# Patient Record
Sex: Male | Born: 1965 | Race: Black or African American | Hispanic: No | Smoking: Light tobacco smoker
Health system: Southern US, Community
[De-identification: ages and names within clinical notes are randomized; demographics above are authoritative.]

## PROBLEM LIST (undated history)

## (undated) DIAGNOSIS — D509 Iron deficiency anemia, unspecified: Secondary | ICD-10-CM

## (undated) DIAGNOSIS — Z87442 Personal history of urinary calculi: Secondary | ICD-10-CM

## (undated) DIAGNOSIS — M1990 Unspecified osteoarthritis, unspecified site: Secondary | ICD-10-CM

## (undated) HISTORY — PX: JOINT REPLACEMENT: SHX530

## (undated) HISTORY — PX: COLONOSCOPY: SHX174

## (undated) HISTORY — PX: COSMETIC SURGERY: SHX468

---

## 2011-05-04 HISTORY — PX: OTHER SURGICAL HISTORY: SHX169

## 2013-05-21 HISTORY — PX: LAPAROSCOPIC GASTRIC SLEEVE RESECTION: SHX5895

## 2014-11-28 ENCOUNTER — Telehealth: Payer: Self-pay

## 2014-11-28 ENCOUNTER — Ambulatory Visit (INDEPENDENT_AMBULATORY_CARE_PROVIDER_SITE_OTHER): Payer: Medicare (Managed Care) | Admitting: Sports Medicine

## 2014-11-28 ENCOUNTER — Encounter: Payer: Self-pay | Admitting: Sports Medicine

## 2014-11-28 ENCOUNTER — Ambulatory Visit (INDEPENDENT_AMBULATORY_CARE_PROVIDER_SITE_OTHER): Payer: Medicare (Managed Care)

## 2014-11-28 VITALS — BP 129/77 | HR 98 | Ht 70.0 in | Wt 241.0 lb

## 2014-11-28 DIAGNOSIS — M1711 Unilateral primary osteoarthritis, right knee: Secondary | ICD-10-CM | POA: Diagnosis not present

## 2014-11-28 DIAGNOSIS — S76112S Strain of left quadriceps muscle, fascia and tendon, sequela: Secondary | ICD-10-CM | POA: Diagnosis not present

## 2014-11-28 DIAGNOSIS — S76112A Strain of left quadriceps muscle, fascia and tendon, initial encounter: Secondary | ICD-10-CM | POA: Insufficient documentation

## 2014-11-28 DIAGNOSIS — M170 Bilateral primary osteoarthritis of knee: Secondary | ICD-10-CM

## 2014-11-28 DIAGNOSIS — S83142A Lateral subluxation of proximal end of tibia, left knee, initial encounter: Secondary | ICD-10-CM | POA: Diagnosis not present

## 2014-11-28 DIAGNOSIS — S83141A Lateral subluxation of proximal end of tibia, right knee, initial encounter: Secondary | ICD-10-CM | POA: Diagnosis not present

## 2014-11-28 DIAGNOSIS — M1712 Unilateral primary osteoarthritis, left knee: Secondary | ICD-10-CM | POA: Diagnosis not present

## 2014-11-28 MED ORDER — NAPROXEN-ESOMEPRAZOLE 500-20 MG PO TBEC: 1.0000 | DELAYED_RELEASE_TABLET | Freq: Two times a day (BID) | ORAL | Status: DC

## 2014-11-28 NOTE — Telephone Encounter (Signed)
We discussed this in the office, he told me that his personal physician had given him an acid blocker, so that he would be able to take pain medication. That exactly what this is, a combination of the 2. Does he still not want to take it?

## 2014-11-28 NOTE — Assessment & Plan Note (Signed)
Poor range of motion with approximately 30 of extension lag. Also with a great deal of hip extension lag. He will need aggressive formal physical therapy.

## 2014-11-28 NOTE — Assessment & Plan Note (Signed)
X-rays, bilateral aspiration and injection. We will try Vimovo. Physical therapy. Return in a month, consider viscous supplementation if no better. 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

## 2014-11-28 NOTE — Progress Notes (Signed)
Subjective:    I'm seeing this patient as a consultation for:  [facility]  CC: "knee pain"  HPI:  Patient presents with c/o bilateral knee pain, inability to straighten his left knee, and worsening mobility since his quadriceps tendon repair in 2012. After the surgery the patient remained wheel chair bound for years. The patient also reports that he had weighed over 500 lbs but traveled to [place] to undergo sleeve gastrectomy. He now goes to they gym and does water aerobics but he has never had formal physical therapy. He is unable to stand upright and uses a cane to walk with much difficulty. About 1 year ago he had cortisone injected into his knees [place], but this did not relieve his pain. He believes the only way to improve his symptoms is to undergo bilateral knee replacement. However he is amenable to attempting non-surgical management first because he is planning to ride his motorcycle across the country this summer.   Past medical history, Surgical history, Family history not pertinant except as noted below, Social history, Allergies, and medications have been entered into the medical record, reviewed, and no changes needed.   Review of Systems: No headache, visual changes, nausea, vomiting, diarrhea, constipation, dizziness, abdominal pain, skin rash, fevers, chills, night sweats, weight loss, swollen lymph nodes, body aches, positive joint swelling, no muscle aches, chest pain, shortness of breath, mood changes, visual or auditory hallucinations.   Objective:   General: Well Developed, well nourished, and in no acute distress.  Neuro/Psych: Alert and oriented x3, extra-ocular muscles intact, able to move all 4 extremities, sensation grossly intact. Skin: Warm and dry, no rashes noted.  Respiratory: Not using accessory muscles, speaking in full sentences, trachea midline.  Cardiovascular: Pulses palpable, no extremity edema. Abdomen: Does not  appear distended. MSK:  Patient unable to stand fully erect due to limited ROM in left hip. Left Knee: No erythema, a small effusion is palpable. No obvious bony abnormalities. Palpation normal with no warmth, joint line tenderness, patellar tenderness, or condyle tenderness. ROM full in flexion but extension limited to 30 degrees. Non painful patellar compression. Patella alta. Patellar glide with audible and palpable crepitus. Patellar tendon unremarkable. Quadriceps tendon with firm mass palpable. Hamstring strength 5+, quadriceps strength 4+ with marked wasting of the quadriceps on ultrasnography.  Right Knee: No erythema, a large effusion is palpable. No obvious bony abnormalities. Palpation normal with no warmth, joint line tenderness, patellar tenderness, or condyle tenderness. ROM full in flexion and extension and lower leg rotation. Non painful patellar compression. Patella alta. Patellar glide with audible and palpable crepitus. Patellar and quadriceps tendons unremarkable. Hamstring and quadriceps strength is normal.   Procedure: Real-time Ultrasound Guided aspiration/Injection of right knee Device: GE Logiq E  Verbal informed consent obtained.  Time-out conducted.  Noted no overlying erythema, induration, or other signs of local infection.  Skin prepped in a sterile fashion.  Local anesthesia: Topical Ethyl chloride.  With sterile technique and under real time ultrasound guidance:  Aspirated 65 mL of straw-colored fluid, syringe switched and 2 mL kenalog 40, 4 mL lidocaine injected easily. Completed without difficulty  Pain immediately resolved suggesting accurate placement of the medication.  Advised to call if fevers/chills, erythema, induration, drainage, or persistent bleeding.  Images permanently stored and available for review in the ultrasound unit.  Impression: Technically successful ultrasound guided injection.  Procedure: Real-time Ultrasound Guided  aspiration/Injection of left knee Device: GE Logiq E  Verbal informed  consent obtained.  Time-out conducted.  Noted no overlying erythema, induration, or other signs of local infection.  Skin prepped in a sterile fashion.  Local anesthesia: Topical Ethyl chloride.  With sterile technique and under real time ultrasound guidance:  Noted in intact patellar tendon with patella also, there is also significant atrophy with fatty replacement of the quadriceps, repair appeared intact. I was able to aspirate 5 mL of straw-colored fluid, syringe switched and 2 mL kenalog 40, 4 mL lidocaine injected easily. Completed without difficulty  Pain immediately resolved suggesting accurate placement of the medication.  Advised to call if fevers/chills, erythema, induration, drainage, or persistent bleeding.  Images permanently stored and available for review in the ultrasound unit.  Impression: Technically successful ultrasound guided injection.  X-rays were reviewed and show severe tricompartmental osteoarthritis with lateral subluxation of the tibia as expected.  Impression and Recommendations:   This case required medical decision making of moderate complexity.  # Osteoarthritis  - Bilateral ultrasound guided fluid aspiration and triamcinolone injection performed today - Begin naproxen/esomeprazole 200/50 mg daily for pain control - Bilateral XR Knees to be obtained today - Consider Orthovisc injection  # Left quadriceps tendon rupture - S/p repair in 2012 and prolonged immobilization in wheelchair resulting in limited ROM - Pt will require formal PT, referral placed  Follow up in 4 weeks or sooner if needed

## 2014-11-28 NOTE — Telephone Encounter (Signed)
Patient called stated that the medication he was prescribed today(Naproxen-Esomeprazole) he cannot take because he has had a gastric sleeve and it messes with his stomach patient would like to know if there is something else that can be sent in to his pharmacy. Rhonda Cunningham,CMA

## 2014-12-01 NOTE — Telephone Encounter (Signed)
Spoke to patient and he stated that he has [employer] insurance and they will not cover the medication this is what [facility] was [facility] from [facility], they advised patient to take OTC aleve and acid blocker. Suhailah Kwan,CMA

## 2014-12-01 NOTE — Telephone Encounter (Signed)
Left message on patient mvm concerning this and advised patient to call me back. Rhonda Cunningham,CMA

## 2014-12-02 ENCOUNTER — Ambulatory Visit: Payer: Medicare Other | Admitting: Sports Medicine

## 2014-12-09 ENCOUNTER — Ambulatory Visit (INDEPENDENT_AMBULATORY_CARE_PROVIDER_SITE_OTHER): Payer: Medicare (Managed Care) | Admitting: Physical Therapy

## 2014-12-09 ENCOUNTER — Encounter: Payer: Self-pay | Admitting: Physical Therapy

## 2014-12-09 ENCOUNTER — Ambulatory Visit: Payer: Medicare Other | Admitting: Sports Medicine

## 2014-12-09 DIAGNOSIS — R293 Abnormal posture: Secondary | ICD-10-CM

## 2014-12-09 DIAGNOSIS — R269 Unspecified abnormalities of gait and mobility: Secondary | ICD-10-CM

## 2014-12-09 DIAGNOSIS — M6281 Muscle weakness (generalized): Secondary | ICD-10-CM

## 2014-12-09 DIAGNOSIS — M549 Dorsalgia, unspecified: Secondary | ICD-10-CM

## 2014-12-09 DIAGNOSIS — M2560 Stiffness of unspecified joint, not elsewhere classified: Secondary | ICD-10-CM

## 2014-12-09 NOTE — Patient Instructions (Signed)
On Elbows (Prone)   Rise up on elbows as high as possible, keeping hips on floor. Hold _3-10___ min. Repeat _1___ times per set. Do _1___ sets per session. Do _2___ sessions per day.  http://orth.exer.us/92   Copyright  VHI. All rights reserved.  Postural re-alignement routine per the [facility] method   Press upper body upward, keeping hips in contact with floor. Keep lower back and buttocks relaxed. Hold 30sec. Repeat _1___ times per set. Do _1___ sets per session. Do __2__ sessions per day  http://orth.exer.us/94   Copyright  VHI. All rights reserved.

## 2014-12-09 NOTE — Patient Instructions (Signed)
On Elbows (Prone)   Rise up on elbows as high as possible, keeping hips on floor. Hold _3-10___ min. Repeat _1___ times per set. Do _1___ sets per session. Do _2___ sessions per day.  http://orth.exer.us/92   Copyright  VHI. All rights reserved.  Postural re-alignement routine per the [facility] method   Press upper body upward, keeping hips in contact with floor. Keep lower back and buttocks relaxed. Hold 30sec. Repeat _1___ times per set. Do _1___ sets per session. Do __2__ sessions per day  http://orth.exer.us/94   Copyright  VHI. All rights reserved.

## 2014-12-09 NOTE — Therapy (Signed)
[facility] [place] [address] [place], [place], [address] Phone: [phone]   Fax:  [phone]  Physical Therapy Evaluation  Patient Details  Name: Jordan Bailey MRN: [id number] Date of Birth: 1965-10-17 Referring Provider:  Monica Becton,*  Encounter Date: 12/09/2014      PT End of Session - 12/09/14 1110    Visit Number 1   Number of Visits 16   Date for PT Re-Evaluation 02/03/15   PT Start Time 1020   PT Stop Time 1124   PT Time Calculation (min) 64 min   Activity Tolerance Patient tolerated treatment well      History reviewed. No pertinent past medical history.  Past Surgical History  Procedure Laterality Date  . 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 Reattached tendon Left 05/2011    LEFT LEG    There were no vitals taken for this visit.  Visit Diagnosis:  Abnormality of gait - Plan: PT plan of care cert/re-cert  Abnormal posture - Plan: PT plan of care cert/re-cert  Muscle weakness-general - Plan: PT plan of care cert/re-cert  Stiffness of multiple joints - Plan: PT plan of care cert/re-cert  Back pain, unspecified location - Plan: PT plan of care cert/re-cert      Subjective Assessment - 12/09/14 1020    Symptoms bilat knees are bone/bone, had a ruptured Lt quad tendon in 2012, had a repair 3 months later after retracted.  Never had PT after this surgery.  rides stationary bike, goes to the gym.    Pertinent History Had a lot of fluid removed from Rt knee.  He has been having issues since 2012 with injury. Was in [facility] for about 1-2 yrs, Weaned himself to using a cane.     How long can you sit comfortably? unlimited   Patient Stated Goals unable to squat, wishes to increase strength in his legs. Wants to target muscles that are atrophied to stand up straight and wishes to get rid of cane.    Currently in Pain? Yes  not real pain in his knees however the pain comes from his back due  to postural limitations.   Pain Score 2    Pain Location Back   Pain Descriptors / Indicators Aching;Dull   Pain Onset More than a month ago   Pain Frequency Intermittent   Aggravating Factors  standing and walkling, works  through the pain          [facility] PT Assessment - 12/09/14 0001    Assessment   Medical Diagnosis [name]   Onset Date 09/22/15   Next MD Visit 12/09/13   Prior Therapy no   Precautions   Precautions None   Balance Screen   Has the patient fallen in the past 6 months No   Has the patient had a decrease in activity level because of a fear of falling?  No   Is the patient reluctant to leave their home because of a fear of falling?  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    Observation/Other Assessments   Focus on Therapeutic Outcomes (FOTO)  51%   Posture/Postural Control   Posture/Postural Control Postural limitations   Postural Limitations Flexed trunk;Weight shift right;Rounded Shoulders;Forward head;Left pelvic obliquity;Increased  lumbar lordosis   ROM / Strength   AROM / PROM / Strength AROM;PROM;Strength   AROM   AROM Assessment Site Ankle;Knee;Hip   Right/Left Hip --  bilat hips tight in the hip flexors & single knee to chest   Right/Left Knee Left   Left Knee Extension -12   Left Knee Flexion 109   Right/Left Ankle --  bilateral ankle inversion to neutral, others WNL   PROM   PROM Assessment Site Hip;Knee   Right/Left Hip Right;Left   Right Hip External Rotation  30   Right Hip Internal Rotation  25   Left Hip External Rotation  35   Left Hip Internal Rotation  5   Right/Left Knee Left   Left Knee Extension -9   Left Knee Flexion 111   Strength   Overall Strength Comments multifidis poor,  transverse abdominis poor   Strength Assessment Site Hip   Right/Left Hip Right;Left   Right Hip Flexion 4+/5   Right Hip Extension 4-/5   Right Hip ABduction 4/5   Left Hip Flexion 4-/5   Left Hip Extension 3+/5   Left Hip ABduction 3+/5   Flexibility   Soft Tissue Assessment /Muscle Length yes   Palpation   Palpation tightness and scar tissue palpable in Lt quad   Ambulation/Gait   Ambulation/Gait Yes   Ambulation/Gait Assistance 6: Modified independent (Device/Increase time)   Assistive device Straight cane   Gait Pattern Decreased step length - left;Decreased stance time - left;Decreased stride length;Left circumduction;Left hip hike;Left flexed knee in stance;Antalgic;Lateral hip instability;Decreased trunk rotation;Trunk flexed                  [facility] Adult PT Treatment/Exercise - 12/09/14 0001    Exercises   Exercises Lumbar   Lumbar Exercises: Supine   Other Supine Lumbar Exercises postural re-alignment routine    Lumbar Exercises: Prone   Other Prone Lumbar Exercises lying prone for hip flex stetch, progress to POE, and press ups                PT Education - 12/09/14 1106    Education provided Yes   Education Details HEP   Person(s) Educated Patient   Methods Explanation;Demonstration;Handout   Comprehension Returned demonstration;Verbal cues required;Tactile cues required          PT Short Term Goals - 12/09/14 1142    PT SHORT TERM GOAL #1   Title I with initial HEP   Time 4   Period Weeks   Status New   PT SHORT TERM GOAL #2   Title increase Lt hip ROM to WNL    Time 4   Period Weeks   Status New   PT SHORT TERM GOAL #3   Title increase Rt hip extension to WNL   Time 4   Period Weeks   Status New   PT SHORT TERM GOAL #4   Title ambulate keeping the Lt LE straight and not need the cane   Time 4   Period Weeks   Status New   PT SHORT TERM GOAL #5   Title improve FOTO =/< 30%    Time 4   Period Weeks   Status New            PT Long Term Goals - 12/09/14 1144    PT LONG TERM GOAL #1   Title I with advanced HEP to include core work   Time 8   Period Weeks   Status New  PT LONG TERM GOAL #2   Title demonstrate bilat hip strength =/> 4+/5   Time 8   Period Weeks   Status New   PT LONG TERM GOAL #3   Title report back pain decrease =/> 50% with daily activity   Time 8   Period Weeks   Status New   PT LONG TERM GOAL #4   Title ambulate with upright posture    Time 8   Period Weeks   Status New   PT LONG TERM GOAL #5   Title improve FOTO =/< 21%    Time 8   Period Weeks   Status New   Additional Long Term Goals   Additional Long Term Goals Yes   PT LONG TERM GOAL #6   Title perform lifting up to 15# waist to head level without difficulty   Time 8   Period Weeks   Status New               Plan - 12/09/14 1116    Clinical Impression Statement Patient presents with multiple postural  changes and gait abnormalities, has weakness and tightness in his core and hips that are causing gait abnormalities   Pt will benefit from skilled therapeutic intervention in order to improve on the following deficits Abnormal gait;Difficulty walking;Impaired flexibility;Improper body mechanics;Postural dysfunction;Pain;Decreased balance;Decreased strength   Rehab Potential Good   PT Frequency 2x / week   PT Duration 8 weeks   PT Treatment/Interventions Moist Heat;Therapeutic activities;Patient/family education;Passive range of motion;Therapeutic exercise;DME Instruction;Ultrasound;Gait training;Balance training;Manual techniques;Stair training;Cryotherapy;Electrical Stimulation;Functional mobility training   PT Next Visit Plan assess tolerance to HEP, manual work to Lt hip and knee, postural re-alignment exercise   Consulted and Agree with Plan of Care Patient         Problem List Patient Active Problem List   Diagnosis Date Noted  . Osteoarthritis of both knees 11/28/2014  . Rupture of left  quadriceps muscle post repair 11/28/2014    Roderic ScarceSusan Calea Hribar. PT 12/09/2014, 11:50 AM  [facility] [address] [place], [place], [address] Phone: [phone]   Fax:  [phone]

## 2014-12-11 ENCOUNTER — Ambulatory Visit (INDEPENDENT_AMBULATORY_CARE_PROVIDER_SITE_OTHER): Payer: Medicare (Managed Care) | Admitting: Physical Therapy

## 2014-12-11 ENCOUNTER — Encounter: Payer: Self-pay | Admitting: Sports Medicine

## 2014-12-11 ENCOUNTER — Ambulatory Visit (INDEPENDENT_AMBULATORY_CARE_PROVIDER_SITE_OTHER): Payer: Medicare (Managed Care) | Admitting: Sports Medicine

## 2014-12-11 VITALS — BP 128/80 | HR 78 | Ht 71.0 in | Wt 238.0 lb

## 2014-12-11 DIAGNOSIS — M2042 Other hammer toe(s) (acquired), left foot: Secondary | ICD-10-CM

## 2014-12-11 DIAGNOSIS — M2560 Stiffness of unspecified joint, not elsewhere classified: Secondary | ICD-10-CM | POA: Diagnosis not present

## 2014-12-11 DIAGNOSIS — M1612 Unilateral primary osteoarthritis, left hip: Secondary | ICD-10-CM

## 2014-12-11 DIAGNOSIS — R293 Abnormal posture: Secondary | ICD-10-CM

## 2014-12-11 DIAGNOSIS — R269 Unspecified abnormalities of gait and mobility: Secondary | ICD-10-CM

## 2014-12-11 DIAGNOSIS — M6281 Muscle weakness (generalized): Secondary | ICD-10-CM

## 2014-12-11 DIAGNOSIS — M170 Bilateral primary osteoarthritis of knee: Secondary | ICD-10-CM | POA: Diagnosis not present

## 2014-12-11 DIAGNOSIS — M549 Dorsalgia, unspecified: Secondary | ICD-10-CM

## 2014-12-11 MED ORDER — NAPROXEN 500 MG PO TABS: 500.0000 mg | ORAL_TABLET | Freq: Two times a day (BID) | ORAL | Status: DC

## 2014-12-11 MED ORDER — OMEPRAZOLE 40 MG PO CPDR: 40.0000 mg | DELAYED_RELEASE_CAPSULE | Freq: Two times a day (BID) | ORAL | Status: DC

## 2014-12-11 NOTE — Therapy (Signed)
[facility] [address] [place], [place], [address] Phone: [phone]   Fax:  [phone]  Physical Therapy Treatment  Patient Details  Name: Jordan Bailey MRN: [id number] Date of Birth: 03/12/1966 Referring Provider:  Monica Bectonhekkekandam, Thomas J,*  Encounter Date: 12/11/2014      PT End of Session - 12/11/14 1245    Visit Number 2   Number of Visits 16   Date for PT Re-Evaluation 02/03/15   PT Start Time 1150   PT Stop Time 1230   PT Time Calculation (min) 40 min   Activity Tolerance Patient tolerated treatment well   Behavior During Therapy [facility] for tasks assessed/performed      No past medical history on file.  Past Surgical History  Procedure Laterality Date  . 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 Reattached tendon Left 05/2011    LEFT LEG    There were no vitals filed for this visit.  Visit Diagnosis:  Abnormality of gait  Abnormal posture  Stiffness of multiple joints  Muscle weakness-general  Back pain, unspecified location      Subjective Assessment - 12/11/14 1153    Symptoms feels good today                       [facility] Adult PT Treatment/Exercise - 12/11/14 1155    Lumbar Exercises: Stretches   Prone on Elbows Stretch 3 reps;60 seconds   Press Ups Other (comment)   Press Ups Limitations 10 reps x 5 sec; cues to keep hips on mat for hip flexor stretch   Knee/Hip Exercises: Stretches   Passive Hamstring Stretch 3 reps;30 seconds  with strap   Knee/Hip Exercises: Aerobic   Stationary Bike NuStep Level 4 x 5 min   Knee/Hip Exercises: Supine   Quad Sets Left;15 reps   Quad Sets Limitations with heel prop and mod cues for quad activation   Manual Therapy   Manual Therapy Joint mobilization   Joint Mobilization L femur A/P glides grades 2-3 for knee ROM; hip internal rotation for er stretch; and L hip distraction                 PT Education - 12/11/14  1245    Education provided Yes   Education Details HEP   Person(s) Educated Patient   Methods Explanation;Demonstration;Handout;Tactile cues;Verbal cues   Comprehension Verbalized understanding;Returned demonstration;Need further instruction          PT Short Term Goals - 12/11/14 1247    PT SHORT TERM GOAL #1   Title I with initial HEP   Time 4   Period Weeks   Status On-going   PT SHORT TERM GOAL #2   Title increase Lt hip ROM to WNL    Time 4   Period Weeks   Status On-going   PT SHORT TERM GOAL #3   Title increase Rt hip extension to WNL   Time 4   Period Weeks   Status On-going   PT SHORT TERM GOAL #4   Title ambulate keeping the Lt LE straight and not need the cane   Time 4   Period Weeks   Status On-going   PT SHORT TERM GOAL #5   Title improve FOTO =/< 30%    Time 4   Period Weeks   Status On-going           PT Long Term Goals - 12/11/14 1247    PT LONG TERM GOAL #  1   Title I with advanced HEP to include core work   Time 8   Period Weeks   Status On-going   PT LONG TERM GOAL #2   Title demonstrate bilat hip strength =/> 4+/5   Time 8   Period Weeks   Status On-going   PT LONG TERM GOAL #3   Title report back pain decrease =/> 50% with daily activity   Time 8   Period Weeks   Status On-going   PT LONG TERM GOAL #4   Title ambulate with upright posture    Time 8   Period Weeks   Status On-going   PT LONG TERM GOAL #5   Title improve FOTO =/< 21%    Time 8   Period Weeks   Status On-going   PT LONG TERM GOAL #6   Title perform lifting up to 15# waist to head level without difficulty   Time 8   Period Weeks   Status On-going               Plan - 12/11/14 1246    Clinical Impression Statement Pt reports not doing HEP provided at last visit.  Added additional stretches and quad sets to HEP and reinforced need for compliance.     PT Next Visit Plan review new HEP; continue manual and strengthening   Consulted and Agree with Plan  of Care Patient        Problem List Patient Active Problem List   Diagnosis Date Noted  . Osteoarthritis of both knees 11/28/2014  . Rupture of left quadriceps muscle post repair 11/28/2014   Clarita Crane, PT, DPT 12/11/2014 12:49 PM  [facility] [address] [place], [place], [address] Phone: [phone]   Fax:  [phone]

## 2014-12-11 NOTE — Assessment & Plan Note (Signed)
With rigid flexion deformity at the left second proximal interphalangeal joint. Referral to podiatry, he is getting ulceration on the dorsal aspect.

## 2014-12-11 NOTE — Patient Instructions (Signed)
Hamstring Step 1   Straighten left knee. Keep knee level with other knee or on bolster. Hold _30__ seconds. Relax knee by returning foot to start. Repeat _2-3_ times.  Copyright  VHI. All rights reserved.    Internal Rotation: ROM In Extension (Supine)   Position (A) Helper: Place hands on top of left leg. Motion (B) - Roll entire leg inward toward center of body. - Knee remains straight. Repeat _5-10__ times. Repeat with other leg. Do _2-3__ sessions per day. Variation: Patient is passive.  Copyright  VHI. All rights reserved.    ROM: Heel Prop   Lie with pillow under right heel. Tighten the muscles on the top of leg while trying to push knee toward the floor. Hold __5__ seconds. Repeat _10___ times. Do _2-3___ sessions per day.  http://gt2.exer.us/287   Copyright  VHI. All rights reserved.   Clarita CraneStephanie F Krystal Teachey, PT, DPT 12/11/2014 12:20 PM  [facility]  [address] [place], [place] [address]  [phone] (office) [phone] (fax)

## 2014-12-11 NOTE — Assessment & Plan Note (Signed)
We can inject this in the future if needed.

## 2014-12-11 NOTE — Assessment & Plan Note (Signed)
Overall doing well with physical therapy and gaining range of motion. Unable to obtain Vimovo, we are going to simply switch this to naproxen and omeprazole as separate prescriptions.

## 2014-12-11 NOTE — Progress Notes (Signed)
  Subjective:    CC: Follow-up  HPI: Bilateral knee osteoarthritis: Pain-free, working on his range of motion and strength with physical therapy.  Left hip pain: Localized in the groin, worse after periods of inactivity, moderate, persistent, does not desire interventional treatment today.  Hammertoe: Left second, present for years, getting ulceration on the dorsum of the proximal interphalangeal joint, wonders what can be done.  Past medical history, Surgical history, Family history not pertinant except as noted below, Social history, Allergies, and medications have been entered into the medical record, reviewed, and no changes needed.   Review of Systems: No fevers, chills, night sweats, weight loss, chest pain, or shortness of breath.   Objective:    General: Well Developed, well nourished, and in no acute distress.  Neuro: Alert and oriented x3, extra-ocular muscles intact, sensation grossly intact.  HEENT: Normocephalic, atraumatic, pupils equal round reactive to light, neck supple, no masses, no lymphadenopathy, thyroid nonpalpable.  Skin: Warm and dry, no rashes. Cardiac: Regular rate and rhythm, no murmurs rubs or gallops, no lower extremity edema.  Respiratory: Clear to auscultation bilaterally. Not using accessory muscles, speaking in full sentences. Left Hip: ROM IR: 20 Deg, ER: 60 Deg, Flexion: 120 Deg, Extension: 100 Deg, Abduction: 45 Deg, Adduction: 45 Deg. Pain with Internal rotation. Strength IR: 5/5, ER: 5/5, Flexion: 5/5, Extension: 5/5, Abduction: 5/5, Adduction: 5/5 Pelvic alignment unremarkable to inspection and palpation. Standing hip rotation and gait without trendelenburg / unsteadiness. Greater trochanter without tenderness to palpation. No tenderness over piriformis. No SI joint tenderness and normal minimal SI movement. Left Foot: No visible erythema or swelling. Range of motion is full in all directions. Strength is 5/5 in all directions. No hallux  valgus. Minimal pes planus Abnormal callus noted on the dorsum of the left second proximal interphalangeal joint due to a hammertoe, hammertoe is rigid, and I am unable to passively extend at the PIP. No pain over the navicular prominence, or base of fifth metatarsal. No tenderness to palpation of the calcaneal insertion of plantar fascia. No pain at the Achilles insertion. No pain over the calcaneal bursa. No pain of the retrocalcaneal bursa. No hallux rigidus or limitus. No pain with compression of the metatarsal heads. Neurovascularly intact distally.  Impression and Recommendations:

## 2014-12-15 ENCOUNTER — Ambulatory Visit (INDEPENDENT_AMBULATORY_CARE_PROVIDER_SITE_OTHER): Payer: Medicare (Managed Care) | Admitting: Physical Therapy

## 2014-12-15 DIAGNOSIS — R269 Unspecified abnormalities of gait and mobility: Secondary | ICD-10-CM | POA: Diagnosis not present

## 2014-12-15 DIAGNOSIS — M2560 Stiffness of unspecified joint, not elsewhere classified: Secondary | ICD-10-CM

## 2014-12-15 DIAGNOSIS — M6281 Muscle weakness (generalized): Secondary | ICD-10-CM

## 2014-12-15 DIAGNOSIS — R293 Abnormal posture: Secondary | ICD-10-CM

## 2014-12-15 NOTE — Patient Instructions (Signed)
Pt issued paper copy of Knee extension stretches and knee flexion stretches.   Pt is to perform prone knee flexion with strap (to stretch hip and knee) 30 sec, 2 sets, 2x/day.   Pt can also try prone knee hang, quad sets with foot propped on chair to encourage increased extension.

## 2014-12-15 NOTE — Therapy (Signed)
[place], [place], [address] Phone: [phone]   Fax:  [phone]  Physical Therapy Treatment  Patient Details  Name: Jordan Bailey MRN: [id number] Date of Birth: May 14, 1966 Referring Provider:  Silverio Decamp,*  Encounter Date: 12/15/2014      PT End of Session - 12/15/14 1201    Visit Number 3   Number of Visits 16   Date for PT Re-Evaluation 02/03/15   PT Start Time 1200   PT Stop Time 1250   PT Time Calculation (min) 50 min   Activity Tolerance Patient tolerated treatment well      No past medical history on file.  Past Surgical History  Procedure Laterality Date  . 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 Reattached tendon Left 05/2011    LEFT LEG    There were no vitals filed for this visit.  Visit Diagnosis:  Abnormality of gait  Abnormal posture  Stiffness of multiple joints  Muscle weakness-general      Subjective Assessment - 12/15/14 1202    Symptoms Nothing new to report.  Pt reported doing the exercises each day (HS stretch, QS, press ups).    Currently in Pain? No/denies            [facility] PT Assessment - 12/15/14 0001    Assessment   Medical Diagnosis [name]   Onset Date 09/22/15   Next MD Visit 12/25/14   Precautions   Precautions None                   [facility] Adult PT Treatment/Exercise - 12/15/14 0001    Ambulation/Gait   Ambulation/Gait Yes   Ambulation/Gait Assistance 6: Modified independent (Device/Increase time)   Ambulation Distance (Feet) 160 Feet   Assistive device Straight cane   Gait Pattern Step-through pattern;Decreased step length - right;Left flexed knee in stance;Antalgic;Trunk flexed   Gait Comments VC to even step length and increase upright posture with Lt stance. Tactile cues to increase upright posture.   Exercises   Exercises Lumbar;Knee/Hip   Lumbar Exercises: Stretches   Passive  Hamstring Stretch 2 reps;30 seconds   Hip Flexor Stretch 5 reps;30 seconds  3 reps standing, hips forward; 2 reps prone   Hip Flexor Stretch Limitations L knee to 90 deg    Press Ups 2 reps;10 seconds   Lumbar Exercises: Aerobic   Stationary Bike NuStep level 5- x 5 min   Lumbar Exercises: Standing   Heel Raises 15 reps  Rocking toes up/heels up   Other Standing Lumbar Exercises --   Lumbar Exercises: Seated   Long Arc Quad on Chair Strengthening;Left;15 reps  eccentric control 4 sec   LAQ on Chair Limitations not full ext.    Lumbar Exercises: Prone   Other Prone Lumbar Exercises Prone on elbows x 1 min.    Other Prone Lumbar Exercises --   Knee/Hip Exercises: Stretches   Gastroc Stretch 2 reps;30 seconds   Knee/Hip Exercises: Standing   Other Standing Knee Exercises sit to stand with weight shifting Lt with single UE assistance x 15 reps   Other Standing Knee Exercises Weight shifts R<>L; up to 75% weight into LLE (scale under Lt foot to measure) x 15. Tactile cues for knee ext and upright posture in Lt stance.    Knee/Hip Exercises: Prone   Prone Knee Hang 2 minutes   Prone Knee Hang Weights (lbs) --  Opposite leg on top   Other Prone  Exercises TKE with 5 sec holds x 12 reps                 PT Education - 12/15/14 1253    Education provided Yes   Education Details HEP    Person(s) Educated Patient   Methods Explanation;Demonstration;Handout   Comprehension Verbalized understanding;Returned demonstration          PT Short Term Goals - 12/11/14 1247    PT SHORT TERM GOAL #1   Title I with initial HEP   Time 4   Period Weeks   Status On-going   PT SHORT TERM GOAL #2   Title increase Lt hip ROM to WNL    Time 4   Period Weeks   Status On-going   PT SHORT TERM GOAL #3   Title increase Rt hip extension to WNL   Time 4   Period Weeks   Status On-going   PT SHORT TERM GOAL #4   Title ambulate keeping the Lt LE straight and not need the cane   Time 4    Period Weeks   Status On-going   PT SHORT TERM GOAL #5   Title improve FOTO =/< 30%    Time 4   Period Weeks   Status On-going           PT Long Term Goals - 12/11/14 1247    PT LONG TERM GOAL #1   Title I with advanced HEP to include core work   Time 8   Period Weeks   Status On-going   PT LONG TERM GOAL #2   Title demonstrate bilat hip strength =/> 4+/5   Time 8   Period Weeks   Status On-going   PT LONG TERM GOAL #3   Title report back pain decrease =/> 50% with daily activity   Time 8   Period Weeks   Status On-going   PT LONG TERM GOAL #4   Title ambulate with upright posture    Time 8   Period Weeks   Status On-going   PT LONG TERM GOAL #5   Title improve FOTO =/< 21%    Time 8   Period Weeks   Status On-going   PT LONG TERM GOAL #6   Title perform lifting up to 15# waist to head level without difficulty   Time 8   Period Weeks   Status On-going               Plan - 12/15/14 1459    Clinical Impression Statement Pt reports increased compliance with HEP. Pt tolerated all exercises well and no increase in symptoms. Pt continues with poor Lt quad contraction,decreased Lt knee ROM, and tight Lt hip flexor. No goals met secondary to number of visits.    Pt will benefit from skilled therapeutic intervention in order to improve on the following deficits Abnormal gait;Difficulty walking;Impaired flexibility;Improper body mechanics;Postural dysfunction;Pain;Decreased balance;Decreased strength   Rehab Potential Good   PT Frequency 2x / week   PT Duration 8 weeks   PT Treatment/Interventions Moist Heat;Therapeutic activities;Patient/family education;Passive range of motion;Therapeutic exercise;DME Instruction;Ultrasound;Gait training;Balance training;Manual techniques;Stair training;Cryotherapy;Electrical Stimulation;Functional mobility training   PT Next Visit Plan Continue per plan of care.    Consulted and Agree with Plan of Care Patient         Problem List Patient Active Problem List   Diagnosis Date Noted  . Hammer toe of left foot 12/11/2014  . Osteoarthritis of left hip 12/11/2014  . Osteoarthritis of  both knees 11/28/2014  . Rupture of left quadriceps muscle post repair 11/28/2014   Kerin Perna, PTA 12/15/2014 3:03 PM  [place], [place], [address] Phone: [phone]   Fax:  [phone]

## 2014-12-18 ENCOUNTER — Ambulatory Visit (INDEPENDENT_AMBULATORY_CARE_PROVIDER_SITE_OTHER): Payer: Medicare (Managed Care) | Admitting: Physical Therapy

## 2014-12-18 DIAGNOSIS — R293 Abnormal posture: Secondary | ICD-10-CM

## 2014-12-18 DIAGNOSIS — R269 Unspecified abnormalities of gait and mobility: Secondary | ICD-10-CM | POA: Diagnosis not present

## 2014-12-18 DIAGNOSIS — M2560 Stiffness of unspecified joint, not elsewhere classified: Secondary | ICD-10-CM

## 2014-12-18 DIAGNOSIS — M6281 Muscle weakness (generalized): Secondary | ICD-10-CM

## 2014-12-18 DIAGNOSIS — M549 Dorsalgia, unspecified: Secondary | ICD-10-CM | POA: Diagnosis not present

## 2014-12-18 NOTE — Therapy (Signed)
[facility] [place] [address] [place], [place], [address] Phone: [phone]   Fax:  [phone]  Physical Therapy Treatment  Patient Details  Name: Jordan Bailey MRN: [id number] Date of Birth: [birth year] Referring Provider:  Monica Becton,*  Encounter Date: 12/18/2014      PT End of Session - 12/18/14 1153    Visit Number 4   Number of Visits 16   Date for PT Re-Evaluation 02/03/15   PT Start Time 1150   PT Stop Time 1240   PT Time Calculation (min) 50 min      No past medical history on file.  Past Surgical History  Procedure Laterality Date  . 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 Reattached tendon Left 05/2011    LEFT LEG    There were no vitals filed for this visit.  Visit Diagnosis:  Muscle weakness-general  Abnormality of gait  Stiffness of multiple joints  Back pain, unspecified location  Abnormal posture      Subjective Assessment - 12/18/14 1153    Symptoms Pt reports starting to feel a little more pain in knees lately   Currently in Pain? Yes   Pain Score 2    Pain Location Knee   Pain Orientation --  bilateral    Pain Descriptors / Indicators Dull   Aggravating Factors  standing and walking    Pain Relieving Factors rest            [facility] PT Assessment - 12/18/14 0001    Assessment   Medical Diagnosis [name]   Onset Date 09/22/15   Next MD Visit 12/25/14   Precautions   Precautions None                   [facility] Adult PT Treatment/Exercise - 12/18/14 0001    Exercises   Exercises Lumbar;Knee/Hip   Lumbar Exercises: Stretches   Prone on Elbows Stretch 3 reps;60 seconds   Lumbar Exercises: Standing   Forward Lunge --  with foot on 13"step to stretch hip flexor x 2 reps. Lt knee buckled 1x with stance.    Lumbar Exercises: Sidelying   Clam 10 reps bilat   Hip Abduction 15 reps bilat.  Difficult on Lt side.    Lumbar Exercises: Prone   Straight Leg Raise 15 reps  very challenging   Knee/Hip Exercises: Stretches   Quad Stretch 3 reps;60 seconds  LLE   ITB Stretch 2 reps;60 seconds   Gastroc Stretch 2 reps;20 seconds   Knee/Hip Exercises: Aerobic   Stationary Bike NuStep level 4- 5 min    Knee/Hip Exercises: Standing   Heel Raises 15 reps. VC to decrease UE assist.    Terminal Knee Extension Strengthening;Left;1 set;15 reps   Theraband Level (Terminal Knee Extension) Level 3 (Green)   Other Standing Knee Exercises weight shifts Lt to Rt VC to tighten Lt thigh. Tactile cues to truck for upright posture.                   PT Short Term Goals - 12/11/14 1247    PT SHORT TERM GOAL #1   Title I with initial HEP   Time 4   Period Weeks   Status On-going   PT SHORT TERM GOAL #2   Title increase Lt hip ROM to WNL    Time 4   Period Weeks   Status On-going   PT SHORT TERM GOAL #3   Title increase Rt hip  extension to WNL   Time 4   Period Weeks   Status On-going   PT SHORT TERM GOAL #4   Title ambulate keeping the Lt LE straight and not need the cane   Time 4   Period Weeks   Status On-going   PT SHORT TERM GOAL #5   Title improve FOTO =/< 30%    Time 4   Period Weeks   Status On-going           PT Long Term Goals - 12/11/14 1247    PT LONG TERM GOAL #1   Title I with advanced HEP to include core work   Time 8   Period Weeks   Status On-going   PT LONG TERM GOAL #2   Title demonstrate bilat hip strength =/> 4+/5   Time 8   Period Weeks   Status On-going   PT LONG TERM GOAL #3   Title report back pain decrease =/> 50% with daily activity   Time 8   Period Weeks   Status On-going   PT LONG TERM GOAL #4   Title ambulate with upright posture    Time 8   Period Weeks   Status On-going   PT LONG TERM GOAL #5   Title improve FOTO =/< 21%    Time 8   Period Weeks   Status On-going   PT LONG TERM GOAL #6   Title perform lifting up to 15# waist to head level without difficulty   Time  8   Period Weeks   Status On-going               Plan - 12/18/14 1255    Clinical Impression Statement Pt tolerated new exercises well. Pt very challenged with SLR in hip ext and hip abd bilaterally. Making slow progress towards goals. Pt remains motivated to progress towards established goals.    Pt will benefit from skilled therapeutic intervention in order to improve on the following deficits Abnormal gait;Difficulty walking;Impaired flexibility;Improper body mechanics;Postural dysfunction;Pain;Decreased balance;Decreased strength   Rehab Potential Good   PT Frequency 2x / week   PT Duration 8 weeks   PT Treatment/Interventions Moist Heat;Therapeutic activities;Patient/family education;Passive range of motion;Therapeutic exercise;DME Instruction;Ultrasound;Gait training;Balance training;Manual techniques;Stair training;Cryotherapy;Electrical Stimulation;Functional mobility training   PT Next Visit Plan Progress HEP, continue per plan of care.    Consulted and Agree with Plan of Care Patient        Problem List Patient Active Problem List   Diagnosis Date Noted  . Hammer toe of left foot 12/11/2014  . Osteoarthritis of left hip 12/11/2014  . Osteoarthritis of both knees 11/28/2014  . Rupture of left quadriceps muscle post repair 11/28/2014    Mayer CamelJennifer Carlson-Long, PTA 12/18/2014 1:00 PM  [facility] [place] [address] [place], [place], [address] Phone: [phone]   Fax:  [phone]

## 2014-12-22 ENCOUNTER — Encounter: Payer: Self-pay | Admitting: Physical Therapy

## 2014-12-22 ENCOUNTER — Ambulatory Visit (INDEPENDENT_AMBULATORY_CARE_PROVIDER_SITE_OTHER): Payer: Medicare (Managed Care) | Admitting: Physical Therapy

## 2014-12-22 DIAGNOSIS — R293 Abnormal posture: Secondary | ICD-10-CM

## 2014-12-22 DIAGNOSIS — M2560 Stiffness of unspecified joint, not elsewhere classified: Secondary | ICD-10-CM | POA: Diagnosis not present

## 2014-12-22 DIAGNOSIS — M6281 Muscle weakness (generalized): Secondary | ICD-10-CM

## 2014-12-22 NOTE — Therapy (Signed)
[facility] [place] [address] [place], [place], [address] Phone: [phone]   Fax:  [phone]  Physical Therapy Treatment  Patient Details  Name: Jordan Bailey MRN: [id number] Date of Birth: 01/04/1966 Referring Provider:  Monica Bectonhekkekandam, Thomas J,*  Encounter Date: 12/22/2014      PT End of Session - 12/22/14 1234    Visit Number 5   Number of Visits 16   Date for PT Re-Evaluation 02/03/15   PT Start Time 1150   PT Stop Time 1234   PT Time Calculation (min) 44 min   Activity Tolerance Patient tolerated treatment well      History reviewed. No pertinent past medical history.  Past Surgical History  Procedure Laterality Date  . 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 Reattached tendon Left 05/2011    LEFT LEG    There were no vitals filed for this visit.  Visit Diagnosis:  Muscle weakness-general  Stiffness of multiple joints  Abnormal posture      Subjective Assessment - 12/22/14 1151    Symptoms Pt reports he can tell PT is helping he was able to get his socks on easier and he is trying to keep his feet straighter with walking, has incresaed pain in stairs doing this though   Patient Stated Goals unable to squat, wishes to increase strength in his legs. Wants to target muscles that are atrophied to stand up straight and wishes to get rid of cane.    Currently in Pain? No/denies                       [facility] - 12/22/14 0001    Lumbar Exercises: Aerobic   Stationary Bike NuStep level 5- x 5 min   Knee/Hip Exercises: Stretches   Gastroc Stretch 30 seconds  then prostretch x 30 secs bilat   Gastroc Stretch Limitations ver difficult wiht Lt side on prostretch.    Knee/Hip Exercises: Aerobic   Stationary Bike NuStep level 5- 5 min    Knee/Hip Exercises: Standing   Step Down Step Height: 2";10 reps;3 sets  leading with Lt LE, bilat HHA assist VC's for form    SLS on Lt as tolerates with HHA PRN.    Other Standing Knee Exercises standing at high table, bringing hips to mat and back, VCs to keep weight even on each foot.    Knee/Hip Exercises: Supine   Other Supine Knee Exercises leg lengtheners,x 15.    Manual Therapy   Manual Therapy Joint mobilization;Myofascial release;Passive ROM   Joint Mobilization Lt hip, PA mobs grade III, IR mobs grade III manual traction of bilat hips.    Passive ROM IR Lt hip in prone                  PT Short Term Goals - 12/22/14 1243    PT SHORT TERM GOAL #1   Title I with initial HEP   Status Achieved   PT SHORT TERM GOAL #2   Title increase Lt hip ROM to WNL    Status On-going   PT SHORT TERM GOAL #3   Title increase Rt hip extension to WNL   Status On-going   PT SHORT TERM GOAL #4   Title ambulate keeping the Lt LE straight and not need the cane   Status On-going   PT SHORT TERM GOAL #5   Title improve FOTO =/< 30%    Status On-going  PT Long Term Goals - 12/22/14 1244    PT LONG TERM GOAL #1   Title I with advanced HEP to include core work   Status On-going   PT LONG TERM GOAL #2   Title demonstrate bilat hip strength =/> 4+/5   Status On-going   PT LONG TERM GOAL #3   Title report back pain decrease =/> 50% with daily activity   Status On-going   PT LONG TERM GOAL #4   Title ambulate with upright posture    Status On-going   PT LONG TERM GOAL #5   Title improve FOTO =/< 21%    Status On-going   PT LONG TERM GOAL #6   Title perform lifting up to 15# waist to head level without difficulty   Status On-going               Plan - 12/22/14 1241    Clinical Impression Statement Pt is beginning to see functional changes with his ADLs.  Has poor habits in regards to use of Lt LE along with weakness, tightness and pain at times.    Pt will benefit from skilled therapeutic intervention in order to improve on the following deficits Abnormal gait;Difficulty  walking;Impaired flexibility;Improper body mechanics;Postural dysfunction;Pain;Decreased balance;Decreased strength   Rehab Potential Good   PT Frequency 2x / week   PT Duration 8 weeks   PT Treatment/Interventions Moist Heat;Therapeutic activities;Patient/family education;Passive range of motion;Therapeutic exercise;DME Instruction;Ultrasound;Gait training;Balance training;Manual techniques;Stair training;Cryotherapy;Electrical Stimulation;Functional mobility training   PT Next Visit Plan manual therapy Lt hip and knee, increase weight bearing confidence in Lt LE   Consulted and Agree with Plan of Care Patient        Problem List Patient Active Problem List   Diagnosis Date Noted  . Hammer toe of left foot 12/11/2014  . Osteoarthritis of left hip 12/11/2014  . Osteoarthritis of both knees 11/28/2014  . Rupture of left quadriceps muscle post repair 11/28/2014    Roderic Scarce, PT 12/22/2014, 12:45 PM  [facility] [address] [place], [place], [address] Phone: [phone]   Fax:  [phone]

## 2014-12-23 ENCOUNTER — Ambulatory Visit (INDEPENDENT_AMBULATORY_CARE_PROVIDER_SITE_OTHER): Payer: Medicare (Managed Care) | Admitting: Podiatry

## 2014-12-23 ENCOUNTER — Ambulatory Visit (INDEPENDENT_AMBULATORY_CARE_PROVIDER_SITE_OTHER): Payer: Medicare (Managed Care)

## 2014-12-23 ENCOUNTER — Encounter: Payer: Self-pay | Admitting: Podiatry

## 2014-12-23 VITALS — BP 118/68 | HR 59 | Resp 16

## 2014-12-23 DIAGNOSIS — M2042 Other hammer toe(s) (acquired), left foot: Secondary | ICD-10-CM | POA: Diagnosis not present

## 2014-12-23 DIAGNOSIS — M2010 Hallux valgus (acquired), unspecified foot: Secondary | ICD-10-CM

## 2014-12-23 DIAGNOSIS — Q6650 Congenital pes planus, unspecified foot: Secondary | ICD-10-CM

## 2014-12-23 NOTE — Progress Notes (Signed)
   Subjective:    Patient ID: Jordan PeacockRoger Donnelly, male    DOB: 03/23/1966, 49 y.o.   MRN: [id number]  HPI Comments: "I have a painful toe"  Patient c/o tender 2nd toe left for about 1 year.  The toe is hammered with a corn at the knuckle. Its rubs in his shoes. He has been using OTC meds to soften the area.   Toe Pain       Review of Systems  Constitutional: Positive for fatigue.  Musculoskeletal: Positive for myalgias, back pain, arthralgias and gait problem.  Neurological: Positive for weakness.  All other systems reviewed and are negative.      Objective:   Physical Exam: I have reviewed his past medical history medications allergies surgery social history and review of systems. Pulses are strongly palpable bilateral. Neurologic sensorium is intact per Semmes-Weinstein monofilament. Deep tendon reflexes are intact bilateral and muscle strength is 5 over 5 dorsiflexion plantar flexors and inverters and everters all intrinsic musculature is intact. Orthopedic evaluation demonstrates severe flatfoot deformity bilateral hallux abductovalgus deformity is noted bilateral left greater than right. A severe hammertoe deformity with dislocation at the second metatarsophalangeal joint left greater than right. Otherwise flexible hammertoe deformities lesser digits. Hallux valgus deformity of the left foot is so severe there is no place for the second toe to lay. Cutaneous evaluation demonstrates supple well-hydrated cutis reactive hyperkeratosis to the dorsal aspect of the PIPJ left without ulceration. I see no signs of skin breakdown. Radiographic evaluation does demonstrate cutaneous calcifications of the legs with moderate edema in the legs. Severe pes planus bilateral with an increase in the first intermetatarsal angle greater than 25 of the left foot indicative of severe hallux valgus deformity. Osteoarthritic changes are noted across the midfoot and forefoot laterally.        Assessment & Plan:    Assessment: Hallux abductovalgus deformity left foot greater than the right. Severe pes planus bilateral. Hammertoe deformity #2 left greater than the right.  Plan: Discussed etiology pathology conservative versus surgical therapies. We did discuss the possible amputation of the second digit of the left foot. We discussed possibly correcting the flatfoot deformity as well as the bunion deformity bilaterally. He will discuss this with his wife and follow up with us in the near future.

## 2014-12-25 ENCOUNTER — Encounter: Payer: Self-pay | Admitting: Physical Therapy

## 2014-12-25 ENCOUNTER — Ambulatory Visit (INDEPENDENT_AMBULATORY_CARE_PROVIDER_SITE_OTHER): Payer: Medicare (Managed Care) | Admitting: Sports Medicine

## 2014-12-25 ENCOUNTER — Ambulatory Visit (INDEPENDENT_AMBULATORY_CARE_PROVIDER_SITE_OTHER): Payer: Medicare (Managed Care) | Admitting: Physical Therapy

## 2014-12-25 ENCOUNTER — Encounter: Payer: Self-pay | Admitting: Sports Medicine

## 2014-12-25 VITALS — BP 112/68 | HR 50 | Ht 70.0 in | Wt 242.0 lb

## 2014-12-25 DIAGNOSIS — M170 Bilateral primary osteoarthritis of knee: Secondary | ICD-10-CM

## 2014-12-25 DIAGNOSIS — M549 Dorsalgia, unspecified: Secondary | ICD-10-CM

## 2014-12-25 DIAGNOSIS — R293 Abnormal posture: Secondary | ICD-10-CM

## 2014-12-25 DIAGNOSIS — M6281 Muscle weakness (generalized): Secondary | ICD-10-CM

## 2014-12-25 DIAGNOSIS — M2560 Stiffness of unspecified joint, not elsewhere classified: Secondary | ICD-10-CM

## 2014-12-25 DIAGNOSIS — M2042 Other hammer toe(s) (acquired), left foot: Secondary | ICD-10-CM | POA: Diagnosis not present

## 2014-12-25 NOTE — Progress Notes (Signed)
  Subjective:    CC: Follow-up  HPI: Knee osteoarthritis: End-stage, he has lost a significant amount of weight with gastric bypass, unfortunately has not responded to formal physical therapy or injections and he does have a history of a left quadriceps rupture post repair. At this point I do think he has become a candidate for total knee arthroplasty.  Hammertoe of left foot: Recently saw podiatry, there is planned amputation.  Past medical history, Surgical history, Family history not pertinant except as noted below, Social history, Allergies, and medications have been entered into the medical record, reviewed, and no changes needed.   Review of Systems: No fevers, chills, night sweats, weight loss, chest pain, or shortness of breath.   Objective:    General: Well Developed, well nourished, and in no acute distress.  Neuro: Alert and oriented x3, extra-ocular muscles intact, sensation grossly intact.  HEENT: Normocephalic, atraumatic, pupils equal round reactive to light, neck supple, no masses, no lymphadenopathy, thyroid nonpalpable.  Skin: Warm and dry, no rashes. Cardiac: Regular rate and rhythm, no murmurs rubs or gallops, no lower extremity edema.  Respiratory: Clear to auscultation bilaterally. Not using accessory muscles, speaking in full sentences.  Impression and Recommendations:

## 2014-12-25 NOTE — Assessment & Plan Note (Signed)
Has seen podiatry, seems like and rotation of the left second toe is going to be the best option. He will also probably end up with bilateral hallux valgus surgery. This will probably occur after total knee arthroplasty.

## 2014-12-25 NOTE — Assessment & Plan Note (Signed)
At this point I think he has become a candidate for total knee arthroplasty, bilateral. I would like to refer him to Dr. Luiz BlareGraves, he has failed injections and formal physical therapy.

## 2014-12-25 NOTE — Therapy (Signed)
[facility] [address] [place], [place], [address] Phone: [phone]   Fax:  [phone]  Physical Therapy Treatment  Patient Details  Name: Jordan PeacockRoger Monnin MRN: [id number] Date of Birth: 01/25/1966 Referring Provider:  Laren BoomHommel, Sean, DO  Encounter Date: 12/25/2014      PT End of Session - 12/25/14 0932    Visit Number 6   Number of Visits 16   Date for PT Re-Evaluation 02/03/15   PT Start Time 0932   PT Stop Time 1018   PT Time Calculation (min) 46 min      History reviewed. No pertinent past medical history.  Past Surgical History  Procedure Laterality Date  . 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 Reattached tendon Left 05/2011    LEFT LEG    There were no vitals filed for this visit.  Visit Diagnosis:  Muscle weakness-general  Abnormal posture  Stiffness of multiple joints  Back pain, unspecified location      Subjective Assessment - 12/25/14 0934    Symptoms Just saw MD and he is being referred to an orthopedic MD to discuss having knee replacements.    Currently in Pain? No/denies                       [facility]/Exercise - 12/25/14 0001    Lumbar Exercises: Aerobic   Stationary Bike NuStep level 6 x 6 min   Lumbar Exercises: Prone   Straight Leg Raise 20 reps  each side, then bent knee leg ext.   Other Prone Lumbar Exercises 10 reps each upper body lifts, arms in small of back, T, W, Y   Other Prone Lumbar Exercises TKE's with toes curled under 3x10 very hard on Lt   Knee/Hip Exercises: Standing   Heel Raises 10 reps  vc's for form   Knee/Hip Exercises: Supine   Bridges 3 sets;10 reps  legs straight on bolster   Other Supine Knee Exercises leg presses x10 each   Manual Therapy   Manual Therapy Joint mobilization;Myofascial release   Joint Mobilization Lt hip, PA mobs, IR, extension and knee ext   Myofascial Release STW to Lt hamstring, AA prone  hip flex stretching                  PT Short Term Goals - 12/22/14 1243    PT SHORT TERM GOAL #1   Title I with initial HEP   Status Achieved   PT SHORT TERM GOAL #2   Title increase Lt hip ROM to WNL    Status On-going   PT SHORT TERM GOAL #3   Title increase Rt hip extension to WNL   Status On-going   PT SHORT TERM GOAL #4   Title ambulate keeping the Lt LE straight and not need the cane   Status On-going   PT SHORT TERM GOAL #5   Title improve FOTO =/< 30%    Status On-going           PT Long Term Goals - 12/22/14 1244    PT LONG TERM GOAL #1   Title I with advanced HEP to include core work   Status On-going   PT LONG TERM GOAL #2   Title demonstrate bilat hip strength =/> 4+/5   Status On-going   PT LONG TERM GOAL #3   Title report back pain decrease =/> 50% with daily activity   Status On-going   PT  LONG TERM GOAL #4   Title ambulate with upright posture    Status On-going   PT LONG TERM GOAL #5   Title improve FOTO =/< 21%    Status On-going   PT LONG TERM GOAL #6   Title perform lifting up to 15# waist to head level without difficulty   Status On-going               Plan - 12/25/14 0952    Clinical Impression Statement Pt is going to look into having TKR's, Is trying to focus more on heel toe gait pattern and keeping his legs pointing forward.    Pt will benefit from skilled therapeutic intervention in order to improve on the following deficits Abnormal gait;Difficulty walking;Impaired flexibility;Improper body mechanics;Postural dysfunction;Pain;Decreased balance;Decreased strength   Rehab Potential Good   PT Treatment/Interventions Moist Heat;Therapeutic activities;Patient/family education;Passive range of motion;Therapeutic exercise;DME Instruction;Ultrasound;Gait training;Balance training;Manual techniques;Stair training;Cryotherapy;Electrical Stimulation;Functional mobility training   PT Next Visit Plan continue to work on stretching  out Lt hip/knee, increase strength back of body.    Consulted and Agree with Plan of Care Patient        Problem List Patient Active Problem List   Diagnosis Date Noted  . Hammer toe of left foot 12/11/2014  . Osteoarthritis of left hip 12/11/2014  . Osteoarthritis of both knees 11/28/2014  . Rupture of left quadriceps muscle post repair 11/28/2014    Roderic Scarce, PT 12/25/2014, 10:45 AM  [facility] [address] [place], [place], [address] Phone: [phone]   Fax:  [phone]

## 2014-12-30 ENCOUNTER — Encounter: Payer: Self-pay | Admitting: Physical Therapy

## 2014-12-30 ENCOUNTER — Ambulatory Visit (INDEPENDENT_AMBULATORY_CARE_PROVIDER_SITE_OTHER): Payer: Medicare (Managed Care) | Admitting: Physical Therapy

## 2014-12-30 DIAGNOSIS — M6281 Muscle weakness (generalized): Secondary | ICD-10-CM

## 2014-12-30 DIAGNOSIS — R293 Abnormal posture: Secondary | ICD-10-CM

## 2014-12-30 DIAGNOSIS — M2560 Stiffness of unspecified joint, not elsewhere classified: Secondary | ICD-10-CM

## 2014-12-30 NOTE — Therapy (Signed)
[place], [place], [address] Phone: [phone]   Fax:  [phone]  Physical Therapy Treatment  Patient Details  Name: Jordan Bailey MRN: [id number] Date of Birth: 11-Jul-1966 Referring Provider:  Silverio Decamp,*  Encounter Date: 12/30/2014      PT End of Session - 12/30/14 0811    Visit Number 7   Number of Visits 16   Date for PT Re-Evaluation 02/03/15   PT Start Time 0806   PT Stop Time 0849   PT Time Calculation (min) 43 min   Activity Tolerance Patient tolerated treatment well      History reviewed. No pertinent past medical history.  Past Surgical History  Procedure Laterality Date  . 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 Reattached tendon Left 05/2011    LEFT LEG    There were no vitals filed for this visit.  Visit Diagnosis:  Muscle weakness-general  Abnormal posture  Stiffness of multiple joints      Subjective Assessment - 12/30/14 0810    Symptoms I have to continually stretch or my legs get tight again.  I also have to concentrate on my walking and being upright.    Currently in Pain? No/denies            [facility] PT Assessment - 12/30/14 0001    Assessment   Medical Diagnosis [name]   Onset Date 09/22/15   Next MD Visit not set up    AROM   Right/Left Knee Left   Left Knee Extension -9   Left Knee Flexion 112   PROM   Right/Left Hip Right;Left   Left Hip Extension -8    Left Knee Extension -5   Strength   Overall Strength Comments Lt knee WNL   Left Hip Flexion 4+/5   Left Hip Extension 4-/5   Left Hip ABduction 4+/5                   [facility] Adult PT Treatment/Exercise - 12/30/14 0001    Lumbar Exercises: Aerobic   Stationary Bike NuStep level 6 x 6 min   Lumbar Exercises: Prone   Other Prone Lumbar Exercises 10 reps each upper body lifts, arms in small of back, T, W, Y   Knee/Hip Exercises: Sidelying    Other Sidelying Knee Exercises Rt & Lt hip extension 3x10   Knee/Hip Exercises: Prone   Hip Extension Both;3 sets;15 reps   Other Prone Exercises TKE with 5 sec holds x 20 reps    Manual Therapy   Manual Therapy Joint mobilization;Myofascial release   Joint Mobilization Lt hip, PA mobs, IR, extension and knee ext                  PT Short Term Goals - 12/30/14 3419    PT SHORT TERM GOAL #1   Title I with initial HEP   Status Achieved   PT SHORT TERM GOAL #2   Title increase Lt hip ROM to WNL    Status On-going   PT SHORT TERM GOAL #3   Title increase Rt hip extension to WNL   Status On-going   PT SHORT TERM GOAL #4   Title ambulate keeping the Lt LE straight and not need the cane   Status On-going   PT SHORT TERM GOAL #5   Title improve FOTO =/< 30%    Status On-going           PT  Long Term Goals - 12/30/14 3817    PT LONG TERM GOAL #1   Title I with advanced HEP to include core work   Status On-going   PT LONG TERM GOAL #2   Title demonstrate bilat hip strength =/> 4+/5   Status On-going   PT LONG TERM GOAL #3   Title report back pain decrease =/> 50% with daily activity   Status Achieved   PT LONG TERM GOAL #4   Title ambulate with upright posture    Status On-going   PT LONG TERM GOAL #5   Title improve FOTO =/< 21%    Status On-going   PT LONG TERM GOAL #6   Title perform lifting up to 15# waist to head level without difficulty   Status On-going               Plan - 12/30/14 0846    Clinical Impression Statement Pt continues with slow improvement in hip ROM and strength, has met some more goals.  Tightness in the hips continue to impede his progress.    Pt will benefit from skilled therapeutic intervention in order to improve on the following deficits Abnormal gait;Difficulty walking;Impaired flexibility;Improper body mechanics;Postural dysfunction;Pain;Decreased balance;Decreased strength   Rehab Potential Good   PT Frequency 2x / week    PT Duration 8 weeks   PT Treatment/Interventions Moist Heat;Therapeutic activities;Patient/family education;Passive range of motion;Therapeutic exercise;DME Instruction;Ultrasound;Gait training;Balance training;Manual techniques;Stair training;Cryotherapy;Electrical Stimulation;Functional mobility training   PT Next Visit Plan continue to work on stretching out Lt hip/knee, increase strength back of body.    Consulted and Agree with Plan of Care Patient        Problem List Patient Active Problem List   Diagnosis Date Noted  . Hammer toe of left foot 12/11/2014  . Osteoarthritis of left hip 12/11/2014  . Osteoarthritis of both knees 11/28/2014  . Rupture of left quadriceps muscle post repair 11/28/2014    Jeral Pinch, PT 12/30/2014, 8:53 AM  [facility] [place], [place], [address] Phone: [phone]   Fax:  [phone]

## 2014-12-31 ENCOUNTER — Telehealth: Payer: Self-pay | Admitting: *Deleted

## 2014-12-31 NOTE — Telephone Encounter (Signed)
"  I want to discuss removing toe on the left foot, surgery."  I called and informed him we will get him scheduled for surgery when he comes in for his consultation on 01/08/2015.  "Okay, thank you."

## 2015-01-02 ENCOUNTER — Ambulatory Visit (INDEPENDENT_AMBULATORY_CARE_PROVIDER_SITE_OTHER): Payer: Medicare (Managed Care) | Admitting: Physical Therapy

## 2015-01-02 ENCOUNTER — Encounter: Payer: Self-pay | Admitting: Physical Therapy

## 2015-01-02 DIAGNOSIS — R293 Abnormal posture: Secondary | ICD-10-CM

## 2015-01-02 DIAGNOSIS — M2560 Stiffness of unspecified joint, not elsewhere classified: Secondary | ICD-10-CM

## 2015-01-02 DIAGNOSIS — M6281 Muscle weakness (generalized): Secondary | ICD-10-CM | POA: Diagnosis not present

## 2015-01-02 NOTE — Therapy (Signed)
[facility] [place] [address] [place], [place], [address] Phone: [phone]   Fax:  [phone]  Physical Therapy Treatment  Patient Details  Name: Jordan Bailey MRN: [id number] Date of Birth: [birth year] Referring Provider:  Monica Becton,*  Encounter Date: 01/02/2015      PT End of Session - 01/02/15 0856    Visit Number 8   Number of Visits 16   Date for PT Re-Evaluation 02/03/15   PT Start Time 0856   PT Stop Time 0935   PT Time Calculation (min) 39 min      History reviewed. No pertinent past medical history.  Past Surgical History  Procedure Laterality Date  . 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 Reattached tendon Left 05/2011    LEFT LEG    There were no vitals filed for this visit.  Visit Diagnosis:  Muscle weakness-general  Abnormal posture  Stiffness of multiple joints      Subjective Assessment - 01/02/15 0858    Symptoms Pt reports he has noticed that his legs are "bowed out" now that he is working on keeping his legs straight.    Currently in Pain? No/denies                       [facility] Adult PT Treatment/Exercise - 01/02/15 0001    Lumbar Exercises: Aerobic   Stationary Bike NuStep level 6 x 6 min   Lumbar Exercises: Standing   Heel Raises 20 reps  off edge of step   Other Standing Lumbar Exercises heel taps off 4" step bilat. difficult Rt d/t knee pain.    Lumbar Exercises: Supine   Bridge --  3x10 with feet on green ball, then with marching   Lumbar Exercises: Prone   Other Prone Lumbar Exercises 3x10 bent knee hip extensions   Other Prone Lumbar Exercises 3x10 upper body lifts with arms T   Knee/Hip Exercises: Standing   Other Standing Knee Exercises walking BWDS around gym with VC for upright posture, standing trunk ext with tapping pelvis ion to counter.    Manual Therapy   Manual Therapy Passive ROM   Myofascial Release SKTC, Lt LE traction, IR                   PT Short Term Goals - 12/30/14 6962    PT SHORT TERM GOAL #1   Title I with initial HEP   Status Achieved   PT SHORT TERM GOAL #2   Title increase Lt hip ROM to WNL    Status On-going   PT SHORT TERM GOAL #3   Title increase Rt hip extension to WNL   Status On-going   PT SHORT TERM GOAL #4   Title ambulate keeping the Lt LE straight and not need the cane   Status On-going   PT SHORT TERM GOAL #5   Title improve FOTO =/< 30%    Status On-going           PT Long Term Goals - 12/30/14 9528    PT LONG TERM GOAL #1   Title I with advanced HEP to include core work   Status On-going   PT LONG TERM GOAL #2   Title demonstrate bilat hip strength =/> 4+/5   Status On-going   PT LONG TERM GOAL #3   Title report back pain decrease =/> 50% with daily activity   Status Achieved   PT LONG TERM GOAL #4  Title ambulate with upright posture    Status On-going   PT LONG TERM GOAL #5   Title improve FOTO =/< 21%    Status On-going   PT LONG TERM GOAL #6   Title perform lifting up to 15# waist to head level without difficulty   Status On-going               Plan - 01/02/15 0937    Clinical Impression Statement Pt is more aware of his walking pattern and is keeping his Lt LE straighter.  Continues with ROM limitations in the hip and knee.    Pt will benefit from skilled therapeutic intervention in order to improve on the following deficits Abnormal gait;Difficulty walking;Impaired flexibility;Improper body mechanics;Postural dysfunction;Pain;Decreased balance;Decreased strength   Rehab Potential Good   PT Frequency 2x / week   PT Duration 8 weeks   PT Treatment/Interventions Moist Heat;Therapeutic activities;Patient/family education;Passive range of motion;Therapeutic exercise;DME Instruction;Ultrasound;Gait training;Balance training;Manual techniques;Stair training;Cryotherapy;Electrical Stimulation;Functional mobility training   Consulted and  Agree with Plan of Care Patient        Problem List Patient Active Problem List   Diagnosis Date Noted  . Hammer toe of left foot 12/11/2014  . Osteoarthritis of left hip 12/11/2014  . Osteoarthritis of both knees 11/28/2014  . Rupture of left quadriceps muscle post repair 11/28/2014    Roderic ScarceSusan Nakkia Mackiewicz, PT 01/02/2015, 11:02 AM  [facility] [address] [place], [place], [address] Phone: [phone]   Fax:  [phone]

## 2015-01-05 ENCOUNTER — Ambulatory Visit (INDEPENDENT_AMBULATORY_CARE_PROVIDER_SITE_OTHER): Payer: Medicare (Managed Care) | Admitting: Physical Therapy

## 2015-01-05 ENCOUNTER — Encounter: Payer: Self-pay | Admitting: Physical Therapy

## 2015-01-05 DIAGNOSIS — M6281 Muscle weakness (generalized): Secondary | ICD-10-CM | POA: Diagnosis not present

## 2015-01-05 DIAGNOSIS — R293 Abnormal posture: Secondary | ICD-10-CM

## 2015-01-05 DIAGNOSIS — M2560 Stiffness of unspecified joint, not elsewhere classified: Secondary | ICD-10-CM

## 2015-01-05 NOTE — Therapy (Signed)
[place], [place], [address] Phone: [phone]   Fax:  [phone]  Physical Therapy Treatment  Patient Details  Name: Jordan Bailey MRN: [id number] Date of Birth: 1966/09/18 Referring Provider:  Silverio Decamp,*  Encounter Date: 01/05/2015      PT End of Session - 01/05/15 0815    Visit Number 9   Number of Visits 16   Date for PT Re-Evaluation 02/03/15   PT Start Time 0811   PT Stop Time 0853   PT Time Calculation (min) 42 min   Activity Tolerance Patient tolerated treatment well      History reviewed. No pertinent past medical history.  Past Surgical History  Procedure Laterality Date  . 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 Reattached tendon Left 05/2011    LEFT LEG    There were no vitals filed for this visit.  Visit Diagnosis:  Muscle weakness-general  Abnormal posture  Stiffness of multiple joints      Subjective Assessment - 01/05/15 0816    Subjective Pt reports no real change from last visit.     Currently in Pain? No/denies            [facility] PT Assessment - 01/05/15 0001    Assessment   Medical Diagnosis [name]   Onset Date 09/22/15   Next MD Visit not set up    AROM   Right/Left Knee Left   Left Knee Extension -10   Left Knee Flexion 108   PROM   Right/Left Hip Right;Left   Left Hip Extension 2  Rt 8   Left Hip Flexion 112  Rt 125   Left Hip External Rotation  45   Left Hip Internal Rotation  20   Strength   Right Hip Flexion --  5-/5   Right Hip Extension 4-/5   Right Hip ABduction 5/5   Left Hip Flexion --  5-/5   Left Hip Extension 4/5   Left Hip ABduction 5/5                   [facility] Adult PT Treatment/Exercise - 01/05/15 0001    Lumbar Exercises: Aerobic   Stationary Bike NuStep level 6 x 6 min   Lumbar Exercises: Supine   Bridge --  2x10 with marching   Straight Leg Raise 20 reps  holding  yellow ball/reaching to LE    Lumbar Exercises: Prone   Other Prone Lumbar Exercises 3x10 bent knee hip extensions   Other Prone Lumbar Exercises 3x10 upper body lifts with arms T   Knee/Hip Exercises: Prone   Other Prone Exercises TKE with 5 sec holds x 20 reps    Manual Therapy   Manual Therapy Passive ROM;Joint mobilization   Joint Mobilization Lt hip, PA mobs, IR, extension and knee ext   Passive ROM IR Lt hip, extension and knee extension                  PT Short Term Goals - 01/05/15 4967    PT SHORT TERM GOAL #1   Title I with initial HEP   Status Achieved   PT SHORT TERM GOAL #2   Title increase Lt hip ROM to WNL    Status On-going   PT SHORT TERM GOAL #3   Title increase Rt hip extension to WNL   Status Achieved   PT SHORT TERM GOAL #4   Title ambulate keeping the Lt LE straight  and not need the cane   Status On-going   PT SHORT TERM GOAL #5   Title improve FOTO =/< 30%    Status On-going           PT Long Term Goals - 01/05/15 0817    PT LONG TERM GOAL #1   Title I with advanced HEP to include core work   Status On-going   PT LONG TERM GOAL #2   Title demonstrate bilat hip strength =/> 4+/5   Status On-going   PT LONG TERM GOAL #3   Title report back pain decrease =/> 50% with daily activity   Status Achieved   PT LONG TERM GOAL #4   Title ambulate with upright posture    Status On-going   PT LONG TERM GOAL #5   Title improve FOTO =/< 21%    Status On-going   PT LONG TERM GOAL #6   Title perform lifting up to 15# waist to head level without difficulty   Status On-going               Plan - 01/05/15 0854    Clinical Impression Statement Pt has met some goals this week, Lt hip ROM is improving as well as hip strength.    Pt will benefit from skilled therapeutic intervention in order to improve on the following deficits Abnormal gait;Difficulty walking;Impaired flexibility;Improper body mechanics;Postural dysfunction;Pain;Decreased  balance;Decreased strength   Rehab Potential Good   PT Frequency 2x / week   PT Duration 8 weeks   PT Treatment/Interventions Moist Heat;Therapeutic activities;Patient/family education;Passive range of motion;Therapeutic exercise;DME Instruction;Ultrasound;Gait training;Balance training;Manual techniques;Stair training;Cryotherapy;Electrical Stimulation;Functional mobility training   Consulted and Agree with Plan of Care Patient        Problem List Patient Active Problem List   Diagnosis Date Noted  . Hammer toe of left foot 12/11/2014  . Osteoarthritis of left hip 12/11/2014  . Osteoarthritis of both knees 11/28/2014  . Rupture of left quadriceps muscle post repair 11/28/2014    Jeral Pinch, PT 01/05/2015, 8:57 AM  [facility] [address] [place], [place], [address] Phone: [phone]   Fax:  [phone]

## 2015-01-08 ENCOUNTER — Ambulatory Visit (INDEPENDENT_AMBULATORY_CARE_PROVIDER_SITE_OTHER): Payer: Medicare (Managed Care) | Admitting: Podiatry

## 2015-01-08 ENCOUNTER — Encounter: Payer: Self-pay | Admitting: Podiatry

## 2015-01-08 VITALS — BP 164/84 | HR 61 | Resp 15

## 2015-01-08 DIAGNOSIS — M2042 Other hammer toe(s) (acquired), left foot: Secondary | ICD-10-CM

## 2015-01-08 NOTE — Patient Instructions (Signed)
Pre-Operative Instructions  Congratulations, you have decided to take an important step to improving your quality of life.  You can be assured that the doctors of [facility] will be with you every step of the way.  1. Plan to be at the surgery center/hospital at least 1 (one) hour prior to your scheduled time unless otherwise directed by the surgical center/hospital staff.  You must have a responsible adult accompany you, remain during the surgery and drive you home.  Make sure you have directions to the surgical center/hospital and know how to get there on time. 2. For hospital based surgery you will need to obtain a history and physical form from your family physician within 1 month prior to the date of surgery- we will give you a form for you primary physician.  3. We make every effort to accommodate the date you request for surgery.  There are however, times where surgery dates or times have to be moved.  We will contact you as soon as possible if a change in schedule is required.   4. No Aspirin/Ibuprofen for one week before surgery.  If you are on aspirin, any non-steroidal anti-inflammatory medications (Mobic, Aleve, Ibuprofen) you should stop taking it 7 days prior to your surgery.  You make take Tylenol  For pain prior to surgery.  5. Medications- If you are taking daily heart and blood pressure medications, seizure, reflux, allergy, asthma, anxiety, pain or diabetes medications, make sure the surgery center/hospital is aware before the day of surgery so they may notify you which medications to take or avoid the day of surgery. 6. No food or drink after midnight the night before surgery unless directed otherwise by surgical center/hospital staff. 7. No alcoholic beverages 24 hours prior to surgery.  No smoking 24 hours prior to or 24 hours after surgery. 8. Wear loose pants or shorts- loose enough to fit over bandages, boots, and casts. 9. No slip on shoes, sneakers are best. 10. Bring  your boot with you to the surgery center/hospital.  Also bring crutches or a walker if your physician has prescribed it for you.  If you do not have this equipment, it will be provided for you after surgery. 11. If you have not been contracted by the surgery center/hospital by the day before your surgery, call to confirm the date and time of your surgery. 12. Leave-time from work may vary depending on the type of surgery you have.  Appropriate arrangements should be made prior to surgery with your employer. 13. Prescriptions will be provided immediately following surgery by your doctor.  Have these filled as soon as possible after surgery and take the medication as directed. 14. Remove nail polish on the operative foot. 15. Wash the night before surgery.  The night before surgery wash the foot and leg well with the antibacterial soap provided and water paying special attention to beneath the toenails and in between the toes.  Rinse thoroughly with water and dry well with a towel.  Perform this wash unless told not to do so by your physician.  Enclosed: 1 Ice pack (please put in freezer the night before surgery)   1 Hibiclens skin cleaner   Pre-op Instructions  If you have any questions regarding the instructions, do not hesitate to call our office.  [place]: [address] [place], [place] [address] [phone]  [place]: [address] [place], [place] [address] [phone]  [place]: [address]  [place], [place] [address] [phone]  Dr. Richard   Tuchman DPM, Dr. Norman Regal DPM Dr. Richard Sikora DPM, Dr. M. Todd Hyatt DPM, Dr. Kathryn Egerton DPM 

## 2015-01-08 NOTE — Progress Notes (Signed)
He and his wife present today for surgical consult regarding his bilateral hammertoe deformities. He is considering removal of his second toes in lieu of a quick recovery rather than correcting either his flatfoot deformity for the bunion deformity. He states that he and his wife talked about discussed at like to have this done in the near future.  Objective: Vital signs are stable to alert and oriented 3 moderate to severe pes planus bilaterally with hallux abductovalgus deformity bilateral. Hammertoe deformities second bilateral hammertoes are rigid in their deformity and do not purchase the ground. 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  Assessment: Hallux valgus deformity with pes planus bilateral painful hammertoe deformity second digit bilateral with osteoarthritis.  Plan: Discussed etiology pathology conservative versus surgical therapies at this point we went over a consent form line by line number by number giving him ample time to ask questions he saw the regarding disarticulation of the second digit bilaterally. I answered all of these questions to best of viability in layman's terms. We discussed all of the possible postop complications which may include but are not limited to postop pain bleeding swelling infection recurrence of the discomfort and necessity for further surgery and worsening of his bunion condition. They discussed it amongst themselves and signed all 3 cages of the consent form. I discussed with him in great detail again today and previously, regarding amputation versus correction of the other deformities. He states that he would rather have the toes removed which bother him the most and get back to his family and work quickly rather than being out of work for 12 weeks for recovery from an osteotomy. I explained to him that the bunion correction would be 8-12 weeks in duration and that amputation of the second toe would be much  quicker and would heal faster for shorter timeline. I will follow-up with him in the near future for surgical correction.

## 2015-01-09 ENCOUNTER — Ambulatory Visit (INDEPENDENT_AMBULATORY_CARE_PROVIDER_SITE_OTHER): Payer: Medicare (Managed Care) | Admitting: Physical Therapy

## 2015-01-09 ENCOUNTER — Encounter (INDEPENDENT_AMBULATORY_CARE_PROVIDER_SITE_OTHER): Payer: Self-pay

## 2015-01-09 DIAGNOSIS — M6281 Muscle weakness (generalized): Secondary | ICD-10-CM

## 2015-01-09 DIAGNOSIS — M549 Dorsalgia, unspecified: Secondary | ICD-10-CM

## 2015-01-09 DIAGNOSIS — R293 Abnormal posture: Secondary | ICD-10-CM

## 2015-01-09 DIAGNOSIS — M2560 Stiffness of unspecified joint, not elsewhere classified: Secondary | ICD-10-CM | POA: Diagnosis not present

## 2015-01-09 NOTE — Therapy (Addendum)
[place] [place], [place], [address] Phone: [phone]   Fax:  [phone]  Physical Therapy Treatment  Patient Details  Name: Jordan Bailey MRN: [id number] Date of Birth: 1966/01/20 Referring Provider:  Silverio Decamp,*  Encounter Date: 01/09/2015      PT End of Session - 01/09/15 0851    Visit Number 10   Number of Visits 16   Date for PT Re-Evaluation 02/03/15   PT Start Time 0850   PT Stop Time 0933   PT Time Calculation (min) 43 min   Activity Tolerance Patient tolerated treatment well      No past medical history on file.  Past Surgical History  Procedure Laterality Date  . 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 Reattached tendon Left 05/2011    LEFT LEG    There were no vitals filed for this visit.  Visit Diagnosis:  Muscle weakness-general  Abnormal posture  Stiffness of multiple joints  Back pain, unspecified location      Subjective Assessment - 01/09/15 0851    Subjective Pt reports noticing a difference (improvement) getting socks and shoes on.  Pt reports he is having foot surgery next Friday; therapy will have to be held 2 wks following this to allow incisions to heal. "I only get to be on my feet 5 min of every hour after surgery".    Currently in Pain? No/denies            [facility] PT Assessment - 01/09/15 0001    Assessment   Medical Diagnosis [name]   Onset Date 09/22/15                   [facility] Adult PT Treatment/Exercise - 01/09/15 0001    Exercises   Exercises Knee/Hip   Lumbar Exercises: Supine   Bridge 20 reps  with slow marching. 2 sets. VC for form   Straight Leg Raise 20 reps  holding yellow ball/reaching to LE    Other Supine Lumbar Exercises Bridges with BLE on ball x 10; reverse HS curls with BLE on ball x 5 reps x 2 set   Lumbar Exercises: Prone   Other Prone Lumbar Exercises 10 reps of cervical/thoracic ext  with arms at side x 10, arms in T x 10.    Knee/Hip Exercises: Aerobic   Stationary Bike NuStep L5- 7 min    Knee/Hip Exercises: Prone   Hip Extension Strengthening;Right;Left;1 set;15 reps  with bent knee   Other Prone Exercises TKE with 5 sec holds x 10 reps each leg.    LLE poor quad contraction-substitutes glutes   Manual Therapy   Manual Therapy Passive ROM;Joint mobilization   Joint Mobilization Lt hip, PA mobs, IR, extension, Rt hip ER with oscillation , LLE manual traction    Passive ROM IR/ER Lt hip, extension, Rt hip IR/ER.                   PT Short Term Goals - 01/05/15 0817    PT SHORT TERM GOAL #1   Title I with initial HEP   Status Achieved   PT SHORT TERM GOAL #2   Title increase Lt hip ROM to WNL    Status On-going   PT SHORT TERM GOAL #3   Title increase Rt hip extension to WNL   Status Achieved   PT SHORT TERM GOAL #4   Title ambulate keeping the Lt LE straight and not need  the cane   Status On-going   PT SHORT TERM GOAL #5   Title improve FOTO =/< 30%    Status On-going           PT Long Term Goals - 01/05/15 0817    PT LONG TERM GOAL #1   Title I with advanced HEP to include core work   Status On-going   PT LONG TERM GOAL #2   Title demonstrate bilat hip strength =/> 4+/5   Status On-going   PT LONG TERM GOAL #3   Title report back pain decrease =/> 50% with daily activity   Status Achieved   PT LONG TERM GOAL #4   Title ambulate with upright posture    Status On-going   PT LONG TERM GOAL #5   Title improve FOTO =/< 21%    Status On-going   PT LONG TERM GOAL #6   Title perform lifting up to 15# waist to head level without difficulty   Status On-going               Plan - 01/09/15 1020    Clinical Impression Statement Pt tolerated new bridging exercises with LE on swiss ball well.  Pt reported ease in hip stiffness with manual therapy.    Pt will benefit from skilled therapeutic intervention in order to improve on the  following deficits Abnormal gait;Difficulty walking;Impaired flexibility;Improper body mechanics;Postural dysfunction;Pain;Decreased balance;Decreased strength   Rehab Potential Good   PT Frequency 2x / week   PT Duration 8 weeks   PT Treatment/Interventions Moist Heat;Therapeutic activities;Patient/family education;Passive range of motion;Therapeutic exercise;DME Instruction;Ultrasound;Gait training;Balance training;Manual techniques;Stair training;Cryotherapy;Electrical Stimulation;Functional mobility training   PT Next Visit Plan continue to work on stretching out Lt hip/knee, increase strength back of body.    Consulted and Agree with Plan of Care Patient        Problem List Patient Active Problem List   Diagnosis Date Noted  . Hammer toe of left foot 12/11/2014  . Osteoarthritis of left hip 12/11/2014  . Osteoarthritis of both knees 11/28/2014  . Rupture of left quadriceps muscle post repair 11/28/2014    Kerin Perna, PTA 01/09/2015 10:29 AM  [place] [address] [place], [place], [address] Phone: [phone]   Fax:  [phone]     PHYSICAL THERAPY DISCHARGE SUMMARY  Visits from Start of Care: 10  Current functional level related to goals / functional outcomes: unknown   Remaining deficits: unknown   Education / Equipment: HEP Plan:                                                    Patient goals were not met. Patient is being discharged due to not returning since the last visit.  ?????    Jeral Pinch, PT 02/11/2015 2:56 PM

## 2015-01-12 ENCOUNTER — Ambulatory Visit (INDEPENDENT_AMBULATORY_CARE_PROVIDER_SITE_OTHER): Payer: Medicare (Managed Care) | Admitting: Family Medicine

## 2015-01-12 ENCOUNTER — Encounter: Payer: Self-pay | Admitting: Family Medicine

## 2015-01-12 VITALS — BP 120/70 | HR 61 | Ht 71.0 in | Wt 239.0 lb

## 2015-01-12 DIAGNOSIS — D649 Anemia, unspecified: Secondary | ICD-10-CM | POA: Insufficient documentation

## 2015-01-12 DIAGNOSIS — D508 Other iron deficiency anemias: Secondary | ICD-10-CM

## 2015-01-12 DIAGNOSIS — Z9884 Bariatric surgery status: Secondary | ICD-10-CM | POA: Insufficient documentation

## 2015-01-12 MED ORDER — FERROUS SULFATE 325 (65 FE) MG PO TBEC: 325.0000 mg | DELAYED_RELEASE_TABLET | Freq: Three times a day (TID) | ORAL | Status: DC

## 2015-01-12 NOTE — Progress Notes (Signed)
CC: Jordan Bailey is a 49 y.o. male is here for Establish Care   Subjective: HPI:  Pleasant 49 year old here to establish care   patient reports a history of anemia with his hemoglobin last checked 6 months ago. He believes that he was told that he only has iron deficiency and this was the only contributed factor to his anemia. It is not clear if he is ever had a colonoscopy but he denies any rectal bleeding. He is uncertain how long the anemia has been present. He had a gastric sleeve performed a few years ago and was able to lose around 200 pounds over the last few years but has difficulty consuming high protein meals due to the size of his stomach. He believes that the iron supplement that he is taking right now which is over-the-counter is helping his anemia with respect to not having any fatigue or shortness of breath. He works out on a daily basis at [employer]  Review of Systems - General ROS: negative for - chills, fever, night sweats, weight gain or weight loss Ophthalmic ROS: negative for - decreased vision Psychological ROS: negative for - anxiety or depression ENT ROS: negative for - hearing change, nasal congestion, tinnitus or allergies Hematological and Lymphatic ROS: negative for - bleeding problems, bruising or swollen lymph nodes Breast ROS: negative Respiratory ROS: no cough, shortness of breath, or wheezing Cardiovascular ROS: no chest pain or dyspnea on exertion Gastrointestinal ROS: no abdominal pain, change in bowel habits, or black or bloody stools Genito-Urinary ROS: negative for - genital discharge, genital ulcers, incontinence or abnormal bleeding from genitals Musculoskeletal ROS: negative for - joint pain or muscle pain Neurological ROS: negative for - headaches or memory loss Dermatological ROS: negative for lumps, mole changes, rash and skin lesion changes  No past medical history on file.  Past Surgical History  Procedure Laterality Date  . Laparoscopic gastric  sleeve resection  05/21/2014  . Reattached tendon Left 05/2011    LEFT LEG   No family history on file.  History   Social History  . Marital Status: Married    Spouse Name: N/A  . Number of Children: N/A  . Years of Education: N/A   Occupational History  . Not on file.   Social History Main Topics  . Smoking status: Never Smoker   . Smokeless tobacco: Not on file  . Alcohol Use: Not on file  . Drug Use: Not on file  . Sexual Activity: Not on file   Other Topics Concern  . Not on file   Social History Narrative     Objective: BP 120/70 mmHg  Pulse 61  Ht  (1.803 m)  Wt 239 lb (108.41 kg)  BMI 33.35 kg/m2  General: Alert and Oriented, No Acute Distress HEENT: Pupils equal, round, reactive to light. Conjunctivae clear.Moist mucous membranesgs: Clear to auscultation bilaterally, no wheezing/ronchi/rales.  Comfortable work of breathing. Good air movement. Cardiac: Regular rate and rhythm. Normal S1/S2.  No murmurs, rubs, nor gallops.   Extremities: No peripheral edema.  Strong peripheral pulses.  Mental Status: No depression, anxiety, nor agitation. Skin: Warm and dry.  Assessment & Plan: [name] was seen today for establish care.  Diagnoses and all orders for this visit:  Other iron deficiency anemias Orders: -     ferrous sulfate 325 (65 FE) MG EC tablet; Take 1 tablet (325 mg total) by mouth 3 (three) times daily with meals. -     CBC  Bariatric surgery status  Iron deficiency anemia: Clinically stable but due for hemoglobin and MCV check. Requesting outside records for clarification on any colonoscopy or stool cards.   Return in about 3 months (around 04/13/2015) for Complete Physical Exam with Labs.

## 2015-01-13 ENCOUNTER — Telehealth: Payer: Self-pay | Admitting: *Deleted

## 2015-01-13 ENCOUNTER — Encounter: Payer: Self-pay | Admitting: *Deleted

## 2015-01-13 LAB — CBC
HCT: 36.4 % — ABNORMAL LOW (ref 39.0–52.0)
HEMOGLOBIN: 12.1 g/dL — AB (ref 13.0–17.0)
MCH: 24.9 pg — ABNORMAL LOW (ref 26.0–34.0)
MCHC: 33.2 g/dL (ref 30.0–36.0)
MCV: 75.1 fL — ABNORMAL LOW (ref 78.0–100.0)
MPV: 9 fL (ref 8.6–12.4)
Platelets: 247 10*3/uL (ref 150–400)
RBC: 4.85 MIL/uL (ref 4.22–5.81)
RDW: 15.9 % — ABNORMAL HIGH (ref 11.5–15.5)
WBC: 7.2 10*3/uL (ref 4.0–10.5)

## 2015-01-13 NOTE — Telephone Encounter (Signed)
"  Jordan PeacockRoger Bailey canceled his surgery."    I called patient to see if he wanted to reschedule surgery.  "I do want to reschedule but I'll call back to reschedule."

## 2015-01-15 ENCOUNTER — Encounter: Payer: Medicare (Managed Care) | Admitting: Physical Therapy

## 2015-01-15 ENCOUNTER — Encounter: Payer: Medicare Other | Admitting: Physical Therapy

## 2015-01-19 ENCOUNTER — Encounter: Payer: Medicare Other | Admitting: Physical Therapy

## 2015-01-21 ENCOUNTER — Encounter (HOSPITAL_COMMUNITY): Payer: Self-pay

## 2015-01-21 ENCOUNTER — Other Ambulatory Visit: Payer: Self-pay | Admitting: Orthopedic Surgery

## 2015-01-21 ENCOUNTER — Other Ambulatory Visit (HOSPITAL_COMMUNITY): Payer: Self-pay | Admitting: *Deleted

## 2015-01-21 ENCOUNTER — Encounter: Payer: Medicare Other | Admitting: Physical Therapy

## 2015-01-21 ENCOUNTER — Encounter (HOSPITAL_COMMUNITY)
Admission: RE | Admit: 2015-01-21 | Discharge: 2015-01-21 | Disposition: A | Discharge status: Home / Self Care | Payer: Medicare (Managed Care) | Source: Ambulatory Visit | Attending: Orthopedic Surgery | Admitting: Orthopedic Surgery

## 2015-01-21 DIAGNOSIS — Z01812 Encounter for preprocedural laboratory examination: Secondary | ICD-10-CM | POA: Diagnosis not present

## 2015-01-21 HISTORY — DX: Unspecified osteoarthritis, unspecified site: M19.90

## 2015-01-21 HISTORY — DX: Iron deficiency anemia, unspecified: D50.9

## 2015-01-21 LAB — CBC
HEMATOCRIT: 37.2 % — AB (ref 39.0–52.0)
HEMOGLOBIN: 12.4 g/dL — AB (ref 13.0–17.0)
MCH: 25.3 pg — AB (ref 26.0–34.0)
MCHC: 33.3 g/dL (ref 30.0–36.0)
MCV: 75.8 fL — AB (ref 78.0–100.0)
Platelets: 210 10*3/uL (ref 150–400)
RBC: 4.91 MIL/uL (ref 4.22–5.81)
RDW: 15.1 % (ref 11.5–15.5)
WBC: 5.3 10*3/uL (ref 4.0–10.5)

## 2015-01-21 LAB — SURGICAL PCR SCREEN
MRSA, PCR: NEGATIVE
STAPHYLOCOCCUS AUREUS: NEGATIVE

## 2015-01-21 LAB — TYPE AND SCREEN
ABO/RH(D): O POS
Antibody Screen: NEGATIVE

## 2015-01-21 LAB — PROTIME-INR
INR: 1.07 (ref 0.00–1.49)
PROTHROMBIN TIME: 14.1 s (ref 11.6–15.2)

## 2015-01-21 LAB — APTT: aPTT: 33 seconds (ref 24–37)

## 2015-01-21 LAB — ABO/RH: ABO/RH(D): O POS

## 2015-01-21 NOTE — Pre-Procedure Instructions (Signed)
Jordan PeacockRoger Bailey  01/21/2015   Your procedure is scheduled on:  Monday, Feb 02, 2015 at 12:35 PM.   [facility] Admitting Office at 10:30 AM.   Call this number if you have problems the morning of surgery: [phone]               Any questions prior to day of surgery, please call [phone] between 8 & 4 PM.   Remember:   Do not eat food or drink liquids after midnight Sunday, 02/01/15   Take these medicines the morning of surgery with A SIP OF WATER: Tramadol - if needed              Stop Multivitamins 7 days prior to surgery. Do not use any Aspirin products or NSAIDS (Ibuprofen, Aleve, etc) 7 days prior to surgery.   Do not wear jewelry.  Do not wear lotions, powders, or cologne. You may wear deodorant.  Men may shave face and neck.  Do not bring valuables to the hospital.  [facility] is not responsible                  for any belongings or valuables.               Contacts, dentures or bridgework may not be worn into surgery.  Leave suitcase in the car. After surgery it may be brought to your room.  For patients admitted to the hospital, discharge time is determined by your                treatment team.               Special Instructions: [place] [place] - Preparing for Surgery  Before surgery, you can play an important role.  Because skin is not sterile, your skin needs to be as free of germs as possible.  You can reduce the number of germs on you skin by washing with CHG (chlorahexidine gluconate) soap before surgery.  CHG is an antiseptic cleaner which kills germs and bonds with the skin to continue killing germs even after washing.  Please DO NOT use if you have an allergy to CHG or antibacterial soaps.  If your skin becomes reddened/irritated stop using the CHG and inform your nurse when you arrive at Short Stay.  Do not shave (including legs and underarms) for at least 48 hours prior to the first CHG shower.  You may shave your face.  Please  follow these instructions carefully:   1.  Shower with CHG Soap the night before surgery and the                                morning of Surgery.  2.  If you choose to wash your hair, wash your hair first as usual with your       normal shampoo.  3.  After you shampoo, rinse your hair and body thoroughly to remove the                      Shampoo.  4.  Use CHG as you would any other liquid soap.  You can apply chg directly       to the skin and wash gently with scrungie or a clean washcloth.  5.  Apply the CHG Soap to your body ONLY FROM THE NECK DOWN.  Do not use on open wounds or open sores.  Avoid contact with your eyes, ears, mouth and genitals (private parts).  Wash genitals (private parts) with your normal soap.  6.  Wash thoroughly, paying special attention to the area where your surgery        will be performed.  7.  Thoroughly rinse your body with warm water from the neck down.  8.  DO NOT shower/wash with your normal soap after using and rinsing off       the CHG Soap.  9.  Pat yourself dry with a clean towel.            10.  Wear clean pajamas.            11.  Place clean sheets on your bed the night of your first shower and do not        sleep with pets.  Day of Surgery  Do not apply any lotions the morning of surgery.  Please wear clean clothes to the hospital.     Please read over the following fact sheets that you were given: Pain Booklet, Coughing and Deep Breathing, Blood Transfusion Information, MRSA Information and Surgical Site Infection Prevention

## 2015-01-21 NOTE — Progress Notes (Signed)
Pt denies any cardiac history. Denies chest pain or sob. Pt has lost over 200 lbs in past 1.5 years after gastric sleeve surgery. Pt states he works out every day. Has never been treated for HTN or Diabetes even when he was at his heaviest weight of 510 lbs.

## 2015-01-21 NOTE — Progress Notes (Signed)
No pre-op orders in EPIC. Called Dr. Stacy GardnerGraves's office and spoke with Florentina AddisonKatie. She sent message to Dr. Luiz BlareGraves.

## 2015-01-22 ENCOUNTER — Encounter: Payer: Medicare (Managed Care) | Admitting: Podiatry

## 2015-01-23 ENCOUNTER — Ambulatory Visit: Payer: Medicare Other | Admitting: Sports Medicine

## 2015-01-26 ENCOUNTER — Encounter: Payer: Medicare Other | Admitting: Physical Therapy

## 2015-01-29 ENCOUNTER — Encounter: Payer: Medicare Other | Admitting: Physical Therapy

## 2015-01-30 ENCOUNTER — Encounter (HOSPITAL_COMMUNITY): Admission: RE | Payer: Self-pay | Source: Ambulatory Visit

## 2015-01-30 ENCOUNTER — Inpatient Hospital Stay (HOSPITAL_COMMUNITY)
Admission: RE | Admit: 2015-01-30 | Payer: Medicare (Managed Care) | Source: Ambulatory Visit | Admitting: Orthopedic Surgery

## 2015-01-30 SURGERY — ARTHROPLASTY, KNEE, TOTAL
Anesthesia: General | Laterality: Left

## 2015-02-23 ENCOUNTER — Encounter (HOSPITAL_COMMUNITY)
Admission: RE | Admit: 2015-02-23 | Discharge: 2015-02-23 | Disposition: A | Discharge status: Home / Self Care | Payer: Medicare Other | Source: Ambulatory Visit | Attending: Orthopedic Surgery | Admitting: Orthopedic Surgery

## 2015-02-23 ENCOUNTER — Ambulatory Visit (HOSPITAL_COMMUNITY)
Admission: RE | Admit: 2015-02-23 | Discharge: 2015-02-23 | Disposition: A | Discharge status: Home / Self Care | Payer: Medicare Other | Source: Ambulatory Visit | Attending: Orthopedic Surgery | Admitting: Orthopedic Surgery

## 2015-02-23 DIAGNOSIS — M179 Osteoarthritis of knee, unspecified: Secondary | ICD-10-CM | POA: Insufficient documentation

## 2015-02-23 DIAGNOSIS — R001 Bradycardia, unspecified: Secondary | ICD-10-CM | POA: Diagnosis not present

## 2015-02-23 DIAGNOSIS — Z01818 Encounter for other preprocedural examination: Secondary | ICD-10-CM | POA: Diagnosis not present

## 2015-02-23 DIAGNOSIS — Z0183 Encounter for blood typing: Secondary | ICD-10-CM | POA: Diagnosis not present

## 2015-02-23 DIAGNOSIS — Z01812 Encounter for preprocedural laboratory examination: Secondary | ICD-10-CM | POA: Diagnosis not present

## 2015-02-23 LAB — BASIC METABOLIC PANEL
ANION GAP: 7 (ref 5–15)
BUN: 8 mg/dL (ref 6–20)
CO2: 27 mmol/L (ref 22–32)
Calcium: 9.2 mg/dL (ref 8.9–10.3)
Chloride: 104 mmol/L (ref 101–111)
Creatinine, Ser: 0.8 mg/dL (ref 0.61–1.24)
Glucose, Bld: 96 mg/dL (ref 65–99)
Potassium: 4.1 mmol/L (ref 3.5–5.1)
SODIUM: 138 mmol/L (ref 135–145)

## 2015-02-23 LAB — TYPE AND SCREEN
ABO/RH(D): O POS
ANTIBODY SCREEN: NEGATIVE

## 2015-02-23 LAB — SURGICAL PCR SCREEN
MRSA, PCR: NEGATIVE
Staphylococcus aureus: NEGATIVE

## 2015-02-23 LAB — PROTIME-INR
INR: 1.12 (ref 0.00–1.49)
Prothrombin Time: 14.6 seconds (ref 11.6–15.2)

## 2015-02-23 LAB — CBC WITH DIFFERENTIAL/PLATELET
BASOS PCT: 0 % (ref 0–1)
Basophils Absolute: 0 10*3/uL (ref 0.0–0.1)
Eosinophils Absolute: 0 10*3/uL (ref 0.0–0.7)
Eosinophils Relative: 0 % (ref 0–5)
HCT: 39.4 % (ref 39.0–52.0)
Hemoglobin: 13.3 g/dL (ref 13.0–17.0)
LYMPHS ABS: 2.9 10*3/uL (ref 0.7–4.0)
Lymphocytes Relative: 66 % — ABNORMAL HIGH (ref 12–46)
MCH: 25.2 pg — ABNORMAL LOW (ref 26.0–34.0)
MCHC: 33.8 g/dL (ref 30.0–36.0)
MCV: 74.8 fL — ABNORMAL LOW (ref 78.0–100.0)
MONOS PCT: 8 % (ref 3–12)
Monocytes Absolute: 0.4 10*3/uL (ref 0.1–1.0)
Neutro Abs: 1.2 10*3/uL — ABNORMAL LOW (ref 1.7–7.7)
Neutrophils Relative %: 26 % — ABNORMAL LOW (ref 43–77)
Platelets: 188 10*3/uL (ref 150–400)
RBC: 5.27 MIL/uL (ref 4.22–5.81)
RDW: 15.1 % (ref 11.5–15.5)
WBC: 4.5 10*3/uL (ref 4.0–10.5)

## 2015-02-23 LAB — URINALYSIS, ROUTINE W REFLEX MICROSCOPIC
BILIRUBIN URINE: NEGATIVE
Glucose, UA: NEGATIVE mg/dL
HGB URINE DIPSTICK: NEGATIVE
Ketones, ur: NEGATIVE mg/dL
Leukocytes, UA: NEGATIVE
Nitrite: NEGATIVE
PROTEIN: NEGATIVE mg/dL
Specific Gravity, Urine: 1.014 (ref 1.005–1.030)
UROBILINOGEN UA: 1 mg/dL (ref 0.0–1.0)
pH: 6 (ref 5.0–8.0)

## 2015-02-23 LAB — APTT: APTT: 32 s (ref 24–37)

## 2015-02-23 MED ORDER — CHLORHEXIDINE GLUCONATE 4 % EX LIQD: 60.0000 mL | Freq: Once | CUTANEOUS | Status: DC

## 2015-02-23 NOTE — Pre-Procedure Instructions (Addendum)
    Jordan PeacockRoger Bailey  02/23/2015        Your procedure is scheduled on March 06, 2015.  [facility] Ad[facility] at 7:45 A.M.  Call this number if you have problems the morning of surgery:  [phone]   Remember:  Do not eat food or drink liquids after midnight.  Take these medicines the morning of surgery with A SIP OF WATER :pain pill if needed   STOP ASPIRIN, HERBAL SUPPLEMENTS, NSAIDS (ADVIL, IBUPROFEN,ALEVE)   Do not wear jewelry.  Do not wear lotions, powders, or colognes.  You may wear deodorant.  Men may shave face and neck.  Do not bring valuables to the hospital.  [facility] is not responsible for any belongings or valuables.  Contacts, dentures or bridgework may not be worn into surgery.  Leave your suitcase in the car.  After surgery it may be brought to your room.  For patients admitted to the hospital, discharge time will be determined by your treatment team.   Please read over the following fact sheets that you were given. Pain Booklet, Coughing and Deep Breathing, Blood Transfusion Information, Total Joint Packet and Surgical Site Infection Prevention

## 2015-02-24 DIAGNOSIS — M170 Bilateral primary osteoarthritis of knee: Secondary | ICD-10-CM | POA: Diagnosis not present

## 2015-03-05 ENCOUNTER — Encounter (HOSPITAL_COMMUNITY): Payer: Self-pay | Admitting: Anesthesiology

## 2015-03-05 MED ORDER — LACTATED RINGERS IV SOLN
INTRAVENOUS | Status: DC
  Administered 2015-03-06: 08:00:00 via INTRAVENOUS

## 2015-03-05 MED ORDER — CEFAZOLIN SODIUM-DEXTROSE 2-3 GM-% IV SOLR
2.0000 g | INTRAVENOUS | Status: AC
  Administered 2015-03-06: 2 g via INTRAVENOUS
  Filled 2015-03-05: qty 50

## 2015-03-05 NOTE — Progress Notes (Signed)
Pt notified of time change;to arrive at 0700.Verbalized understanding.

## 2015-03-06 ENCOUNTER — Encounter (HOSPITAL_COMMUNITY)
Admission: RE | Disposition: A | Discharge status: Home Health | Payer: Self-pay | Source: Ambulatory Visit | Attending: Orthopedic Surgery

## 2015-03-06 ENCOUNTER — Inpatient Hospital Stay (HOSPITAL_COMMUNITY): Payer: Medicare Other | Admitting: Anesthesiology

## 2015-03-06 ENCOUNTER — Inpatient Hospital Stay (HOSPITAL_COMMUNITY)
Admission: RE | Admit: 2015-03-06 | Discharge: 2015-03-08 | DRG: 470 | Disposition: A | Discharge status: Home Health | Payer: Medicare Other | Source: Ambulatory Visit | Attending: Orthopedic Surgery | Admitting: Orthopedic Surgery

## 2015-03-06 ENCOUNTER — Encounter (HOSPITAL_COMMUNITY): Payer: Self-pay | Admitting: *Deleted

## 2015-03-06 DIAGNOSIS — M1712 Unilateral primary osteoarthritis, left knee: Principal | ICD-10-CM | POA: Diagnosis present

## 2015-03-06 DIAGNOSIS — M179 Osteoarthritis of knee, unspecified: Secondary | ICD-10-CM | POA: Diagnosis not present

## 2015-03-06 DIAGNOSIS — G8918 Other acute postprocedural pain: Secondary | ICD-10-CM | POA: Diagnosis not present

## 2015-03-06 DIAGNOSIS — E669 Obesity, unspecified: Secondary | ICD-10-CM | POA: Diagnosis present

## 2015-03-06 DIAGNOSIS — Z9884 Bariatric surgery status: Secondary | ICD-10-CM

## 2015-03-06 DIAGNOSIS — M1612 Unilateral primary osteoarthritis, left hip: Secondary | ICD-10-CM | POA: Diagnosis present

## 2015-03-06 DIAGNOSIS — M25562 Pain in left knee: Secondary | ICD-10-CM | POA: Diagnosis not present

## 2015-03-06 DIAGNOSIS — F17210 Nicotine dependence, cigarettes, uncomplicated: Secondary | ICD-10-CM | POA: Diagnosis present

## 2015-03-06 HISTORY — PX: TOTAL KNEE ARTHROPLASTY: SHX125

## 2015-03-06 SURGERY — ARTHROPLASTY, KNEE, TOTAL
Anesthesia: General | Laterality: Left

## 2015-03-06 MED ORDER — CEFAZOLIN SODIUM-DEXTROSE 2-3 GM-% IV SOLR
2.0000 g | Freq: Four times a day (QID) | INTRAVENOUS | Status: AC
  Administered 2015-03-06: 2 g via INTRAVENOUS
  Filled 2015-03-06 (×2): qty 50

## 2015-03-06 MED ORDER — HYDROMORPHONE HCL 1 MG/ML IJ SOLN: 1.0000 mg | INTRAMUSCULAR | Status: DC | PRN

## 2015-03-06 MED ORDER — MIDAZOLAM HCL 2 MG/2ML IJ SOLN
INTRAMUSCULAR | Status: AC
  Filled 2015-03-06: qty 2

## 2015-03-06 MED ORDER — SUCCINYLCHOLINE CHLORIDE 20 MG/ML IJ SOLN
INTRAMUSCULAR | Status: AC
  Filled 2015-03-06: qty 1

## 2015-03-06 MED ORDER — SODIUM CHLORIDE 0.9 % IV SOLN
INTRAVENOUS | Status: DC
  Administered 2015-03-07: 05:00:00 via INTRAVENOUS

## 2015-03-06 MED ORDER — FENTANYL CITRATE (PF) 250 MCG/5ML IJ SOLN
INTRAMUSCULAR | Status: AC
  Filled 2015-03-06: qty 5

## 2015-03-06 MED ORDER — ALUM & MAG HYDROXIDE-SIMETH 200-200-20 MG/5ML PO SUSP: 30.0000 mL | ORAL | Status: DC | PRN

## 2015-03-06 MED ORDER — BUPIVACAINE HCL (PF) 0.5 % IJ SOLN
INTRAMUSCULAR | Status: DC | PRN
  Administered 2015-03-06: 20 mL

## 2015-03-06 MED ORDER — 0.9 % SODIUM CHLORIDE (POUR BTL) OPTIME
TOPICAL | Status: DC | PRN
  Administered 2015-03-06: 1000 mL

## 2015-03-06 MED ORDER — HYDROMORPHONE HCL 1 MG/ML IJ SOLN
INTRAMUSCULAR | Status: AC
  Filled 2015-03-06: qty 1

## 2015-03-06 MED ORDER — ZOLPIDEM TARTRATE 5 MG PO TABS: 5.0000 mg | ORAL_TABLET | Freq: Every evening | ORAL | Status: DC | PRN

## 2015-03-06 MED ORDER — PROPOFOL 10 MG/ML IV BOLUS
INTRAVENOUS | Status: DC | PRN
  Administered 2015-03-06: 200 mg via INTRAVENOUS

## 2015-03-06 MED ORDER — FLEET ENEMA 7-19 GM/118ML RE ENEM: 1.0000 | ENEMA | Freq: Once | RECTAL | Status: AC | PRN

## 2015-03-06 MED ORDER — RIVAROXABAN 10 MG PO TABS
10.0000 mg | ORAL_TABLET | Freq: Every day | ORAL | Status: DC
  Administered 2015-03-07 – 2015-03-08 (×2): 10 mg via ORAL
  Filled 2015-03-06 (×2): qty 1

## 2015-03-06 MED ORDER — PROPOFOL 10 MG/ML IV BOLUS
INTRAVENOUS | Status: AC
  Filled 2015-03-06: qty 20

## 2015-03-06 MED ORDER — OXYCODONE-ACETAMINOPHEN 5-325 MG PO TABS
1.0000 | ORAL_TABLET | ORAL | Status: DC | PRN
  Administered 2015-03-06 – 2015-03-08 (×5): 2 via ORAL
  Filled 2015-03-06 (×6): qty 2

## 2015-03-06 MED ORDER — METHOCARBAMOL 1000 MG/10ML IJ SOLN
500.0000 mg | Freq: Four times a day (QID) | INTRAVENOUS | Status: DC | PRN
  Filled 2015-03-06: qty 5

## 2015-03-06 MED ORDER — ACETAMINOPHEN 325 MG PO TABS: 650.0000 mg | ORAL_TABLET | Freq: Four times a day (QID) | ORAL | Status: DC | PRN

## 2015-03-06 MED ORDER — FENTANYL CITRATE (PF) 100 MCG/2ML IJ SOLN
100.0000 ug | Freq: Once | INTRAMUSCULAR | Status: AC
  Administered 2015-03-06: 100 ug via INTRAVENOUS

## 2015-03-06 MED ORDER — DEXAMETHASONE SODIUM PHOSPHATE 10 MG/ML IJ SOLN
10.0000 mg | Freq: Two times a day (BID) | INTRAMUSCULAR | Status: AC
  Administered 2015-03-06 – 2015-03-07 (×2): 10 mg via INTRAVENOUS
  Filled 2015-03-06 (×2): qty 1

## 2015-03-06 MED ORDER — SUCCINYLCHOLINE CHLORIDE 20 MG/ML IJ SOLN
INTRAMUSCULAR | Status: DC | PRN
  Administered 2015-03-06: 100 mg via INTRAVENOUS

## 2015-03-06 MED ORDER — GLYCOPYRROLATE 0.2 MG/ML IJ SOLN
INTRAMUSCULAR | Status: AC
  Filled 2015-03-06: qty 1

## 2015-03-06 MED ORDER — LACTATED RINGERS IV SOLN
INTRAVENOUS | Status: DC | PRN
  Administered 2015-03-06 (×2): via INTRAVENOUS

## 2015-03-06 MED ORDER — BISACODYL 10 MG RE SUPP
10.0000 mg | Freq: Every day | RECTAL | Status: DC | PRN
  Administered 2015-03-08: 10 mg via RECTAL
  Filled 2015-03-06: qty 1

## 2015-03-06 MED ORDER — MIDAZOLAM HCL 2 MG/2ML IJ SOLN
2.0000 mg | Freq: Once | INTRAMUSCULAR | Status: AC
  Administered 2015-03-06: 2 mg via INTRAVENOUS

## 2015-03-06 MED ORDER — PHENYLEPHRINE 40 MCG/ML (10ML) SYRINGE FOR IV PUSH (FOR BLOOD PRESSURE SUPPORT)
PREFILLED_SYRINGE | INTRAVENOUS | Status: AC
  Filled 2015-03-06: qty 10

## 2015-03-06 MED ORDER — OXYCODONE-ACETAMINOPHEN 5-325 MG PO TABS: 1.0000 | ORAL_TABLET | ORAL | Status: DC | PRN

## 2015-03-06 MED ORDER — TRANEXAMIC ACID 1000 MG/10ML IV SOLN
2000.0000 mg | INTRAVENOUS | Status: AC
  Administered 2015-03-06: 2000 mg via TOPICAL
  Filled 2015-03-06: qty 20

## 2015-03-06 MED ORDER — METHOCARBAMOL 500 MG PO TABS
500.0000 mg | ORAL_TABLET | Freq: Four times a day (QID) | ORAL | Status: DC | PRN
  Administered 2015-03-06 – 2015-03-07 (×2): 500 mg via ORAL
  Filled 2015-03-06 (×3): qty 1

## 2015-03-06 MED ORDER — ONDANSETRON HCL 4 MG PO TABS: 4.0000 mg | ORAL_TABLET | Freq: Four times a day (QID) | ORAL | Status: DC | PRN

## 2015-03-06 MED ORDER — ONDANSETRON HCL 4 MG/2ML IJ SOLN: 4.0000 mg | Freq: Four times a day (QID) | INTRAMUSCULAR | Status: DC | PRN

## 2015-03-06 MED ORDER — EPHEDRINE SULFATE 50 MG/ML IJ SOLN
INTRAMUSCULAR | Status: AC
  Filled 2015-03-06: qty 1

## 2015-03-06 MED ORDER — MIDAZOLAM HCL 2 MG/2ML IJ SOLN
INTRAMUSCULAR | Status: AC
  Administered 2015-03-06: 2 mg via INTRAVENOUS
  Filled 2015-03-06: qty 2

## 2015-03-06 MED ORDER — POLYETHYLENE GLYCOL 3350 17 G PO PACK
17.0000 g | PACK | Freq: Every day | ORAL | Status: DC | PRN
  Administered 2015-03-08: 17 g via ORAL

## 2015-03-06 MED ORDER — DOCUSATE SODIUM 100 MG PO CAPS
100.0000 mg | ORAL_CAPSULE | Freq: Two times a day (BID) | ORAL | Status: DC
Start: 2015-03-06 — End: 2015-03-08
  Administered 2015-03-06 – 2015-03-08 (×4): 100 mg via ORAL
  Filled 2015-03-06 (×5): qty 1

## 2015-03-06 MED ORDER — HYDROMORPHONE HCL 1 MG/ML IJ SOLN
0.2500 mg | INTRAMUSCULAR | Status: DC | PRN
  Administered 2015-03-06 (×2): 0.5 mg via INTRAVENOUS

## 2015-03-06 MED ORDER — METHOCARBAMOL 750 MG PO TABS: 750.0000 mg | ORAL_TABLET | Freq: Three times a day (TID) | ORAL | Status: DC | PRN

## 2015-03-06 MED ORDER — DIPHENHYDRAMINE HCL 12.5 MG/5ML PO ELIX: 12.5000 mg | ORAL_SOLUTION | ORAL | Status: DC | PRN

## 2015-03-06 MED ORDER — PROMETHAZINE HCL 25 MG/ML IJ SOLN: 6.2500 mg | INTRAMUSCULAR | Status: DC | PRN

## 2015-03-06 MED ORDER — ACETAMINOPHEN 650 MG RE SUPP: 650.0000 mg | Freq: Four times a day (QID) | RECTAL | Status: DC | PRN

## 2015-03-06 MED ORDER — LIDOCAINE HCL (CARDIAC) 20 MG/ML IV SOLN
INTRAVENOUS | Status: AC
  Filled 2015-03-06: qty 5

## 2015-03-06 MED ORDER — BUPIVACAINE LIPOSOME 1.3 % IJ SUSP
20.0000 mL | INTRAMUSCULAR | Status: AC
  Administered 2015-03-06: 20 mL
  Filled 2015-03-06: qty 20

## 2015-03-06 MED ORDER — FERROUS SULFATE 325 (65 FE) MG PO TABS
325.0000 mg | ORAL_TABLET | Freq: Two times a day (BID) | ORAL | Status: DC
  Administered 2015-03-06 – 2015-03-07 (×3): 325 mg via ORAL
  Filled 2015-03-06 (×4): qty 1

## 2015-03-06 MED ORDER — FENTANYL CITRATE (PF) 100 MCG/2ML IJ SOLN
INTRAMUSCULAR | Status: DC | PRN
  Administered 2015-03-06: 100 ug via INTRAVENOUS

## 2015-03-06 MED ORDER — SODIUM CHLORIDE 0.9 % IR SOLN
Status: DC | PRN
  Administered 2015-03-06: 3000 mL

## 2015-03-06 MED ORDER — RIVAROXABAN 10 MG PO TABS: 10.0000 mg | ORAL_TABLET | Freq: Every day | ORAL | Status: DC

## 2015-03-06 MED ORDER — FENTANYL CITRATE (PF) 100 MCG/2ML IJ SOLN
INTRAMUSCULAR | Status: AC
  Administered 2015-03-06: 100 ug via INTRAVENOUS
  Filled 2015-03-06: qty 2

## 2015-03-06 SURGICAL SUPPLY — 61 items
BANDAGE ESMARK 6X9 LF (GAUZE/BANDAGES/DRESSINGS) ×1 IMPLANT
BENZOIN TINCTURE PRP APPL 2/3 (GAUZE/BANDAGES/DRESSINGS) ×2 IMPLANT
BLADE SAGITTAL 25.0X1.19X90 (BLADE) ×2 IMPLANT
BLADE SAW SAG 90X13X1.27 (BLADE) ×2 IMPLANT
BNDG ESMARK 6X9 LF (GAUZE/BANDAGES/DRESSINGS) ×2
BOWL SMART MIX CTS (DISPOSABLE) ×2 IMPLANT
CAP KNEE TOTAL 3 SIGMA ×2 IMPLANT
CEMENT HV SMART SET (Cement) ×4 IMPLANT
COVER SURGICAL LIGHT HANDLE (MISCELLANEOUS) ×2 IMPLANT
CUFF TOURNIQUET SINGLE 34IN LL (TOURNIQUET CUFF) ×2 IMPLANT
CUFF TOURNIQUET SINGLE 44IN (TOURNIQUET CUFF) IMPLANT
DRAPE EXTREMITY T 121X128X90 (DRAPE) ×2 IMPLANT
DRAPE IMP U-DRAPE 54X76 (DRAPES) ×2 IMPLANT
DRAPE U-SHAPE 47X51 STRL (DRAPES) ×2 IMPLANT
DRSG MEPILEX BORDER 4X12 (GAUZE/BANDAGES/DRESSINGS) ×2 IMPLANT
DRSG MEPILEX BORDER 4X8 (GAUZE/BANDAGES/DRESSINGS) ×2 IMPLANT
DRSG PAD ABDOMINAL 8X10 ST (GAUZE/BANDAGES/DRESSINGS) ×2 IMPLANT
DURAPREP 26ML APPLICATOR (WOUND CARE) ×2 IMPLANT
ELECT REM PT RETURN 9FT ADLT (ELECTROSURGICAL) ×2
ELECTRODE REM PT RTRN 9FT ADLT (ELECTROSURGICAL) ×1 IMPLANT
EVACUATOR 1/8 PVC DRAIN (DRAIN) ×2 IMPLANT
FACESHIELD WRAPAROUND (MASK) ×2 IMPLANT
GAUZE SPONGE 4X4 12PLY STRL (GAUZE/BANDAGES/DRESSINGS) ×2 IMPLANT
GLOVE BIOGEL PI IND STRL 6.5 (GLOVE) ×2 IMPLANT
GLOVE BIOGEL PI IND STRL 8 (GLOVE) ×2 IMPLANT
GLOVE BIOGEL PI INDICATOR 6.5 (GLOVE) ×2
GLOVE BIOGEL PI INDICATOR 8 (GLOVE) ×2
GLOVE ECLIPSE 7.5 STRL STRAW (GLOVE) ×4 IMPLANT
GLOVE SURG SS PI 6.0 STRL IVOR (GLOVE) ×4 IMPLANT
GOWN STRL REUS W/ TWL LRG LVL3 (GOWN DISPOSABLE) ×1 IMPLANT
GOWN STRL REUS W/ TWL XL LVL3 (GOWN DISPOSABLE) ×2 IMPLANT
GOWN STRL REUS W/TWL LRG LVL3 (GOWN DISPOSABLE) ×1
GOWN STRL REUS W/TWL XL LVL3 (GOWN DISPOSABLE) ×2
HANDPIECE INTERPULSE COAX TIP (DISPOSABLE) ×1
HOOD PEEL AWAY FACE SHEILD DIS (HOOD) ×6 IMPLANT
IMMOBILIZER KNEE 22 UNIV (SOFTGOODS) ×2 IMPLANT
KIT BASIN OR (CUSTOM PROCEDURE TRAY) ×2 IMPLANT
KIT ROOM TURNOVER OR (KITS) ×2 IMPLANT
MANIFOLD NEPTUNE II (INSTRUMENTS) ×2 IMPLANT
NEEDLE SPNL 22GX3.5 QUINCKE BK (NEEDLE) ×2 IMPLANT
NS IRRIG 1000ML POUR BTL (IV SOLUTION) ×2 IMPLANT
PACK TOTAL JOINT (CUSTOM PROCEDURE TRAY) ×2 IMPLANT
PACK UNIVERSAL I (CUSTOM PROCEDURE TRAY) ×2 IMPLANT
PAD ARMBOARD 7.5X6 YLW CONV (MISCELLANEOUS) ×4 IMPLANT
PAD CAST 4YDX4 CTTN HI CHSV (CAST SUPPLIES) ×1 IMPLANT
PADDING CAST COTTON 4X4 STRL (CAST SUPPLIES) ×1
SET HNDPC FAN SPRY TIP SCT (DISPOSABLE) ×1 IMPLANT
STAPLER VISISTAT 35W (STAPLE) IMPLANT
STRIP CLOSURE SKIN 1/2X4 (GAUZE/BANDAGES/DRESSINGS) ×4 IMPLANT
SUCTION FRAZIER TIP 10 FR DISP (SUCTIONS) ×2 IMPLANT
SUT MNCRL AB 3-0 PS2 18 (SUTURE) IMPLANT
SUT VIC AB 0 CTB1 27 (SUTURE) ×4 IMPLANT
SUT VIC AB 1 CT1 27 (SUTURE) ×2
SUT VIC AB 1 CT1 27XBRD ANBCTR (SUTURE) ×2 IMPLANT
SUT VIC AB 2-0 CTB1 (SUTURE) ×4 IMPLANT
SYR 50ML LL SCALE MARK (SYRINGE) ×2 IMPLANT
TOWEL OR 17X24 6PK STRL BLUE (TOWEL DISPOSABLE) ×2 IMPLANT
TOWEL OR 17X26 10 PK STRL BLUE (TOWEL DISPOSABLE) ×2 IMPLANT
TRAY FOLEY CATH 16FRSI W/METER (SET/KITS/TRAYS/PACK) IMPLANT
UPCHARGE REV TRAY MBT KNEE ×2 IMPLANT
WRAP KNEE MAXI GEL POST OP (GAUZE/BANDAGES/DRESSINGS) ×2 IMPLANT

## 2015-03-06 NOTE — Evaluation (Signed)
Physical Therapy Evaluation Patient Details Name: Jordan Bailey MRN: [id number] DOB: 04-Nov-1965 Today's Date: 03/06/2015   History of Present Illness  Pt is a 49 y/o M s/p L TKA.  Pt's PMH includes anemia, bariatric surgery, hammer toe L foot, OA L hip, OA B knees, rupture of L quad muscle.  Clinical Impression  Pt is s/p L TKA resulting in the deficits listed below (see PT Problem List). Pt demonstrated ability to ambulate 90 ft this session. Pt will benefit from skilled PT to increase their independence and safety with mobility to allow discharge to the venue listed below.     Follow Up Recommendations Home health PT;Supervision for mobility/OOB    Equipment Recommendations  None recommended by PT    Recommendations for Other Services OT consult     Precautions / Restrictions Precautions Precautions: Knee;Fall Precaution Booklet Issued: Yes (comment) Precaution Comments: Reviewed no pillow under knee Required Braces or Orthoses: Knee Immobilizer - Left Knee Immobilizer - Left: On when out of bed or walking Restrictions Weight Bearing Restrictions: Yes LLE Weight Bearing: Weight bearing as tolerated      Mobility  Bed Mobility Overal bed mobility: Needs Assistance Bed Mobility: Supine to Sit     Supine to sit: Min assist     General bed mobility comments: Min assist w/ managing LLE during supine>sit 2/2 pt's fear of L knee bending.  KI donned in supine position prior to bed mobility.  Transfers Overall transfer level: Needs assistance Equipment used: Rolling walker (2 wheeled) Transfers: Sit to/from Stand Sit to Stand: Min guard         General transfer comment: Cues for proper hand placement and RLE placement.  Increased time to push to standing.  Ambulation/Gait Ambulation/Gait assistance: Min guard Ambulation Distance (Feet): 90 Feet Assistive device: Rolling walker (2 wheeled) Gait Pattern/deviations: Step-to pattern;Decreased stride length;Decreased stance  time - left;Antalgic;Trunk flexed   Gait velocity interpretation: Below normal speed for age/gender General Gait Details: Step to gait pattern w/ dec L knee E.  Slight trunk flexion.  Cues to keep RW in front of pt some so it does not get in the way when ambulating.  Stairs            Wheelchair Mobility    Modified Rankin (Stroke Patients Only)       Balance Overall balance assessment: Needs assistance Sitting-balance support: Single extremity supported;Feet supported Sitting balance-Leahy Scale: Good     Standing balance support: Bilateral upper extremity supported;During functional activity Standing balance-Leahy Scale: Fair                               Pertinent Vitals/Pain Pain Assessment: No/denies pain    Home Living Family/patient expects to be discharged to:: Private residence Living Arrangements: Spouse/significant other;Children;Other relatives (wife, daughter and daughter's children) Available Help at Discharge: Family;Available 24 hours/day Type of Home: House Home Access: Level entry     Home Layout: Two level Home Equipment: Cane - single point;Walker - 2 wheels;Bedside commode      Prior Function Level of Independence: Independent with assistive device(s)         Comments: cane at all times     Hand Dominance        Extremity/Trunk Assessment   Upper Extremity Assessment: Defer to OT evaluation           Lower Extremity Assessment: LLE deficits/detail   LLE Deficits / Details: weakness and limited ROM  as expected s/p L TKA     Communication   Communication: No difficulties  Cognition Arousal/Alertness: Awake/alert Behavior During Therapy: WFL for tasks assessed/performed Overall Cognitive Status: Within Functional Limits for tasks assessed                      General Comments      Exercises Total Joint Exercises Ankle Circles/Pumps: AROM;Both;10 reps;Supine Quad Sets: AROM;Both;10  reps;Supine Heel Slides: AROM;Left;5 reps;Supine Goniometric ROM: 9-87      Assessment/Plan    PT Assessment Patient needs continued PT services  PT Diagnosis Difficulty walking;Abnormality of gait;Generalized weakness;Acute pain   PT Problem List Decreased strength;Decreased range of motion;Decreased activity tolerance;Decreased balance;Decreased mobility;Decreased coordination;Decreased knowledge of use of DME;Decreased safety awareness;Decreased knowledge of precautions;Pain;Decreased skin integrity  PT Treatment Interventions DME instruction;Gait training;Stair training;Functional mobility training;Therapeutic activities;Therapeutic exercise;Balance training;Neuromuscular re-education;Patient/family education;Modalities   PT Goals (Current goals can be found in the Care Plan section) Acute Rehab PT Goals Patient Stated Goal: to get stronger and go home PT Goal Formulation: With patient Time For Goal Achievement: 03/13/15 Potential to Achieve Goals: Good    Frequency 7X/week   Barriers to discharge Inaccessible home environment 14 steps upstairs to bedroom/bathroom    Co-evaluation               End of Session Equipment Utilized During Treatment: Gait belt;Left knee immobilizer Activity Tolerance: Patient tolerated treatment well Patient left: in chair;with call bell/phone within reach;with family/visitor present;with nursing/sitter in room (w/ nurse tech in room) Nurse Communication: Mobility status;Precautions;Weight bearing status         Time: [id number] PT Time Calculation (min) (ACUTE ONLY): 36 min   Charges:   PT Evaluation $Initial PT Evaluation Tier I: 1 Procedure PT Treatments $Gait Training: 8-22 mins   PT G CodesMichail Jewels:       Ashley Parr PT, DPT [phone] Pager: [phone] 03/06/2015, 4:24 PM

## 2015-03-06 NOTE — Brief Op Note (Signed)
03/06/2015  8:07 PM  PATIENT:  Jordan Bailey  49 y.o. male  PRE-OPERATIVE DIAGNOSIS:  OSTEOARTHRITIS RIGHT KNEE  POST-OPERATIVE DIAGNOSIS:  OSTEOARTHRITIS RIGHT KNEE  PROCEDURE:  Procedure(s): TOTAL KNEE ARTHROPLASTY (Left)  SURGEON:  Surgeon(s) and Role:    * Jodi GeraldsJohn Vandy Fong, MD - Primary  PHYSICIAN ASSISTANT:   ASSISTANTS: bethune   ANESTHESIA:   general  EBL:  Total I/O In: -  Out: 500 [Urine:100; Drains:400]  BLOOD ADMINISTERED:none  DRAINS: (1) Hemovact drain(s) in the l knee with  Suction Open   LOCAL MEDICATIONS USED:  OTHER experel  SPECIMEN:  No Specimen  DISPOSITION OF SPECIMEN:  N/A  COUNTS:  YES  TOURNIQUET:   Total Tourniquet Time Documented: Thigh (Left) - 76 minutes Total: Thigh (Left) - 76 minutes   DICTATION: .Other Dictation: Dictation Number [id number]  PLAN OF CARE: Admit to inpatient   PATIENT DISPOSITION:  PACU - hemodynamically stable.   Delay start of Pharmacological VTE agent (>24hrs) due to surgical blood loss or risk of bleeding: no

## 2015-03-06 NOTE — Progress Notes (Signed)
Transported by Jonna MunroLaura Yontz RN

## 2015-03-06 NOTE — Progress Notes (Signed)
Dr.Edwards at bedside and aware of HR. Pt asymptomatic and states HR is normally slow. No new orders. Will cont to monitor.

## 2015-03-06 NOTE — Progress Notes (Signed)
CRNA at bedside.

## 2015-03-06 NOTE — Anesthesia Procedure Notes (Addendum)
Procedure Name: Intubation Date/Time: 03/06/2015 9:35 AM Performed by: Eligha Bridegroom Pre-anesthesia Checklist: Patient identified, Timeout performed, Emergency Drugs available, Suction available and Patient being monitored Patient Re-evaluated:Patient Re-evaluated prior to inductionOxygen Delivery Method: Circle system utilized Preoxygenation: Pre-oxygenation with 100% oxygen Intubation Type: IV induction Ventilation: Mask ventilation without difficulty and Oral airway inserted - appropriate to patient size Laryngoscope Size: Mac Grade View: Grade I Tube type: Oral Tube size: 8.0 mm Airway Equipment and Method: Stylet and LTA kit utilized Placement Confirmation: ETT inserted through vocal cords under direct vision,  breath sounds checked- equal and bilateral and positive ETCO2 Secured at: 23 cm Tube secured with: Tape Dental Injury: Teeth and Oropharynx as per pre-operative assessment    Anesthesia Regional Block:  Adductor canal block  Pre-Anesthetic Checklist: ,, timeout performed, Correct Patient, Correct Site, Correct Laterality, Correct Procedure, Correct Position, site marked, Risks and benefits discussed,  Surgical consent,  Pre-op evaluation,  At surgeon's request and post-op pain management  Laterality: Left  Prep: chloraprep       Needles:   Needle Type: Stimulator Needle - 80          Additional Needles:  Procedures: Doppler guided, ultrasound guided (picture in chart) and nerve stimulator Adductor canal block Narrative:  Start time: 03/06/2015 8:50 AM End time: 03/06/2015 9:05 AM Injection made incrementally with aspirations every 5 mL.  Performed by: Personally

## 2015-03-06 NOTE — Anesthesia Preprocedure Evaluation (Addendum)
Anesthesia Evaluation  Patient identified by MRN, date of birth, ID band Patient awake    Reviewed: Allergy & Precautions, NPO status , Patient's Chart, lab work & pertinent test results  Airway Mallampati: II  TM Distance: >3 FB Neck ROM: Full    Dental   Pulmonary Current Smoker,  breath sounds clear to auscultation        Cardiovascular negative cardio ROS  Rhythm:Regular Rate:Normal     Neuro/Psych    GI/Hepatic negative GI ROS, Neg liver ROS,   Endo/Other  negative endocrine ROS  Renal/GU Renal disease     Musculoskeletal   Abdominal   Peds  Hematology   Anesthesia Other Findings   Reproductive/Obstetrics                           Anesthesia Physical Anesthesia Plan  ASA: III  Anesthesia Plan: General   Post-op Pain Management: MAC Combined w/ Regional for Post-op pain   Induction: Intravenous  Airway Management Planned:   Additional Equipment:   Intra-op Plan:   Post-operative Plan:   Informed Consent: I have reviewed the patients History and Physical, chart, labs and discussed the procedure including the risks, benefits and alternatives for the proposed anesthesia with the patient or authorized representative who has indicated his/her understanding and acceptance.   Dental advisory given  Plan Discussed with: Anesthesiologist and Surgeon  Anesthesia Plan Comments:        Anesthesia Quick Evaluation

## 2015-03-06 NOTE — Transfer of Care (Signed)
Immediate Anesthesia Transfer of Care Note  Patient: Jordan Bailey  Procedure(s) Performed: Procedure(s): TOTAL KNEE ARTHROPLASTY (Left)  Patient Location: PACU  Anesthesia Type:General and GA combined with regional for post-op pain  Level of Consciousness: awake and alert   Airway & Oxygen Therapy: Patient Spontanous Breathing and Patient connected to nasal cannula oxygen  Post-op Assessment: Report given to RN and Post -op Vital signs reviewed and stable  Post vital signs: Reviewed and stable  Last Vitals:  Filed Vitals:   03/06/15 0915  BP: 114/55  Pulse: 68  Temp:   Resp: 16    Complications: No apparent anesthesia complications

## 2015-03-06 NOTE — H&P (Signed)
TOTAL KNEE ADMISSION H&P  Patient is being admitted for left total knee arthroplasty.  Subjective:  Chief Complaint:left knee pain.  HPI: Jordan Bailey, 49 y.o. male, has a history of pain and functional disability in the left knee due to arthritis and has failed non-surgical conservative treatments for greater than 12 weeks to includeNSAID's and/or analgesics, corticosteriod injections, viscosupplementation injections, flexibility and strengthening excercises, supervised PT with diminished ADL's post treatment, use of assistive devices, weight reduction as appropriate and activity modification.  Onset of symptoms was gradual, starting 5 years ago with gradually worsening course since that time. The patient noted no past surgery on the left knee(s).  Patient currently rates pain in the left knee(s) at 8 out of 10 with activity. Patient has night pain, worsening of pain with activity and weight bearing, pain that interferes with activities of daily living, pain with passive range of motion, crepitus and joint swelling.  Patient has evidence of subchondral cysts, subchondral sclerosis, periarticular osteophytes, joint subluxation and joint space narrowing by imaging studies. This patient has had failure of all reasonable conservative care. There is no active infection.  Patient Active Problem List   Diagnosis Date Noted  . Anemia 01/12/2015  . Bariatric surgery status 01/12/2015  . Hammer toe of left foot 12/11/2014  . Osteoarthritis of left hip 12/11/2014  . Osteoarthritis of both knees 11/28/2014  . Rupture of left quadriceps muscle post repair 11/28/2014   Past Medical History  Diagnosis Date  . Kidney stones   . Arthritis   . Iron deficiency anemia     Past Surgical History  Procedure Laterality Date  . 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 Reattached tendon Left 05/2011    LEFT LEG  . Colonoscopy      No prescriptions prior to admission   No Known Allergies   History  Substance Use Topics  . Smoking status: Light Tobacco Smoker    Types: Cigars  . Smokeless tobacco: Former Systems developer  . Alcohol Use: 0.0 oz/week    0 Standard drinks or equivalent per week     Comment: rare    Family History  Problem Relation Age of Onset  . Diabetes Mother      ROS ROS: I have reviewed the patient's review of systems thoroughly and there are no positive responses as relates to the HPI. Objective:  Physical Exam  Vital signs in last 24 hours:   Well-developed well-nourished patient in no acute distress. Alert and oriented x3 HEENT:within normal limits Cardiac: Regular rate and rhythm Pulmonary: Lungs clear to auscultation Abdomen: Soft and nontender.  Normal active bowel sounds  Musculoskeletal: (left knee obvious varus malalignment.  Painful range of motion.  2+ effusion.  Medial side laxity with good endpoint. Labs: Recent Results (from the past 2160 hour(s))  CBC     Status: Abnormal   Collection Time: 01/12/15  2:05 PM  Result Value Ref Range   WBC 7.2 4.0 - 10.5 K/uL   RBC 4.85 4.22 - 5.81 MIL/uL   Hemoglobin 12.1 (L) 13.0 - 17.0 g/dL   HCT 36.4 (L) 39.0 - 52.0 %   MCV 75.1 (L) 78.0 - 100.0 fL   MCH 24.9 (L) 26.0 - 34.0 pg   MCHC 33.2 30.0 - 36.0 g/dL   RDW 15.9 (H) 11.5 - 15.5 %   Platelets 247 150 - 400 K/uL   MPV 9.0 8.6 - 12.4 fL  Surgical pcr screen     Status: None   Collection Time: 01/21/15  9:37 AM  Result Value Ref Range   MRSA, PCR NEGATIVE NEGATIVE   Staphylococcus aureus NEGATIVE NEGATIVE    Comment:        The Xpert SA Assay (FDA approved for NASAL specimens in patients over 47 years of age), is one component of a comprehensive surveillance program.  Test performance has been validated by [facility] for patients greater than or equal to 14 year old. It is not intended to diagnose infection nor to guide or monitor treatment.   CBC     Status: Abnormal   Collection Time: 01/21/15  9:38 AM  Result Value Ref Range    WBC 5.3 4.0 - 10.5 K/uL   RBC 4.91 4.22 - 5.81 MIL/uL   Hemoglobin 12.4 (L) 13.0 - 17.0 g/dL   HCT 37.2 (L) 39.0 - 52.0 %   MCV 75.8 (L) 78.0 - 100.0 fL   MCH 25.3 (L) 26.0 - 34.0 pg   MCHC 33.3 30.0 - 36.0 g/dL   RDW 15.1 11.5 - 15.5 %   Platelets 210 150 - 400 K/uL  Protime-INR     Status: None   Collection Time: 01/21/15  9:38 AM  Result Value Ref Range   Prothrombin Time 14.1 11.6 - 15.2 seconds   INR 1.07 0.00 - 1.49  PTT     Status: None   Collection Time: 01/21/15  9:38 AM  Result Value Ref Range   aPTT 33 24 - 37 seconds  Type and screen     Status: None   Collection Time: 01/21/15  9:38 AM  Result Value Ref Range   ABO/RH(D) O POS    Antibody Screen NEG    Sample Expiration 02/04/2015   ABO/Rh     Status: None   Collection Time: 01/21/15  9:40 AM  Result Value Ref Range   ABO/RH(D) O POS   Urinalysis, Routine w reflex microscopic     Status: None   Collection Time: 02/23/15 10:09 AM  Result Value Ref Range   Color, Urine YELLOW YELLOW   APPearance CLEAR CLEAR   Specific Gravity, Urine 1.014 1.005 - 1.030   pH 6.0 5.0 - 8.0   Glucose, UA NEGATIVE NEGATIVE mg/dL   Hgb urine dipstick NEGATIVE NEGATIVE   Bilirubin Urine NEGATIVE NEGATIVE   Ketones, ur NEGATIVE NEGATIVE mg/dL   Protein, ur NEGATIVE NEGATIVE mg/dL   Urobilinogen, UA 1.0 0.0 - 1.0 mg/dL   Nitrite NEGATIVE NEGATIVE   Leukocytes, UA NEGATIVE NEGATIVE    Comment: MICROSCOPIC NOT DONE ON URINES WITH NEGATIVE PROTEIN, BLOOD, LEUKOCYTES, NITRITE, OR GLUCOSE <1000 mg/dL.  Surgical pcr screen     Status: None   Collection Time: 02/23/15 10:09 AM  Result Value Ref Range   MRSA, PCR NEGATIVE NEGATIVE   Staphylococcus aureus NEGATIVE NEGATIVE    Comment:        The Xpert SA Assay (FDA approved for NASAL specimens in patients over 56 years of age), is one component of a comprehensive surveillance program.  Test performance has been validated by [facility] for patients greater than or equal to 62  year old. It is not intended to diagnose infection nor to guide or monitor treatment.   APTT     Status: None   Collection Time: 02/23/15 10:10 AM  Result Value Ref Range   aPTT 32 24 - 37 seconds  Basic metabolic panel     Status: None   Collection Time: 02/23/15 10:10 AM  Result Value Ref Range   Sodium 138  135 - 145 mmol/L   Potassium 4.1 3.5 - 5.1 mmol/L   Chloride 104 101 - 111 mmol/L   CO2 27 22 - 32 mmol/L   Glucose, Bld 96 65 - 99 mg/dL   BUN 8 6 - 20 mg/dL   Creatinine, Ser 0.80 0.61 - 1.24 mg/dL   Calcium 9.2 8.9 - 10.3 mg/dL   GFR calc non Af Amer >60 >60 mL/min   GFR calc Af Amer >60 >60 mL/min    Comment: (NOTE) The eGFR has been calculated using the CKD EPI equation. This calculation has not been validated in all clinical situations. eGFR's persistently <60 mL/min signify possible Chronic Kidney Disease.    Anion gap 7 5 - 15  CBC WITH DIFFERENTIAL     Status: Abnormal   Collection Time: 02/23/15 10:10 AM  Result Value Ref Range   WBC 4.5 4.0 - 10.5 K/uL   RBC 5.27 4.22 - 5.81 MIL/uL   Hemoglobin 13.3 13.0 - 17.0 g/dL   HCT 39.4 39.0 - 52.0 %   MCV 74.8 (L) 78.0 - 100.0 fL   MCH 25.2 (L) 26.0 - 34.0 pg   MCHC 33.8 30.0 - 36.0 g/dL   RDW 15.1 11.5 - 15.5 %   Platelets 188 150 - 400 K/uL   Neutrophils Relative % 26 (L) 43 - 77 %   Neutro Abs 1.2 (L) 1.7 - 7.7 K/uL   Lymphocytes Relative 66 (H) 12 - 46 %   Lymphs Abs 2.9 0.7 - 4.0 K/uL   Monocytes Relative 8 3 - 12 %   Monocytes Absolute 0.4 0.1 - 1.0 K/uL   Eosinophils Relative 0 0 - 5 %   Eosinophils Absolute 0.0 0.0 - 0.7 K/uL   Basophils Relative 0 0 - 1 %   Basophils Absolute 0.0 0.0 - 0.1 K/uL  Protime-INR     Status: None   Collection Time: 02/23/15 10:10 AM  Result Value Ref Range   Prothrombin Time 14.6 11.6 - 15.2 seconds   INR 1.12 0.00 - 1.49  Type and screen     Status: None   Collection Time: 02/23/15 10:25 AM  Result Value Ref Range   ABO/RH(D) O POS    Antibody Screen NEG     Sample Expiration 03/09/2015     Estimated body mass index is 32.64 kg/(m^2) as calculated from the following:   Height as of 01/21/15: 5' 11"  (1.803 m).   Weight as of 01/21/15: 233 lb 14.4 oz (106.096 kg).   Imaging Review Plain radiographs demonstrate severe degenerative joint disease of the bilaterally knee(s). The overall alignment issignificant varus. The bone quality appears to be fair for age and reported activity level.  Assessment/Plan:  End stage arthritis, left knee   The patient history, physical examination, clinical judgment of the provider and imaging studies are consistent with end stage degenerative joint disease of the left knee(s) and total knee arthroplasty is deemed medically necessary. The treatment options including medical management, injection therapy arthroscopy and arthroplasty were discussed at length. The risks and benefits of total knee arthroplasty were presented and reviewed. The risks due to aseptic loosening, infection, stiffness, patella tracking problems, thromboembolic complications and other imponderables were discussed. The patient acknowledged the explanation, agreed to proceed with the plan and consent was signed. Patient is being admitted for inpatient treatment for surgery, pain control, PT, OT, prophylactic antibiotics, VTE prophylaxis, progressive ambulation and ADL's and discharge planning. The patient is planning to be discharged home with home health services

## 2015-03-06 NOTE — Anesthesia Postprocedure Evaluation (Signed)
  Anesthesia Post-op Note  Patient: Jordan Bailey  Procedure(s) Performed: Procedure(s): TOTAL KNEE ARTHROPLASTY (Left)  Patient Location: PACU  Anesthesia Type:General  Level of Consciousness: awake  Airway and Oxygen Therapy: Patient Spontanous Breathing  Post-op Pain: mild  Post-op Assessment: Post-op Vital signs reviewed  Post-op Vital Signs: Reviewed  Last Vitals:  Filed Vitals:   03/06/15 1316  BP: 119/70  Pulse: 42  Temp:   Resp: 6    Complications: No apparent anesthesia complications

## 2015-03-06 NOTE — Progress Notes (Signed)
Orthopedic Tech Progress Note Patient Details:  Daun PeacockRoger Gohman 04/01/1966 [id number]  CPM Left Knee CPM Left Knee: On Left Knee Flexion (Degrees): 90 Left Knee Extension (Degrees): 0 Additional Comments: trapeze bar patient helper Viewed order gfrom doctor's order list  Nikki DomCrawford, Finesse Fielder 03/06/2015, 12:42 PM

## 2015-03-06 NOTE — Discharge Instructions (Signed)
INSTRUCTIONS AFTER JOINT REPLACEMENT  ° °o Remove items at home which could result in a fall. This includes throw rugs or furniture in walking pathways °o ICE to the affected joint every three hours while awake for 30 minutes at a time, for at least the first 3-5 days, and then as needed for pain and swelling.  Continue to use ice for pain and swelling. You may notice swelling that will progress down to the foot and ankle.  This is normal after surgery.  Elevate your leg when you are not up walking on it.   °o Continue to use the breathing machine you got in the hospital (incentive spirometer) which will help keep your temperature down.  It is common for your temperature to cycle up and down following surgery, especially at night when you are not up moving around and exerting yourself.  The breathing machine keeps your lungs expanded and your temperature down. ° ° °DIET:  As you were doing prior to hospitalization, we recommend a well-balanced diet. ° °DRESSING / WOUND CARE / SHOWERING ° °You may change your dressing 3-5 days after surgery.  Then change the dressing every day with sterile gauze.  Please use good hand washing techniques before changing the dressing.  Do not use any lotions or creams on the incision until instructed by your surgeon. ° °ACTIVITY ° °o Increase activity slowly as tolerated, but follow the weight bearing instructions below.   °o No driving for 6 weeks or until further direction given by your physician.  You cannot drive while taking narcotics.  °o No lifting or carrying greater than 10 lbs. until further directed by your surgeon. °o Avoid periods of inactivity such as sitting longer than an hour when not asleep. This helps prevent blood clots.  °o You may return to work once you are authorized by your doctor.  ° ° ° °WEIGHT BEARING  ° °Weight bearing as tolerated with assist device (walker, cane, etc) as directed, use it as long as suggested by your surgeon or therapist, typically at  least 4-6 weeks. ° ° °EXERCISES ° °Results after joint replacement surgery are often greatly improved when you follow the exercise, range of motion and muscle strengthening exercises prescribed by your doctor. Safety measures are also important to protect the joint from further injury. Any time any of these exercises cause you to have increased pain or swelling, decrease what you are doing until you are comfortable again and then slowly increase them. If you have problems or questions, call your caregiver or physical therapist for advice.  ° °Rehabilitation is important following a joint replacement. After just a few days of immobilization, the muscles of the leg can become weakened and shrink (atrophy).  These exercises are designed to build up the tone and strength of the thigh and leg muscles and to improve motion. Often times heat used for twenty to thirty minutes before working out will loosen up your tissues and help with improving the range of motion but do not use heat for the first two weeks following surgery (sometimes heat can increase post-operative swelling).  ° °These exercises can be done on a training (exercise) mat, on the floor, on a table or on a bed. Use whatever works the best and is most comfortable for you.    Use music or television while you are exercising Caetano Oberhaus that the exercises are a pleasant break in your day. This will make your life better with the exercises acting as a break   in your routine that you can look forward to.   Perform all exercises about fifteen times, three times per day or as directed.  You should exercise both the operative leg and the other leg as well. ° °Exercises include: °  °• Quad Sets - Tighten up the muscle on the front of the thigh (Quad) and hold for 5-10 seconds.   °• Straight Leg Raises - With your knee straight (if you were given a brace, keep it on), lift the leg to 60 degrees, hold for 3 seconds, and slowly lower the leg.  Perform this exercise against  resistance later as your leg gets stronger.  °• Leg Slides: Lying on your back, slowly slide your foot toward your buttocks, bending your knee up off the floor (only go as far as is comfortable). Then slowly slide your foot back down until your leg is flat on the floor again.  °• Angel Wings: Lying on your back spread your legs to the side as far apart as you can without causing discomfort.  °• Hamstring Strength:  Lying on your back, push your heel against the floor with your leg straight by tightening up the muscles of your buttocks.  Repeat, but this time bend your knee to a comfortable angle, and push your heel against the floor.  You may put a pillow under the heel to make it more comfortable if necessary.  ° °A rehabilitation program following joint replacement surgery can speed recovery and prevent re-injury in the future due to weakened muscles. Contact your doctor or a physical therapist for more information on knee rehabilitation.  ° ° °CONSTIPATION ° °Constipation is defined medically as fewer than three stools per week and severe constipation as less than one stool per week.  Even if you have a regular bowel pattern at home, your normal regimen is likely to be disrupted due to multiple reasons following surgery.  Combination of anesthesia, postoperative narcotics, change in appetite and fluid intake all can affect your bowels.  ° °YOU MUST use at least one of the following options; they are listed in order of increasing strength to get the job done.  They are all available over the counter, and you may need to use some, POSSIBLY even all of these options:   ° °Drink plenty of fluids (prune juice may be helpful) and high fiber foods °Colace 100 mg by mouth twice a day  °Senokot for constipation as directed and as needed Dulcolax (bisacodyl), take with full glass of water  °Miralax (polyethylene glycol) once or twice a day as needed. ° °If you have tried all these things and are unable to have a bowel  movement in the first 3-4 days after surgery call either your surgeon or your primary doctor.   ° °If you experience loose stools or diarrhea, hold the medications until you stool forms back up.  If your symptoms do not get better within 1 week or if they get worse, check with your doctor.  If you experience "the worst abdominal pain ever" or develop nausea or vomiting, please contact the office immediately for further recommendations for treatment. ° ° °ITCHING:  If you experience itching with your medications, try taking only a single pain pill, or even half a pain pill at a time.  You can also use Benadryl over the counter for itching or also to help with sleep.  ° °TED HOSE STOCKINGS:  Use stockings on both legs until for at least 2 weeks or as   directed by physician office. They may be removed at night for sleeping. ° °MEDICATIONS:  See your medication summary on the “After Visit Summary” that nursing will review with you.  You may have some home medications which will be placed on hold until you complete the course of blood thinner medication.  It is important for you to complete the blood thinner medication as prescribed. ° °PRECAUTIONS:  If you experience chest pain or shortness of breath - call 911 immediately for transfer to the hospital emergency department.  ° °If you develop a fever greater that 101 F, purulent drainage from wound, increased redness or drainage from wound, foul odor from the wound/dressing, or calf pain - CONTACT YOUR SURGEON.   °                                                °FOLLOW-UP APPOINTMENTS:  If you do not already have a post-op appointment, please call the office for an appointment to be seen by your surgeon.  Guidelines for how soon to be seen are listed in your “After Visit Summary”, but are typically between 1-4 weeks after surgery. ° °OTHER INSTRUCTIONS:  ° °Knee Replacement:  Do not place pillow under knee, focus on keeping the knee straight while resting. CPM  instructions: 0-90 degrees, 2 hours in the morning, 2 hours in the afternoon, and 2 hours in the evening. Place foam block, curve side up under heel at all times except when in CPM or when walking.  DO NOT modify, tear, cut, or change the foam block in any way. ° °MAKE SURE YOU:  °• Understand these instructions.  °• Get help right away if you are not doing well or get worse.  ° ° °Thank you for letting us be a part of your medical care team.  It is a privilege we respect greatly.  We hope these instructions will help you stay on track for a fast and full recovery!  ° ° °Information on my medicine - XARELTO® (Rivaroxaban) ° °This medication education was reviewed with me or my healthcare representative as part of my discharge preparation.   ° °Why was Xarelto® prescribed for you? °Xarelto® was prescribed for you to reduce the risk of blood clots forming after orthopedic surgery. The medical term for these abnormal blood clots is venous thromboembolism (VTE). ° °What do you need to know about xarelto® ? °Take your Xarelto® 10 mg ONCE DAILY at the same time every day. °You may take it either with or without food. ° °If you have difficulty swallowing the tablet whole, you may crush it and mix in applesauce just prior to taking your dose. ° °Take Xarelto® exactly as prescribed by your doctor and DO NOT stop taking Xarelto® without talking to the doctor who prescribed the medication.  Stopping without other VTE prevention medication to take the place of Xarelto® may increase your risk of developing a clot. ° °After discharge, you should have regular check-up appointments with your healthcare provider that is prescribing your Xarelto®.   ° °What do you do if you miss a dose? °If you miss a dose, take it as soon as you remember on the same day then continue your regularly scheduled once daily regimen the next day. Do not take two doses of Xarelto® on the same day.  ° °Important Safety Information °A possible   side effect of  Xarelto® is bleeding. You should call your healthcare provider right away if you experience any of the following: °? Bleeding from an injury or your nose that does not stop. °? Unusual colored urine (red or dark brown) or unusual colored stools (red or black). °? Unusual bruising for unknown reasons. °? A serious fall or if you hit your head (even if there is no bleeding). ° °Some medicines may interact with Xarelto® and might increase your risk of bleeding while on Xarelto®. To help avoid this, consult your healthcare provider or pharmacist prior to using any new prescription or non-prescription medications, including herbals, vitamins, non-steroidal anti-inflammatory drugs (NSAIDs) and supplements. ° °This website has more information on Xarelto®: www.xarelto.com. ° ° ° °

## 2015-03-07 LAB — CBC
HEMATOCRIT: 35.7 % — AB (ref 39.0–52.0)
Hemoglobin: 12 g/dL — ABNORMAL LOW (ref 13.0–17.0)
MCH: 24.8 pg — ABNORMAL LOW (ref 26.0–34.0)
MCHC: 33.6 g/dL (ref 30.0–36.0)
MCV: 73.9 fL — ABNORMAL LOW (ref 78.0–100.0)
PLATELETS: 196 10*3/uL (ref 150–400)
RBC: 4.83 MIL/uL (ref 4.22–5.81)
RDW: 14.9 % (ref 11.5–15.5)
WBC: 10 10*3/uL (ref 4.0–10.5)

## 2015-03-07 LAB — BASIC METABOLIC PANEL
Anion gap: 11 (ref 5–15)
BUN: 7 mg/dL (ref 6–20)
CO2: 22 mmol/L (ref 22–32)
CREATININE: 0.88 mg/dL (ref 0.61–1.24)
Calcium: 9 mg/dL (ref 8.9–10.3)
Chloride: 102 mmol/L (ref 101–111)
GFR calc Af Amer: >60 mL/min (ref >60)
GFR calc non Af Amer: >60 mL/min (ref >60)
GLUCOSE: 130 mg/dL — AB (ref 65–99)
Potassium: 3.9 mmol/L (ref 3.5–5.1)
Sodium: 135 mmol/L (ref 135–145)

## 2015-03-07 NOTE — Progress Notes (Signed)
Subjective: 1 Day Post-Op Procedure(s) (LRB): TOTAL KNEE ARTHROPLASTY (Left) Patient reports pain as mild.  Having minimal if any pain. Taking by mouth and voiding okay.  Objective: Vital signs in last 24 hours: Temp:  [97.3 F (36.3 C)-97.9 F (36.6 C)] 97.8 F (36.6 C) (06/04 0634) Pulse Rate:  [42-79] 56 (06/04 0634) Resp:  [7-27] 17 (06/04 0634) BP: (97-144)/(49-94) 99/49 mmHg (06/04 0634) SpO2:  [97 %-100 %] 100 % (06/04 0634)  Intake/Output from previous day: 06/03 0701 - 06/04 0700 In: 2150 [I.V.:2150] Out: 1870 [Urine:650; Drains:1220] Intake/Output this shift: Total I/O In: 240 [P.O.:240] Out: -    Recent Labs  03/07/15 0532  HGB 12.0*    Recent Labs  03/07/15 0532  WBC 10.0  RBC 4.83  HCT 35.7*  PLT 196    Recent Labs  03/07/15 0532  NA 135  K 3.9  CL 102  CO2 22  BUN 7  CREATININE 0.88  GLUCOSE 130*  CALCIUM 9.0   No results for input(s): LABPT, INR in the last 72 hours. Left knee exam: Neurovascular intact Sensation intact distally Intact pulses distally Dorsiflexion/Plantar flexion intact Incision: dressing C/D/I Compartment soft  Assessment/Plan: 1 Day Post-Op Procedure(s) (LRB): TOTAL KNEE ARTHROPLASTY (Left) Plan: Xarelto 10 mg daily for DVT prophylaxis along with SCDs. Drain pulled. Up with therapy Plan for discharge tomorrow  Dary Dilauro G 03/07/2015, 8:51 AM

## 2015-03-07 NOTE — Care Management Note (Signed)
Case Management Note  Patient Details  Name: Jordan Bailey MRN: [id number] Date of Birth: April 21, 1966  Subjective/Objective:  49 yo M underwent L TKA.                 Action/Plan: HHPT and DME   Expected Discharge Date: 03/08/15                  Expected Discharge Plan:  [employer]  In-House Referral:     Discharge planning Services  CM Consult  Post Acute Care Choice:    Choice offered to:     DME Arranged:    DME Agency:     HH Arranged:    [place] Agency:     Status of Service:  Completed, signed off  Medicare Important Message Given:    Date Medicare IM Given:    Medicare IM give by:    Date Additional Medicare IM Given:    Additional Medicare Important Message give by:     If discussed at [place] of Stay Meetings, dates discussed:    Additional Comments: met with pt to discuss D/C plan, HHPT and DME. Pt plans to return home with the support of his wife and kids. The physician's office arranged DME and HHPT prior to surgery. The 3 N 1 BSC, RW and the CPM were delivered prior to surgery and these was arranged with [facility]. HHPT was arranged with [facility].  Norina Buzzard, RN 03/07/2015, 9:39 AM

## 2015-03-07 NOTE — Progress Notes (Signed)
Orthopedic Tech Progress Note Patient Details:  Jordan PeacockRoger Bailey 07/17/1966 [id number]  Ortho Devices Type of Ortho Device: Other (comment) Ortho Device/Splint Location: Bone Foam Ortho Device/Splint Interventions: Ordered   Jordan Bailey 03/07/2015, 12:22 PM

## 2015-03-07 NOTE — Progress Notes (Signed)
Physical Therapy Treatment Patient Details Name: Jordan PeacockRoger Hosea MRN: [id number] DOB: 01/26/1966 Today's Date: 03/07/2015    History of Present Illness Pt is a 49 y/o M s/p L TKA.  Pt's PMH includes anemia, bariatric surgery, hammer toe L foot, OA L hip, OA B knees, rupture of L quad muscle.    PT Comments    Pt progressing with mobility.  Increased ambulation distance & initiated stair training.  Cont with current POC.   Follow Up Recommendations  Home health PT;Supervision for mobility/OOB     Equipment Recommendations  None recommended by PT    Recommendations for Other Services       Precautions / Restrictions Precautions Precautions: Knee;Fall Precaution Comments: Reviewed no pillow under knee Required Braces or Orthoses: Knee Immobilizer - Left Knee Immobilizer - Left: On when out of bed or walking Restrictions LLE Weight Bearing: Weight bearing as tolerated    Mobility  Bed Mobility Overal bed mobility: Needs Assistance Bed Mobility: Supine to Sit     Supine to sit: Min guard        Transfers Overall transfer level: Needs assistance Equipment used: Rolling walker (2 wheeled) Transfers: Sit to/from Stand Sit to Stand: Min guard         General transfer comment: cues to reinforce hand placement  Ambulation/Gait Ambulation/Gait assistance: Min guard Ambulation Distance (Feet): 300 Feet Assistive device: Rolling walker (2 wheeled) Gait Pattern/deviations: Step-to pattern;Step-through pattern;Decreased weight shift to left;Decreased step length - right Gait velocity: decreased   General Gait Details: Step to gait pattern with moments of step through pattern.  Lacks terminal knee extension LLE.     Stairs Stairs: Yes Stairs assistance: Min guard Stair Management: Two rails;Forwards;Step to pattern Number of Stairs: 2 General stair comments: cues for sequencing & technique  Wheelchair Mobility    Modified Rankin (Stroke Patients Only)        Balance                                    Cognition Arousal/Alertness: Awake/alert Behavior During Therapy: WFL for tasks assessed/performed Overall Cognitive Status: Within Functional Limits for tasks assessed                      Exercises      General Comments        Pertinent Vitals/Pain Pain Assessment: 0-10 Pain Score: 4  Pain Location: Lt knee Pain Descriptors / Indicators: Discomfort Pain Intervention(s): Monitored during session;Premedicated before session;Repositioned    Home Living                      Prior Function            PT Goals (current goals can now be found in the care plan section) Acute Rehab PT Goals Patient Stated Goal: to get stronger and go home PT Goal Formulation: With patient Time For Goal Achievement: 03/13/15 Potential to Achieve Goals: Good Progress towards PT goals: Progressing toward goals    Frequency  7X/week    PT Plan Current plan remains appropriate    Co-evaluation             End of Session Equipment Utilized During Treatment: Left knee immobilizer Activity Tolerance: Patient tolerated treatment well Patient left: in chair;with call bell/phone within reach;with family/visitor present     Time: [id number] PT Time Calculation (min) (ACUTE ONLY): 47 min  Charges:  $  Gait Training: 23-37 mins $Therapeutic Activity: 8-22 mins                    G Codes:      Lara Mulch 03/07/2015, 1:20 PM   Verdell Face, PTA [phone] 03/07/2015

## 2015-03-07 NOTE — Progress Notes (Signed)
Physical Therapy Treatment Patient Details Name: Jordan Bailey MRN: [id number] DOB: 05/16/1966 Today's Date: 03/07/2015    History of Present Illness Pt is a 49 y/o M s/p L TKA.  Pt's PMH includes anemia, bariatric surgery, hammer toe L foot, OA L hip, OA B knees, rupture of L quad muscle.    PT Comments    Pt cont's to make progress with mobility.  Foam block for knee extension delivered to pt's room between session.  Positioned pt's Lt foot on block to promote knee extension.    Follow Up Recommendations  Home health PT;Supervision for mobility/OOB     Equipment Recommendations  None recommended by PT    Recommendations for Other Services       Precautions / Restrictions Precautions Precautions: Knee;Fall Precaution Comments: Reviewed no pillow under knee Required Braces or Orthoses: Knee Immobilizer - Left Knee Immobilizer - Left: On when out of bed or walking Restrictions LLE Weight Bearing: Weight bearing as tolerated    Mobility  Bed Mobility Overal bed mobility: Needs Assistance Bed Mobility: Supine to Sit     Supine to sit: Min guard     General bed mobility comments: Pt sitting in recliner at beginning & end of session  Transfers Overall transfer level: Needs assistance Equipment used: Rolling walker (2 wheeled) Transfers: Sit to/from Stand Sit to Stand: Min guard         General transfer comment: cues to reinforce hand placement  Ambulation/Gait Ambulation/Gait assistance: Min guard Ambulation Distance (Feet): 200 Feet Assistive device: Rolling walker (2 wheeled) Gait Pattern/deviations: Step-to pattern;Step-through pattern;Decreased weight shift to left Gait velocity: decreased   General Gait Details: focus on step through gait pattern with increased Lt knee extension during stance phase.    Stairs Stairs: Yes Stairs assistance: Min guard Stair Management: Two rails;Forwards;Step to pattern Number of Stairs: 2 General stair comments: cues  for sequencing & technique  Wheelchair Mobility    Modified Rankin (Stroke Patients Only)       Balance                                    Cognition Arousal/Alertness: Awake/alert Behavior During Therapy: WFL for tasks assessed/performed Overall Cognitive Status: Within Functional Limits for tasks assessed                      Exercises Total Joint Exercises Ankle Circles/Pumps: AROM;Both;10 reps Quad Sets: AROM;Both;10 reps Hip ABduction/ADduction: AAROM;Strengthening;Left;10 reps Straight Leg Raises: AAROM;Strengthening;Left;10 reps    General Comments        Pertinent Vitals/Pain Pain Assessment: 0-10 Pain Score: 3  Pain Location: lt knee Pain Descriptors / Indicators: Discomfort Pain Intervention(s): Monitored during session;Premedicated before session;Repositioned    Home Living                      Prior Function            PT Goals (current goals can now be found in the care plan section) Acute Rehab PT Goals Patient Stated Goal: to get stronger and go home PT Goal Formulation: With patient Time For Goal Achievement: 03/13/15 Potential to Achieve Goals: Good Progress towards PT goals: Progressing toward goals    Frequency  7X/week    PT Plan Current plan remains appropriate    Co-evaluation             End of Session Equipment Utilized  During Treatment: Left knee immobilizer Activity Tolerance: Patient tolerated treatment well Patient left: in chair;with call bell/phone within reach (foam block under Lt ankle)     Time: [id number] PT Time Calculation (min) (ACUTE ONLY): 42 min  Charges:  $Gait Training: 23-37 mins $Therapeutic Exercise: 8-22 mins $Therapeutic Activity: 8-22 mins                    G Codes:      Lara MulchCooper, Kashayla Ungerer Lynn 03/07/2015, 2:56 PM   Verdell FaceKelly Anna Livers, PTA [phone] 03/07/2015

## 2015-03-07 NOTE — Op Note (Signed)
Jordan Bailey, Jordan Bailey                ACCOUNT NO.:  [id number]  MEDICAL RECORD NO.:  [id number]  LOCATION:  [id number]                        FACILITY:  [facility]  PHYSICIAN:  Harvie Junior, M.D.   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  PRINCIPAL PROCEDURE:  Left total knee replacement of severely arthritic left knee with a Sigma system, size 3 femur, size 4 tibia, 15 mm bridging bearing, and a 41 mm all-polyethylene patella.  SURGEON:  Harvie Junior, M.D.  ASSISTANT:  Marshia Ly, P.A.  ANESTHESIA:  General.  BRIEF HISTORY:  Mr. Tavano is a 49 year old male with a long history of having been severely overweight and over 500 pounds.  He has undergone gastric bypass and severe weight loss and ultimately has gotten his BMI down to well below 40.  He has severe bilateral knee pain and his left knee was worse than his right.  He was having significant pain and failed all conservative care including activity modification, use of a cane, significant weight loss as outlined.  He was taken to the operating room for left total knee replacement with x-ray showing severe osteophyte formation, severe bone-on-bone change, subluxation of the tibia, and cystic change.  DESCRIPTION OF PROCEDURE:  The patient was taken to the operating room. After adequate anesthesia was obtained with general anesthetic, the patient was placed supine on the operating table, the left leg was then prepped and draped in usual sterile fashion.  Following this, his old excision was exploited which had really gone into a lateral direction, but given that it was only 49  years old, I felt we needed to use it.  We dissected down to the level of the patellar tendon and a medial parapatellar arthrotomy was undertaken after we exposed the quadriceps tendon proximally.  There were multiple sutures within the quadriceps tendon and it was unclear exactly what their function was, but we left the sutures alone.  Following this, attention was turned into the knee where there was a severe osteoarthritis of all 3 compartments.  An intramedullary pilot hole was drilled in the femur after medial and lateral meniscus were removed, retropatellar fat pad was removed, synovium on the anterior aspect of the femur was removed, and anterior and posterior cruciates were removed.  Once the intramedullary hole was drilled, we used a 4 degree valgus alignment and resected 11 mm of distal bone.  Once this was done, attention was turned towards sizing the femur which even though there were gigantic osteophytes, it actually sized down to a 3.  We did anterior-posterior cuts, chamfers and box, and removed all these osteophytes.  We centered up the femoral component as it was slightly wider in the AP diameter and the anterior-to- posterior diameter.  Once this was done, attention was turned to the tibia and an extramedullary guide was used and we resected the tibia right at the  depth of the tibial defect.  At this point, we had a fairly large extension gap of 15 mm.  Flexion gap was fine at this level and symmetric.  The tibia was drilled and keeled and I used an MBT revision tray because this preoperative x-rays showed to my mind some concern that his cortical bone was not that dense even though he had the large weight for a prolonged time, I think he may have had some malnutrition through his weight loss and it appeared to me that his bone was not as dense as I would like and obviously there was concern that he could regain this weight.  A 3 degree posterior angled cut was made for  an MBT revision tray and a size 4 was chosen, it was drilled and keeled, and a trial MBT 4 was placed, a trial 3 was placed, 12.5 bearing was placed, little bit loose and went to a 15 which was great and flexion and extension perfectly balanced.  Once this was completed, we turned towards the patella, cut down to a level of 13 mm and a 41 paddle was chosen and lugs were drilled for this.  Lugs were drilled in the femur and we put the knee through a range of motion.  Excellent stability was achieved at this point.  Following this, all trials were removed.  The knee was copiously and thoroughly lavaged, pulsatile lavage irrigation and suctioned dry.  The final components were cemented into place with a size 3 femur, size 4 MBT revision style tray, a 41 mm all-polyethylene patella, and a 15 mm bridging bearing trial was placed, and allowed the cement to harden.  All excess bone cement was removed.  The cement was allowed to completely harden.  Once this was done, the knee was put through a range of motion.  The tourniquet was let down and all bleeding was controlled with electrocautery.  We did put 2 g of tranexamic acid in 50 mL of saline into the knee and let this sit for 3 minutes after the cement was hardened.  The tourniquet was let down.  All bleeding was controlled with electrocautery.  The 15 poly was opened and placed.  The knee put through a range of motion now, had excellent stability and range of motion.  The medial parapatellar arthrotomy was closed with 1 Vicryl running.  The skin was closed with 0 and 2-0 Vicryl and skin staples.  Sterile compressive dressing was applied.  The patient was taken to the recovery room and was noted to be in satisfactory condition.  Estimated blood loss for procedure was minimal.     Harvie JuniorJohn L. Kachina Niederer, M.D.     Ranae PlumberJLG/MEDQ  D:  03/06/2015  T:  03/07/2015  Job:  [id number]  cc:   Harvie JuniorJohn L. Maquita Sandoval, M.D.

## 2015-03-08 LAB — CBC
HCT: 27.5 % — ABNORMAL LOW (ref 39.0–52.0)
Hemoglobin: 9.3 g/dL — ABNORMAL LOW (ref 13.0–17.0)
MCH: 24.8 pg — AB (ref 26.0–34.0)
MCHC: 33.8 g/dL (ref 30.0–36.0)
MCV: 73.3 fL — AB (ref 78.0–100.0)
Platelets: 160 10*3/uL (ref 150–400)
RBC: 3.75 MIL/uL — ABNORMAL LOW (ref 4.22–5.81)
RDW: 14.8 % (ref 11.5–15.5)
WBC: 9.9 10*3/uL (ref 4.0–10.5)

## 2015-03-08 NOTE — Progress Notes (Signed)
Physical Therapy Treatment Patient Details Name: Jordan PeacockRoger Surgeon MRN: [id number] DOB: 06/25/1966 Today's Date: 03/08/2015    History of Present Illness Pt is a 49 y/o M s/p L TKA.  Pt's PMH includes anemia, bariatric surgery, hammer toe L foot, OA L hip, OA B knees, rupture of L quad muscle.    PT Comments    Session focused on stair negotiation and gait mechanics. Patient demonstrates ability to negotiate stairs safely and ambulate household distances. Appropriate to D/C home at this time with continuation of therapy with home health.    Follow Up Recommendations  Home health PT;Supervision for mobility/OOB     Equipment Recommendations  None recommended by PT    Recommendations for Other Services       Precautions / Restrictions Precautions Precautions: Knee;Fall Precaution Comments:  (reviewed and used patient teach back) Required Braces or Orthoses: Knee Immobilizer - Left Knee Immobilizer - Left: On when out of bed or walking Restrictions Weight Bearing Restrictions: Yes LLE Weight Bearing: Weight bearing as tolerated    Mobility  Bed Mobility Overal bed mobility: Needs Assistance (assist with lifting left leg) Bed Mobility: Supine to Sit     Supine to sit: Min assist     General bed mobility comments: Pt in bed in CPM machine at beginning of session. Knee immobilizer donned after removing CPM. Pt notes increased effort with lifting L leg compared to yesterday.   Transfers Overall transfer level: Needs assistance Equipment used: Rolling walker (2 wheeled) Transfers: Sit to/from Stand Sit to Stand: Min guard         General transfer comment: cues for hand placement, foot placement and weight bearing through both lower extremity.  (transfer with higher bed to simulate bed height at home. )  Ambulation/Gait Ambulation/Gait assistance: Min guard Ambulation Distance (Feet): 300 Feet Assistive device: Rolling walker (2 wheeled) Gait Pattern/deviations: Step-to  pattern;Decreased stance time - left;Decreased weight shift to left;Trunk flexed (required many cues for upright posture) Gait velocity: decreased Gait velocity interpretation: Below normal speed for age/gender General Gait Details: increased trunk flexion with prolonged ambulation requiring constant cues. Pt stopped periodically to stand on L leg to feel "stretch" in knee.    Stairs Stairs: Yes Stairs assistance: Min guard Stair Management: One rail Left;Sideways;Step to pattern Number of Stairs: 12 General stair comments: Stair negotiation using left railing, sideways step to pattern leading with the right leg. Pt demonstrated ability to navigate up and down 12 stairs.   Wheelchair Mobility    Modified Rankin (Stroke Patients Only)       Balance           Standing balance support: No upper extremity supported (when donning shorts prior to ambulation) Standing balance-Leahy Scale: Fair                      Cognition Arousal/Alertness: Awake/alert Behavior During Therapy: WFL for tasks assessed/performed Overall Cognitive Status: Within Functional Limits for tasks assessed                      Exercises      General Comments General comments (skin integrity, edema, etc.): Pt noted soreness and stiffness in knee after exercised yesterday. Dressing was dry and clean upon inspection.       Pertinent Vitals/Pain Pain Assessment: 0-10 Pain Score: 2  (grimacing expression with movement, but denies pain increase) Pain Location: Left knee Pain Descriptors / Indicators: Discomfort;Sore;Tightness Pain Intervention(s): Monitored during session;Premedicated before session;Repositioned  Home Living                      Prior Function            PT Goals (current goals can now be found in the care plan section) Acute Rehab PT Goals Patient Stated Goal: to go home today PT Goal Formulation: With patient Time For Goal Achievement:  03/13/15 Potential to Achieve Goals: Good Progress towards PT goals: Progressing toward goals    Frequency  7X/week    PT Plan Current plan remains appropriate    Co-evaluation             End of Session Equipment Utilized During Treatment: Left knee immobilizer Activity Tolerance: Patient tolerated treatment well Patient left: in chair;with call bell/phone within reach (with foam block under Left ankle)     Time: 0454-0981 PT Time Calculation (min) (ACUTE ONLY): 48 min  Charges:                       G Codes:      Adisen Bennion, Rachael 16-Mar-2015, 1:55 PM\   Rachael Shahmeer Bunn, SPT Acute Rehab Services [phone]

## 2015-03-08 NOTE — Discharge Summary (Signed)
Patient ID: Jordan Bailey MRN: [id number] DOB/AGE: 48/10/1965 48 y.o.  Admit date: 03/06/2015 Discharge date: 03/08/2015  Admission Diagnoses:  Principal Problem:   Primary osteoarthritis of left knee Active Problems:   Obesity   Discharge Diagnoses:  Same  Past Medical History  Diagnosis Date  . Kidney stones   . Arthritis   . Iron deficiency anemia     Surgeries: Procedure(s): Left TOTAL KNEE ARTHROPLASTY on 03/06/2015   Discharged Condition: Improved  Hospital Course: Jordan Bailey is an 49 y.o. male who was admitted 03/06/2015 for operative treatment ofPrimary osteoarthritis of left knee. Patient has severe unremitting pain that affects sleep, daily activities, and work/hobbies. After pre-op clearance the patient was taken to the operating room on 03/06/2015 and underwent  Procedure(s): Left TOTAL KNEE ARTHROPLASTY.    Patient was given perioperative antibiotics: Anti-infectives    Start     Dose/Rate Route Frequency Ordered Stop   03/06/15 1245  ceFAZolin (ANCEF) IVPB 2 g/50 mL premix     2 g 100 mL/hr over 30 Minutes Intravenous Every 6 hours 03/06/15 1238 03/07/15 0044   03/06/15 0730  ceFAZolin (ANCEF) IVPB 2 g/50 mL premix     2 g 100 mL/hr over 30 Minutes Intravenous To Surgery 03/05/15 0950 03/06/15 0930       Patient was given sequential compression devices, early ambulation, and chemoprophylaxis to prevent DVT. The Hemovac drain was pulled on postoperative day #1.  Patient benefited maximally from hospital stay and there were no complications.    Recent vital signs: Patient Vitals for the past 24 hrs:  BP Temp Temp src Pulse Resp SpO2  03/08/15 0508 119/79 mmHg 98 F (36.7 C) Oral 83 17 100 %  03/07/15 2056 (!) 114/99 mmHg 98.7 F (37.1 C) Oral (!) 56 16 97 %     Recent laboratory studies:  Recent Labs  03/07/15 0532 03/08/15 0640  WBC 10.0 9.9  HGB 12.0* 9.3*  HCT 35.7* 27.5*  PLT 196 160  NA 135  --   K 3.9  --   CL 102  --   CO2 22  --   BUN 7  --    CREATININE 0.88  --   GLUCOSE 130*  --   CALCIUM 9.0  --      Discharge Medications:     Medication List    STOP taking these medications        traMADol 50 MG tablet  Commonly known as:  ULTRAM      TAKE these medications        docusate sodium 100 MG capsule  Commonly known as:  COLACE  Take 100 mg by mouth daily.     ferrous sulfate 325 (65 FE) MG EC tablet  Take 1 tablet (325 mg total) by mouth 3 (three) times daily with meals.     Iron Tabs  Take 4 tablets by mouth every evening.     methocarbamol 750 MG tablet  Commonly known as:  ROBAXIN-750  Take 1 tablet (750 mg total) by mouth every 8 (eight) hours as needed for muscle spasms.     multivitamin with minerals tablet  Take 1 tablet by mouth daily.     oxyCODONE-acetaminophen 5-325 MG per tablet  Commonly known as:  PERCOCET/ROXICET  Take 1-2 tablets by mouth every 4 (four) hours as needed for severe pain.     rivaroxaban 10 MG Tabs tablet  Commonly known as:  XARELTO  Take 1 tablet (10 mg total) by mouth daily.  Take until gone.        Diagnostic Studies: Dg Chest 2 View  02/23/2015   CLINICAL DATA:  Preoperative left knee replacement  EXAM: CHEST  2 VIEW  COMPARISON:  None.  FINDINGS: There is no edema or consolidation. Heart size and pulmonary vascularity are normal. No adenopathy. There is mid thoracic dextroscoliosis and lower thoracic levoscoliosis.  IMPRESSION: No edema or consolidation.   Electronically Signed   By: Bretta Bang III M.D.   On: 02/23/2015 11:00    Disposition: 01-Home or Self Care      Discharge Instructions    CPM    Complete by:  As directed   Continuous passive motion machine (CPM):      Use the CPM from 0 to 60 for 8 hours per day.      You may increase by 5-10 per day.  You may break it up into 2 or 3 sessions per day.      Use CPM for 1-2 weeks or until you are told to stop.     Call MD / Call 911    Complete by:  As directed   If you experience chest pain or  shortness of breath, CALL 911 and be transported to the hospital emergency room.  If you develope a fever above 101 F, pus (white drainage) or increased drainage or redness at the wound, or calf pain, call your surgeon's office.     Constipation Prevention    Complete by:  As directed   Drink plenty of fluids.  Prune juice may be helpful.  You may use a stool softener, such as Colace (over the counter) 100 mg twice a day.  Use MiraLax (over the counter) for constipation as needed.     Diet general    Complete by:  As directed      Do not put a pillow under the knee. Place it under the heel.    Complete by:  As directed      Increase activity slowly as tolerated    Complete by:  As directed      Weight bearing as tolerated    Complete by:  As directed   Laterality:  left  Extremity:  Lower     Weight bearing as tolerated    Complete by:  As directed   Laterality:  left  Extremity:  Lower           Follow-up Information    Follow up with GRAVES,JOHN L, MD. Schedule an appointment as soon as possible for a visit in 2 weeks.   Specialty:  Orthopedic Surgery   Contact information:   [address] [place] [address] [phone]       Follow up with [facility].   Specialty:  Home Health Services   Why:  They will contact you to schedule home therapy visits.   Contact information:   [address] [place] [address] [phone]       Follow up with [facility].   Specialty:  Home Health Services   Why:  Home Health Physical Therapy arranged with [facility] care by the surgeon's office   Contact information:   [address] [place] [address] [phone]        Signed: Matthew Folks 03/08/2015, 3:47 PM

## 2015-03-08 NOTE — Progress Notes (Signed)
Subjective: 2 Days Post-Op Procedure(s) (LRB): TOTAL KNEE ARTHROPLASTY (Left) Patient reports pain as mild.  Taking by mouth and voiding okay. No BM yet. Positive flatus.  Objective: Vital signs in last 24 hours: Temp:  [98 F (36.7 C)-98.7 F (37.1 C)] 98 F (36.7 C) (06/05 0508) Pulse Rate:  [56-83] 83 (06/05 0508) Resp:  [16-17] 17 (06/05 0508) BP: (114-119)/(79-99) 119/79 mmHg (06/05 0508) SpO2:  [97 %-100 %] 100 % (06/05 0508)  Intake/Output from previous day: 06/04 0701 - 06/05 0700 In: 720 [P.O.:720] Out: 1950 [Urine:1950] Intake/Output this shift:     Recent Labs  03/07/15 0532 03/08/15 0640  HGB 12.0* 9.3*    Recent Labs  03/07/15 0532 03/08/15 0640  WBC 10.0 9.9  RBC 4.83 3.75*  HCT 35.7* 27.5*  PLT 196 160    Recent Labs  03/07/15 0532  NA 135  K 3.9  CL 102  CO2 22  BUN 7  CREATININE 0.88  GLUCOSE 130*  CALCIUM 9.0   Left knee exam: Neurovascular intact Sensation intact distally Intact pulses distally Dorsiflexion/Plantar flexion intact Incision: dressing C/D/I Compartment soft  Assessment/Plan: 2 Days Post-Op Procedure(s) (LRB): TOTAL KNEE ARTHROPLASTY (Left) Plan: Up with therapy Discharge home with home health Xarelto 10 mg daily 3 weeks postop for DVT prophylaxis Follow-up with Dr. Luiz BlareGraves in 2 weeks.  Jordan Bailey 03/08/2015, 9:30 AM

## 2015-03-09 ENCOUNTER — Encounter (HOSPITAL_COMMUNITY): Payer: Self-pay | Admitting: Orthopedic Surgery

## 2015-03-09 DIAGNOSIS — M1711 Unilateral primary osteoarthritis, right knee: Secondary | ICD-10-CM | POA: Diagnosis not present

## 2015-03-09 DIAGNOSIS — Z96652 Presence of left artificial knee joint: Secondary | ICD-10-CM | POA: Diagnosis not present

## 2015-03-09 DIAGNOSIS — Z471 Aftercare following joint replacement surgery: Secondary | ICD-10-CM | POA: Diagnosis not present

## 2015-03-09 LAB — BODY FLUID CULTURE
CULTURE: NO GROWTH
Gram Stain: NONE SEEN

## 2015-03-11 DIAGNOSIS — Z96652 Presence of left artificial knee joint: Secondary | ICD-10-CM | POA: Diagnosis not present

## 2015-03-11 DIAGNOSIS — Z471 Aftercare following joint replacement surgery: Secondary | ICD-10-CM | POA: Diagnosis not present

## 2015-03-11 DIAGNOSIS — M1711 Unilateral primary osteoarthritis, right knee: Secondary | ICD-10-CM | POA: Diagnosis not present

## 2015-03-11 LAB — ANAEROBIC CULTURE

## 2015-03-13 DIAGNOSIS — M1711 Unilateral primary osteoarthritis, right knee: Secondary | ICD-10-CM | POA: Diagnosis not present

## 2015-03-13 DIAGNOSIS — Z471 Aftercare following joint replacement surgery: Secondary | ICD-10-CM | POA: Diagnosis not present

## 2015-03-13 DIAGNOSIS — Z96652 Presence of left artificial knee joint: Secondary | ICD-10-CM | POA: Diagnosis not present

## 2015-03-16 DIAGNOSIS — Z471 Aftercare following joint replacement surgery: Secondary | ICD-10-CM | POA: Diagnosis not present

## 2015-03-16 DIAGNOSIS — M1711 Unilateral primary osteoarthritis, right knee: Secondary | ICD-10-CM | POA: Diagnosis not present

## 2015-03-16 DIAGNOSIS — Z96652 Presence of left artificial knee joint: Secondary | ICD-10-CM | POA: Diagnosis not present

## 2015-03-18 DIAGNOSIS — Z471 Aftercare following joint replacement surgery: Secondary | ICD-10-CM | POA: Diagnosis not present

## 2015-03-18 DIAGNOSIS — M1711 Unilateral primary osteoarthritis, right knee: Secondary | ICD-10-CM | POA: Diagnosis not present

## 2015-03-18 DIAGNOSIS — Z96652 Presence of left artificial knee joint: Secondary | ICD-10-CM | POA: Diagnosis not present

## 2015-03-20 DIAGNOSIS — Z96652 Presence of left artificial knee joint: Secondary | ICD-10-CM | POA: Diagnosis not present

## 2015-03-20 DIAGNOSIS — M1711 Unilateral primary osteoarthritis, right knee: Secondary | ICD-10-CM | POA: Diagnosis not present

## 2015-03-20 DIAGNOSIS — Z471 Aftercare following joint replacement surgery: Secondary | ICD-10-CM | POA: Diagnosis not present

## 2015-03-23 DIAGNOSIS — Z96652 Presence of left artificial knee joint: Secondary | ICD-10-CM | POA: Diagnosis not present

## 2015-03-23 DIAGNOSIS — Z471 Aftercare following joint replacement surgery: Secondary | ICD-10-CM | POA: Diagnosis not present

## 2015-03-24 DIAGNOSIS — M1711 Unilateral primary osteoarthritis, right knee: Secondary | ICD-10-CM | POA: Diagnosis not present

## 2015-03-24 DIAGNOSIS — Z96652 Presence of left artificial knee joint: Secondary | ICD-10-CM | POA: Diagnosis not present

## 2015-03-24 DIAGNOSIS — Z471 Aftercare following joint replacement surgery: Secondary | ICD-10-CM | POA: Diagnosis not present

## 2015-03-25 DIAGNOSIS — M1711 Unilateral primary osteoarthritis, right knee: Secondary | ICD-10-CM | POA: Diagnosis not present

## 2015-03-25 DIAGNOSIS — Z96652 Presence of left artificial knee joint: Secondary | ICD-10-CM | POA: Diagnosis not present

## 2015-03-25 DIAGNOSIS — Z471 Aftercare following joint replacement surgery: Secondary | ICD-10-CM | POA: Diagnosis not present

## 2015-03-30 ENCOUNTER — Ambulatory Visit: Payer: Medicare Other | Admitting: Physical Therapy

## 2015-04-07 ENCOUNTER — Ambulatory Visit (INDEPENDENT_AMBULATORY_CARE_PROVIDER_SITE_OTHER): Payer: Medicare Other | Admitting: Rehabilitative and Restorative Service Providers"

## 2015-04-07 ENCOUNTER — Encounter: Payer: Self-pay | Admitting: Rehabilitative and Restorative Service Providers"

## 2015-04-07 DIAGNOSIS — M6281 Muscle weakness (generalized): Secondary | ICD-10-CM | POA: Diagnosis not present

## 2015-04-07 DIAGNOSIS — R293 Abnormal posture: Secondary | ICD-10-CM

## 2015-04-07 DIAGNOSIS — R269 Unspecified abnormalities of gait and mobility: Secondary | ICD-10-CM

## 2015-04-07 DIAGNOSIS — M2560 Stiffness of unspecified joint, not elsewhere classified: Secondary | ICD-10-CM

## 2015-04-07 NOTE — Patient Instructions (Addendum)
Self-Mobilization: Knee Flexion (Prone)   Bring left heel toward buttocks as close as possible, using strap to pull knee into bent position. Hold __10__ seconds. Relax. Repeat _5___ times per set. Do _2-3___ sessions per day.  http://orth.exer.us/596   Copyright  VHI. All rights reserved.  Strengthening: Quadriceps Set   Tighten muscles on top of thighs by pushing knees down into surface. Hold __10__ seconds. Repeat __10__ times per set. Do _1-3___ sets per session. Do _2-3___ sessions per day.  http://orth.exer.us/602   Copyright  VHI. All rights reserved.  Strengthening: Straight Leg Raise (Phase 1)   Tighten muscles on front of right thigh, then lift leg _8-10___ inches from surface, keeping knee locked. May use the strap to help lifting your leg. Try to help lower your leg using the muscles in your leg. Repeat _5___ times per set. Do __1-3__ sets per session. Do __2-3__ sessions per day.  http://orth.exer.us/614   Copyright  VHI. All rights reserved.  Strengthening: Hip Abduction (Side-Lying)   Tighten muscles on front of left thigh, then lift leg 10____ inches from surface, keeping knee locked.  Repeat _10___ times per set. Do _2-3___ sets per session. Do _2-3___ sessions per day.  http://orth.exer.us/622   Copyright  VHI. All rights reserved.  Strengthening: Hip Adduction (Side-Lying)   Tighten muscles on front of right thigh, then lift leg __8__ inches from surface, keeping knee locked.  Repeat _10___ times per set. Do _2-3___ sets per session. Do _2-3___ sessions per day.

## 2015-04-07 NOTE — Patient Instructions (Signed)
Self-Mobilization: Knee Flexion (Prone)   Bring left heel toward buttocks as close as possible. Hold ____ seconds. Relax. Repeat ____ times per set. Do ____ sets per session. Do ____ sessions per day.  http://orth.exer.us/596   Copyright  VHI. All rights reserved.  Strengthening: Quadriceps Set   Tighten muscles on top of thighs by pushing knees down into surface. Hold ____ seconds. Repeat ____ times per set. Do ____ sets per session. Do ____ sessions per day.  http://orth.exer.us/602   Copyright  VHI. All rights reserved.  Strengthening: Straight Leg Raise (Phase 1)   Tighten muscles on front of right thigh, then lift leg ____ inches from surface, keeping knee locked.  Repeat ____ times per set. Do ____ sets per session. Do ____ sessions per day.  http://orth.exer.us/614   Copyright  VHI. All rights reserved.  Strengthening: Hip Abduction (Side-Lying)   Tighten muscles on front of left thigh, then lift leg ____ inches from surface, keeping knee locked.  Repeat ____ times per set. Do ____ sets per session. Do ____ sessions per day.  http://orth.exer.us/622   Copyright  VHI. All rights reserved.  Strengthening: Hip Adduction (Side-Lying)   Tighten muscles on front of right thigh, then lift leg ____ inches from surface, keeping knee locked.  Repeat ____ times per set. Do ____ sets per session. Do ____ sessions per day.  http://orth.exer.us/624   Copyright  VHI. All rights reserved.  Strengthening: Hip Adduction (Side-Lying)   Tighten muscles on front of right thigh, then lift leg ____ inches from surface, keeping knee locked.  Repeat ____ times per set. Do ____ sets per session. Do ____ sessions per day.  http://orth.exer.us/624   Copyright  VHI. All rights reserved.  Strengthening: Hip Adduction (Side-Lying)   Tighten muscles on front of right thigh, then lift leg ____ inches from surface, keeping knee locked.  Repeat ____ times per set. Do ____ sets per  session. Do ____ sessions per day.  http://orth.exer.us/624   Copyright  VHI. All rights reserved.

## 2015-04-07 NOTE — Therapy (Addendum)
[facility] [address] [place], [place], [address] Phone: [phone]   Fax:  [phone]  Physical Therapy Evaluation  Patient Details  Name: Jordan Bailey MRN: [id number] Date of Birth: 01/15/1966 Referring Provider:  Jodi GeraldsGraves, John, MD  Encounter Date: 04/07/2015      PT End of Session - 04/14/15 1104    Visit Number 2   Number of Visits 24   Date for PT Re-Evaluation 04/07/15   PT Start Time 1104      Past Medical History  Diagnosis Date  . Kidney stones   . Arthritis   . Iron deficiency anemia     Past Surgical History  Procedure Laterality Date  . 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 Reattached tendon Left 05/2011    LEFT LEG  . Colonoscopy    . Total knee arthroplasty Left 03/06/2015    Procedure: TOTAL KNEE ARTHROPLASTY;  Surgeon: Jodi GeraldsJohn Graves, MD;  Location: [facility] OR;  Service: Orthopedics;  Laterality: Left;    There were no vitals filed for this visit.  Visit Diagnosis:  Muscle weakness-general - Plan: PT plan of care cert/re-cert  Stiffness of multiple joints - Plan: PT plan of care cert/re-cert  Abnormal posture - Plan: PT plan of care cert/re-cert  Abnormality of gait - Plan: PT plan of care cert/re-cert      Subjective Assessment - 04/14/15 1107    Subjective Pt voices frustration with his leg length difference.  Now notices "grinding" in Rt knee with stairs.  Doing 30 min cardio min a day.  Per MD, he wants to work aggressively on Lt knee extension.    Currently in Pain? No/denies            [facility] PT Assessment - 04/14/15 0001    Assessment   Medical Diagnosis Lt TKA    Onset Date/Surgical Date 03/06/15              PT Short Term Goals - 04/07/15 1310    PT SHORT TERM GOAL #1   Title I with initial HEP(05/05/15)   Time 4   Period Weeks   Status New   PT SHORT TERM GOAL #2   Title Increase Lt knee ROM to (-)5 degrees extensioin and 125 degrees  flexion(05/05/15)   Time 4   Period Weeks   Status New   PT SHORT TERM GOAL #3   Title Improve gait pattern with straight cane (05/05/15)   Time 4   Period Weeks   Status New           PT Long Term Goals - 04/07/15 1312    PT LONG TERM GOAL #1   Title I with advanced HEP to include core work(06/30/15)   Time 12   Period Weeks   Status New   PT LONG TERM GOAL #2   Title Increase Bilat knee and hip strength to $+/5   Time 12   Period Weeks   Status New   PT LONG TERM GOAL #3   Title Improved gait pattern with more upright posture and assistive device as needed(06/30/15)   Time 12   Period Weeks   Status New   PT LONG TERM GOAL #4   Title Decrease FOTO to </=35% limitation(06/30/15)   Time 12   Period Weeks   Status New            Problem List Patient Active Problem List   Diagnosis Date Noted  . Primary osteoarthritis of  left knee 03/06/2015  . Obesity 03/06/2015  . Anemia 01/12/2015  . Bariatric surgery status 01/12/2015  . Hammer toe of left foot 12/11/2014  . Osteoarthritis of left hip 12/11/2014  . Osteoarthritis of both knees 11/28/2014  . Rupture of left quadriceps muscle post repair 11/28/2014    Magon Croson Rober Minion, PT, MPH 04/14/2015, 11:13 AM  [facility] [address] [place], [place], [address] Phone: [phone]   Fax:  [phone]

## 2015-04-13 DIAGNOSIS — Z96652 Presence of left artificial knee joint: Secondary | ICD-10-CM | POA: Diagnosis not present

## 2015-04-13 DIAGNOSIS — Z471 Aftercare following joint replacement surgery: Secondary | ICD-10-CM | POA: Diagnosis not present

## 2015-04-13 DIAGNOSIS — M25562 Pain in left knee: Secondary | ICD-10-CM | POA: Diagnosis not present

## 2015-04-14 ENCOUNTER — Ambulatory Visit (INDEPENDENT_AMBULATORY_CARE_PROVIDER_SITE_OTHER): Payer: Medicare Other | Admitting: Physical Therapy

## 2015-04-14 DIAGNOSIS — M2560 Stiffness of unspecified joint, not elsewhere classified: Secondary | ICD-10-CM | POA: Diagnosis not present

## 2015-04-14 DIAGNOSIS — R269 Unspecified abnormalities of gait and mobility: Secondary | ICD-10-CM

## 2015-04-14 DIAGNOSIS — M6281 Muscle weakness (generalized): Secondary | ICD-10-CM | POA: Diagnosis present

## 2015-04-14 DIAGNOSIS — R293 Abnormal posture: Secondary | ICD-10-CM

## 2015-04-14 NOTE — Therapy (Signed)
[facility] [place] [address] [place] [address] [place], [place], [address] Phone: [phone]   Fax:  [phone]  Physical Therapy Treatment  Patient Details  Name: Jordan Bailey MRN: [id number] Date of Birth: 01-26-1966 Referring Provider:  Jodi Geralds, MD  Encounter Date: 04/14/2015      PT End of Session - 04/14/15 1104    Visit Number 2   Number of Visits 24   Date for PT Re-Evaluation 04/07/15   PT Start Time 1104   PT Stop Time 1210   PT Time Calculation (min) 66 min      Past Medical History  Diagnosis Date  . Kidney stones   . Arthritis   . Iron deficiency anemia     Past Surgical History  Procedure Laterality Date  . 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 Reattached tendon Left 05/2011    LEFT LEG  . Colonoscopy    . 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  Visit Diagnosis:  Muscle weakness-general  Stiffness of multiple joints  Abnormal posture  Abnormality of gait      Subjective Assessment - 04/14/15 1107    Subjective Pt voices frustration with his leg length difference.  Now notices "grinding" in Rt knee with stairs.  Doing 30 min cardio min a day.  Per MD, he wants to work aggressively on Lt knee extension.    Currently in Pain? No/denies            [facility] PT Assessment - 04/14/15 0001    Assessment   Medical Diagnosis Lt TKA    Onset Date/Surgical Date 03/06/15   AROM   AROM Assessment Site Knee   Left Knee Extension -14   Left Knee Flexion 118                     [facility] Adult PT Treatment/Exercise - 04/14/15 0001    Lumbar Exercises: Aerobic   Stationary Bike NuStep L3 x 6 min   Knee/Hip Exercises: Seated   Long Arc Quad AAROM;Left;3 sets;10 reps  extensor lag, able to initiate motion; poor quad  contraction   Knee/Hip Exercises: Supine   Short Arc Quad Sets AAROM;Left;1 set;5 reps   Knee/Hip Exercises: Prone   Hamstring Curl 1 set;10 reps  with 3#    Prone Knee Hang 3 minutes   Prone Knee Hang Weights (lbs) 3#   Other Prone Exercises Tried TKE x 3 reps ; only used Lt glute- no quad contraction.    Modalities   Modalities Vasopneumatic;Moist Heat   Moist Heat Therapy   Number Minutes Moist Heat 15 Minutes   Moist Heat Location Hip  Lt flexors   Vasopneumatic   Number Minutes Vasopneumatic  15 minutes   Vasopnuematic Location  Knee   Vasopneumatic Pressure Medium   Vasopneumatic Temperature  3*   Manual Therapy   Myofascial Release STW to Lt hip flexors, very tender/sensitive, instructed in self massage and use of heat                PT Education - 04/14/15 1143    Education provided Yes   Education Details HEP - instructed pt on prone knee hang/ seated heel prop. heat to Lt hip flexors and ball pressure in prone   Person(s) Educated Patient   Methods  Explanation   Comprehension Returned demonstration;Verbalized understanding          PT Short Term Goals - 04/14/15 1201    PT SHORT TERM GOAL #1   Title I with initial HEP(05/05/15)   Status On-going   PT SHORT TERM GOAL #2   Title Increase Lt knee ROM to (-)5 degrees extensioin and 125 degrees flexion(05/05/15)   Status On-going   PT SHORT TERM GOAL #3   Title Improve gait pattern with straight cane (05/05/15)   Status On-going   PT SHORT TERM GOAL #4   Title ambulate keeping the Lt LE straight and not need the cane   Status On-going   PT SHORT TERM GOAL #5   Title improve FOTO =/< 30%    Status On-going           PT Long Term Goals - 04/14/15 1202    PT LONG TERM GOAL #1   Title I with advanced HEP to include core work(06/30/15)   Status On-going   PT LONG TERM GOAL #2   Title Increase Bilat knee and hip strength to 4+/5   Status On-going   PT LONG TERM GOAL #3   Title Improved gait  pattern with more upright posture and assistive device as needed(06/30/15)   Status On-going   PT LONG TERM GOAL #4   Title Decrease FOTO to </=35% limitation(06/30/15)   PT LONG TERM GOAL #5   Status On-going   PT LONG TERM GOAL #6   Title perform lifting up to 15# waist to head level without difficulty   Status On-going               Problem List Patient Active Problem List   Diagnosis Date Noted  . Primary osteoarthritis of left knee 03/06/2015  . Obesity 03/06/2015  . Anemia 01/12/2015  . Bariatric surgery status 01/12/2015  . Hammer toe of left foot 12/11/2014  . Osteoarthritis of left hip 12/11/2014  . Osteoarthritis of both knees 11/28/2014  . Rupture of left quadriceps muscle post repair 11/28/2014    Darl PikesSusan shaver, PT 04/14/2015, 12:03 PM  [facility] [address] [place] [address] [place], [place], [address] Phone: [phone]   Fax:  [phone]

## 2015-04-16 ENCOUNTER — Encounter: Payer: Medicare Other | Admitting: Physical Therapy

## 2015-04-20 ENCOUNTER — Encounter: Payer: Medicare Other | Admitting: Rehabilitative and Restorative Service Providers"

## 2015-04-21 ENCOUNTER — Encounter: Payer: Medicare Other | Admitting: Rehabilitative and Restorative Service Providers"

## 2015-04-23 ENCOUNTER — Encounter: Payer: Self-pay | Admitting: Physical Therapy

## 2015-04-23 ENCOUNTER — Ambulatory Visit (INDEPENDENT_AMBULATORY_CARE_PROVIDER_SITE_OTHER): Payer: Medicare Other | Admitting: Physical Therapy

## 2015-04-23 DIAGNOSIS — R293 Abnormal posture: Secondary | ICD-10-CM | POA: Diagnosis not present

## 2015-04-23 DIAGNOSIS — M2560 Stiffness of unspecified joint, not elsewhere classified: Secondary | ICD-10-CM | POA: Diagnosis not present

## 2015-04-23 DIAGNOSIS — M6281 Muscle weakness (generalized): Secondary | ICD-10-CM | POA: Diagnosis present

## 2015-04-23 DIAGNOSIS — R269 Unspecified abnormalities of gait and mobility: Secondary | ICD-10-CM

## 2015-04-23 NOTE — Therapy (Signed)
[facility] [place] [address] [place] [address] [place], [place], [address] Phone: [phone]   Fax:  [phone]  Physical Therapy Treatment  Patient Details  Name: Stanislaw Acton MRN: [id number] Date of Birth: 01/31/1966 Referring Provider:  Jodi Geralds, MD  Encounter Date: 04/23/2015      PT End of Session - 04/23/15 1058    Visit Number 3   Number of Visits 24   Date for PT Re-Evaluation 05/08/15   PT Start Time 1058   PT Stop Time 1201   PT Time Calculation (min) 63 min      Past Medical History  Diagnosis Date  . Kidney stones   . Arthritis   . Iron deficiency anemia     Past Surgical History  Procedure Laterality Date  . 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 Reattached tendon Left 05/2011    LEFT LEG  . Colonoscopy    . 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  Visit Diagnosis:  Muscle weakness-general  Stiffness of multiple joints  Abnormal posture  Abnormality of gait      Subjective Assessment - 04/23/15 1059    Subjective Pt reports he really likes sitting on the floor and pointing his toes up to get a good stretch, also working on standing up straight. He really wants to get back to work.    Patient Stated Goals to gain mobility and strength in his knees to be able to do activities around home and return to work.   Currently in Pain? No/denies            [facility] PT Assessment - 04/23/15 0001    Assessment   Medical Diagnosis Lt TKA    Onset Date/Surgical Date 03/06/15   Next MD Visit 05/14/15   AROM   AROM Assessment Site Knee  Lt   Left Knee Extension -12   Left Knee Flexion 122   PROM   Left Knee Extension -6                     [facility] Adult PT Treatment/Exercise - 04/23/15 0001    Ambulation/Gait   Ambulation/Gait --  less lateral trunk motion with added lift.    Gait Comments issued more of a heel lift for the Rt LE about 1/2'    Lumbar Exercises: Aerobic   Stationary Bike Nustep L5 x 6'   Knee/Hip Exercises: Stretches   Active Hamstring Stretch Left;3 reps;30 seconds  with knee presses   Quad Stretch Left;3 reps;30 seconds  prone with strap   Knee/Hip Exercises: Prone   Hamstring Curl --  30 reps   Straight Leg Raises Strengthening;Left;3 sets;10 reps   Moist Heat Therapy   Number Minutes Moist Heat 15 Minutes   Moist Heat Location Hip  flexors Lt and quad   Manual Therapy   Manual Therapy Joint mobilization;Myofascial release   Joint Mobilization Lt knee ext mobs grade III with foot propped up on bolster   Myofascial Release STW to Lt quad and hip flexors                  PT Short Term Goals - 04/23/15 1147    PT SHORT TERM GOAL #2   Status On-going           PT  Long Term Goals - 04/23/15 1137    PT LONG TERM GOAL #1   Title I with advanced HEP to include core work(06/30/15)   Status On-going   PT LONG TERM GOAL #2   Title Increase Bilat knee and hip strength to 4+/5   Status On-going   PT LONG TERM GOAL #3   Title Improved gait pattern with more upright posture and assistive device as needed(06/30/15)   Status On-going   PT LONG TERM GOAL #4   Title Decrease FOTO to </=35% limitation(06/30/15)   Status On-going   PT LONG TERM GOAL #5   Title improve FOTO =/< 21%    Status On-going   PT LONG TERM GOAL #6   Title perform lifting up to 15# waist to head level without difficulty   Status On-going               Plan - 04/23/15 1207    Clinical Impression Statement Pt had improved knee ROM. Lt hip flexors continue to be tight however they are improving.    Rehab Potential Good   PT Frequency 2x / week   PT Duration 12 weeks   PT Treatment/Interventions ADLs/Self Care Home Management;Cryotherapy;Electrical Stimulation;Moist  Heat;Ultrasound;Gait training;Functional mobility training;Therapeutic activities;Therapeutic exercise;Balance training;Neuromuscular re-education;Patient/family education;Manual techniques;Passive range of motion;Vasopneumatic Device   PT Next Visit Plan cont STW Lt hip flexors and quad, knee ext mobs.     Consulted and Agree with Plan of Care Patient        Problem List Patient Active Problem List   Diagnosis Date Noted  . Primary osteoarthritis of left knee 03/06/2015  . Obesity 03/06/2015  . Anemia 01/12/2015  . Bariatric surgery status 01/12/2015  . Hammer toe of left foot 12/11/2014  . Osteoarthritis of left hip 12/11/2014  . Osteoarthritis of both knees 11/28/2014  . Rupture of left quadriceps muscle post repair 11/28/2014    Roderic Scarce PT  04/23/2015, 12:12 PM  [facility] [address] [place], [place], [address] Phone: [phone]   Fax:  [phone]

## 2015-04-27 ENCOUNTER — Encounter: Payer: Self-pay | Admitting: Physical Therapy

## 2015-04-28 ENCOUNTER — Ambulatory Visit (INDEPENDENT_AMBULATORY_CARE_PROVIDER_SITE_OTHER): Payer: Medicare Other | Admitting: Physical Therapy

## 2015-04-28 DIAGNOSIS — M549 Dorsalgia, unspecified: Secondary | ICD-10-CM

## 2015-04-28 DIAGNOSIS — R293 Abnormal posture: Secondary | ICD-10-CM

## 2015-04-28 DIAGNOSIS — R269 Unspecified abnormalities of gait and mobility: Secondary | ICD-10-CM | POA: Diagnosis not present

## 2015-04-28 DIAGNOSIS — M2560 Stiffness of unspecified joint, not elsewhere classified: Secondary | ICD-10-CM

## 2015-04-28 DIAGNOSIS — M6281 Muscle weakness (generalized): Secondary | ICD-10-CM

## 2015-04-28 NOTE — Therapy (Signed)
[facility] [place] [address] [place], [place], [address] Phone: [phone]   Fax:  [phone]  Physical Therapy Treatment  Patient Details  Name: Jordan Bailey MRN: [id number] Date of Birth: 11-09-1965 Referring Provider:  Monica Becton,*  Encounter Date: 04/28/2015      PT End of Session - 04/28/15 0852    Visit Number 4   Number of Visits 24   Date for PT Re-Evaluation 05/08/15   PT Start Time 0850   PT Stop Time 0944   PT Time Calculation (min) 54 min      Past Medical History  Diagnosis Date  . Kidney stones   . Arthritis   . Iron deficiency anemia     Past Surgical History  Procedure Laterality Date  . 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 Reattached tendon Left 05/2011    LEFT LEG  . Colonoscopy    . 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  Visit Diagnosis:  Muscle weakness-general  Stiffness of multiple joints  Abnormal posture  Abnormality of gait  Back pain, unspecified location      Subjective Assessment - 04/28/15 0852    Subjective Pt reports he took one of the 1/2 " heel lift out of shoe; he felt it was pushing his toes too far forward in his shoe.  He has been propping heel up on bolster and stretching back of leg for 5 min at a time.    Currently in Pain? No/denies            [facility] PT Assessment - 04/28/15 0001    Assessment   Medical Diagnosis Lt TKA    Onset Date/Surgical Date 03/06/15   Next MD Visit 05/14/15   AROM   AROM Assessment Site Knee   Right/Left Knee Left   Right Knee Extension 0   Right Knee Flexion 125   Left Knee Extension -12  after prone hang   Left Knee Flexion 124   PROM   Left Knee Extension -5  with overpressure                      [facility]  Adult PT Treatment/Exercise - 04/28/15 0001    Exercises   Exercises Knee/Hip   Lumbar Exercises: Aerobic   Stationary Bike Nustep L5 x 6'  with UE    Knee/Hip Exercises: Standing   Forward Step Up 1 set;Left;10 reps;Hand Hold: 1  3" step    Knee/Hip Exercises: Seated   Long Arc Quad AAROM;Left;10 reps;2 sets  good initiation. partial range. focus on eccentric control.    Long [employer] Limitations assist to complete extension ROM.   very challenging exercise.    Marching 1 set;10 reps   Knee/Hip Exercises: Prone   Hamstring Curl 2 sets;10 reps  3# wt at ankle   Prone Knee Hang 2 minutes 3 reps   Prone Knee Hang Weights (lbs) 3#   Modalities   Modalities Moist Heat   Moist Heat Therapy   Number Minutes Moist Heat 12 Minutes   Moist Heat Location Hip  flexors Lt and quad   Manual Therapy   Manual Therapy Joint mobilization;Myofascial release   Joint Mobilization Lt knee ext mobs grade III with foot propped up on bolster   Myofascial Release  STW to Lt quad and hip flexors                  PT Short Term Goals - 04/23/15 1147    PT SHORT TERM GOAL #2   Status On-going           PT Long Term Goals - 04/23/15 1137    PT LONG TERM GOAL #1   Title I with advanced HEP to include core work(06/30/15)   Status On-going   PT LONG TERM GOAL #2   Title Increase Bilat knee and hip strength to 4+/5   Status On-going   PT LONG TERM GOAL #3   Title Improved gait pattern with more upright posture and assistive device as needed(06/30/15)   Status On-going   PT LONG TERM GOAL #4   Title Decrease FOTO to </=35% limitation(06/30/15)   Status On-going   PT LONG TERM GOAL #5   Title improve FOTO =/< 21%    Status On-going   PT LONG TERM GOAL #6   Title perform lifting up to 15# waist to head level without difficulty   Status On-going               Plan - 04/28/15 1256    Clinical Impression Statement Pt demo slight improvement in Lt knee ROM. Pt continues to  lack good Lt quad contraction.Pt will benefit from continued PT intervention to increase function and mobility.    Pt will benefit from skilled therapeutic intervention in order to improve on the following deficits Abnormal gait;Decreased activity tolerance;Decreased range of motion;Decreased mobility;Decreased strength;Pain;Impaired flexibility   Rehab Potential Good   PT Frequency 2x / week   PT Duration 12 weeks   PT Treatment/Interventions ADLs/Self Care Home Management;Cryotherapy;Electrical Stimulation;Moist Heat;Ultrasound;Gait training;Functional mobility training;Therapeutic activities;Therapeutic exercise;Balance training;Neuromuscular re-education;Patient/family education;Manual techniques;Passive range of motion;Vasopneumatic Device   PT Next Visit Plan cont STW Lt hip flexors and quad, knee ext mobs.  May try NMES for Lt quad next visit.         Problem List Patient Active Problem List   Diagnosis Date Noted  . Primary osteoarthritis of left knee 03/06/2015  . Obesity 03/06/2015  . Anemia 01/12/2015  . Bariatric surgery status 01/12/2015  . Hammer toe of left foot 12/11/2014  . Osteoarthritis of left hip 12/11/2014  . Osteoarthritis of both knees 11/28/2014  . Rupture of left quadriceps muscle post repair 11/28/2014    Mayer Camel, PTA 04/28/2015 1:01 PM  [facility] [place] [address] [place] [address] [place], [place], [address] Phone: [phone]   Fax:  [phone]

## 2015-04-30 ENCOUNTER — Ambulatory Visit (INDEPENDENT_AMBULATORY_CARE_PROVIDER_SITE_OTHER): Payer: Medicare Other | Admitting: Physical Therapy

## 2015-04-30 DIAGNOSIS — M6281 Muscle weakness (generalized): Secondary | ICD-10-CM

## 2015-04-30 DIAGNOSIS — M549 Dorsalgia, unspecified: Secondary | ICD-10-CM

## 2015-04-30 DIAGNOSIS — M2560 Stiffness of unspecified joint, not elsewhere classified: Secondary | ICD-10-CM | POA: Diagnosis not present

## 2015-04-30 DIAGNOSIS — R293 Abnormal posture: Secondary | ICD-10-CM | POA: Diagnosis not present

## 2015-04-30 DIAGNOSIS — R269 Unspecified abnormalities of gait and mobility: Secondary | ICD-10-CM

## 2015-04-30 NOTE — Therapy (Signed)
[facility] [place] [address] [place] [address] [place], [place], [address] Phone: [phone]   Fax:  [phone]  Physical Therapy Treatment  Patient Details  Name: Jordan Bailey MRN: [id number] Date of Birth: 11/19/1965 Referring Provider:  Sanjuana Letters, MD  Encounter Date: 04/30/2015      PT End of Session - 04/30/15 1104    Visit Number 5   Number of Visits 24   Date for PT Re-Evaluation 05/08/15   PT Start Time 1100   PT Stop Time 1149   PT Time Calculation (min) 49 min      Past Medical History  Diagnosis Date  . Kidney stones   . Arthritis   . Iron deficiency anemia     Past Surgical History  Procedure Laterality Date  . 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 Reattached tendon Left 05/2011    LEFT LEG  . Colonoscopy    . 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  Visit Diagnosis:  Muscle weakness-general  Stiffness of multiple joints  Abnormal posture  Abnormality of gait  Back pain, unspecified location      Subjective Assessment - 04/30/15 1104    Subjective Pt has nothing new to report outside of persistant stiffness in knee    Currently in Pain? No/denies            [facility] PT Assessment - 04/30/15 0001    Assessment   Medical Diagnosis Lt TKA    Onset Date/Surgical Date 03/06/15   Next MD Visit 05/14/15                     [facility] Adult PT Treatment/Exercise - 04/30/15 0001    Ambulation/Gait   Ambulation/Gait Yes   Ambulation/Gait Assistance 5: Supervision   Ambulation Distance (Feet) 160 Feet   Assistive device None   Gait Pattern Decreased step length - left;Left flexed knee in stance;Decreased weight shift to left;Step-through pattern;Decreased arm swing - left   Gait Comments VC for upright  posture, increased heel strike and wt shift Lt.  Pt demo improved posture.    Knee/Hip Exercises: Aerobic   Nustep L4: 5 min    Knee/Hip Exercises: Standing   Heel Raises Both;2 sets;10 reps   Forward Step Up 2 sets;10 reps;Left   Step Down 1 set;10 reps   SLS 3 trials on LLE x 20 sec with occasional UE support.    Knee/Hip Exercises: Prone   Hamstring Curl 2 sets;10 reps  4# wt at ankle   Prone Knee Hang 4 minutes   Prone Knee Hang Weights (lbs) 4#   Other Prone Exercises Prone knee hang without weight, MHP at Lt hip - then manual Passive Lt hip ext stretch x 30 sec x 3 reps    Modalities   Modalities Moist Heat   Moist Heat Therapy   Number Minutes Moist Heat 12 Minutes   Moist Heat Location Hip  flexors Lt and quad                  PT Short Term Goals - 04/30/15 1438    PT SHORT TERM GOAL #1   Title I with initial HEP(05/05/15)   Time 4   Period Weeks   Status Achieved   PT SHORT TERM GOAL #2   Title  Increase Lt knee ROM to (-)5 degrees extensioin and 125 degrees flexion(05/05/15)   Time 4   Period Weeks   Status On-going   PT SHORT TERM GOAL #3   Title Improve gait pattern with straight cane (05/05/15)   Time 4   Period Weeks   Status On-going   PT SHORT TERM GOAL #4   Title ambulate keeping the Lt LE straight and not need the cane   Time 4   Period Weeks   Status On-going   PT SHORT TERM GOAL #5   Title improve FOTO =/< 30%    Time 4   Period Weeks   Status On-going           PT Long Term Goals - 04/23/15 1137    PT LONG TERM GOAL #1   Title I with advanced HEP to include core work(06/30/15)   Status On-going   PT LONG TERM GOAL #2   Title Increase Bilat knee and hip strength to 4+/5   Status On-going   PT LONG TERM GOAL #3   Title Improved gait pattern with more upright posture and assistive device as needed(06/30/15)   Status On-going   PT LONG TERM GOAL #4   Title Decrease FOTO to </=35% limitation(06/30/15)   Status On-going   PT  LONG TERM GOAL #5   Title improve FOTO =/< 21%    Status On-going   PT LONG TERM GOAL #6   Title perform lifting up to 15# waist to head level without difficulty   Status On-going               Plan - 04/30/15 1434    Clinical Impression Statement Pt demo improved gait quality after prolonged Lt hip/ knee prone stretch. Pt demonstrating progress towards strength and gait goal.    Pt will benefit from skilled therapeutic intervention in order to improve on the following deficits Abnormal gait;Decreased activity tolerance;Decreased range of motion;Decreased mobility;Decreased strength;Pain;Impaired flexibility   Rehab Potential Good   PT Frequency 2x / week   PT Duration 12 weeks   PT Treatment/Interventions ADLs/Self Care Home Management;Cryotherapy;Electrical Stimulation;Moist Heat;Ultrasound;Gait training;Functional mobility training;Therapeutic activities;Therapeutic exercise;Balance training;Neuromuscular re-education;Patient/family education;Manual techniques;Passive range of motion;Vasopneumatic Device   PT Next Visit Plan cont STW Lt hip flexors and quad, knee ext mobs.  May try NMES for Lt quad next visit.    Consulted and Agree with Plan of Care Patient        Problem List Patient Active Problem List   Diagnosis Date Noted  . Primary osteoarthritis of left knee 03/06/2015  . Obesity 03/06/2015  . Anemia 01/12/2015  . Bariatric surgery status 01/12/2015  . Hammer toe of left foot 12/11/2014  . Osteoarthritis of left hip 12/11/2014  . Osteoarthritis of both knees 11/28/2014  . Rupture of left quadriceps muscle post repair 11/28/2014    Mayer Camel, PTA 04/30/2015 2:40 PM  [facility] [place] [address] [place] [address] [place], [place], [address] Phone: [phone]   Fax:  [phone]

## 2015-05-04 ENCOUNTER — Encounter: Payer: Medicare Other | Admitting: Physical Therapy

## 2015-05-06 ENCOUNTER — Telehealth: Payer: Self-pay | Admitting: *Deleted

## 2015-05-06 NOTE — Telephone Encounter (Signed)
Pt called and requests pain medication for his right knee. He has had his left knee replaced and he is going to have the right knee replaced. He wants medication for the right knee until he has his surgery. Called pt and advised that he should ask his orthopedic surgeon about his. He has an appt on Monday to f/u with them. I advised if this physician felt he needed to see his PCP for this then I advised he should schedule an appt with Dr. Karie Schwalbe who has seen and evaluated him for his knee pain. Pt voiced understanding. Pt also had questions in re to his hammer toes. He states he feels they are getting worse. He has multiple concerns. He states he has seen podiatry before. i advised that this would best be addressed in an office visit with podiatry and that Dr. Karie Schwalbe was who he spoke with back in March.

## 2015-05-07 ENCOUNTER — Ambulatory Visit (INDEPENDENT_AMBULATORY_CARE_PROVIDER_SITE_OTHER): Payer: Medicare Other | Admitting: Physical Therapy

## 2015-05-07 DIAGNOSIS — R269 Unspecified abnormalities of gait and mobility: Secondary | ICD-10-CM

## 2015-05-07 DIAGNOSIS — M2560 Stiffness of unspecified joint, not elsewhere classified: Secondary | ICD-10-CM

## 2015-05-07 DIAGNOSIS — M6281 Muscle weakness (generalized): Secondary | ICD-10-CM

## 2015-05-07 DIAGNOSIS — R293 Abnormal posture: Secondary | ICD-10-CM

## 2015-05-07 NOTE — Therapy (Addendum)
[facility] [address], [place], [address] Phone: [phone]   Fax:  [phone]  Physical Therapy Treatment  Patient Details  Name: Jordan Bailey MRN: [id number] Date of Birth: 05/08/1966 Referring Provider:  Dorna Leitz, MD  Encounter Date: 05/07/2015      PT End of Session - 05/07/15 1104    Visit Number 6   Number of Visits 24   Date for PT Re-Evaluation 05/08/15   PT Start Time 1058   PT Stop Time 1158   PT Time Calculation (min) 60 min   Activity Tolerance Patient tolerated treatment well      Past Medical History  Diagnosis Date  . Kidney stones   . Arthritis   . Iron deficiency anemia     Past Surgical History  Procedure Laterality Date  . 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 Reattached tendon Left 05/2011    LEFT LEG  . Colonoscopy    . Total knee arthroplasty Left 03/06/2015    Procedure: TOTAL KNEE ARTHROPLASTY;  Surgeon: Dorna Leitz, MD;  Location: [facility];  Service: Orthopedics;  Laterality: Left;    There were no vitals filed for this visit.  Visit Diagnosis:  Muscle weakness-general  Stiffness of multiple joints  Abnormal posture  Abnormality of gait          [facility] PT Assessment - 05/07/15 0001    Assessment   Medical Diagnosis Lt TKA    Onset Date/Surgical Date 03/06/15   Next MD Visit 05/11/15   ROM / Strength   AROM / PROM / Strength AROM;Strength   AROM   AROM Assessment Site Knee   Right/Left Knee Left   Left Knee Extension -12   Left Knee Flexion 125   PROM   Left Knee Extension -6  with overpressure   Strength   Strength Assessment Site Knee;Hip   Right/Left Hip Left   Left Hip Flexion 4+/5  supine, knee bent   Left Hip Extension 3/5   Left Hip ABduction 4/5   Right/Left Knee Left   Left Knee Flexion 4+/5   Left Knee Extension 2+/5                     [facility] Adult PT Treatment/Exercise - 05/07/15 0001    Exercises    Exercises Knee/Hip;Lumbar   Lumbar Exercises: Aerobic   Stationary Bike Nustep L5 x 6'  with UE    Lumbar Exercises: Machines for Strengthening   Leg Press RLE only - 4 plates x 8 reps (too easy), 5 plates x 10 reps x 2 sets   Lumbar Exercises: Standing   Heel Raises 20 reps   Other Standing Lumbar Exercises hip/lumbar ext stretch x 30 sec, x 3 reps    Knee/Hip Exercises: Seated   Long Arc Quad AROM;Left;10 reps;3 sets  2 sets with NMES to quad   Knee/Hip Exercises: Supine   Straight Leg Raises AAROM;Left;1 set;5 reps  with NMES   Knee/Hip Exercises: Prone   Hamstring Curl 15 reps   Prone Knee Hang 4 minutes  pillow proximal to knee, MHP at hip    Other Prone Exercises Prone knee hang without weight, MHP at Lt hip - then manual Passive Lt hip ext stretch x 30 sec x 3 reps    Modalities   Modalities Moist Heat;Electrical Stimulation   Moist Heat Therapy   Number Minutes Moist Heat 12 Minutes   Moist Heat Location Hip   Electrical Stimulation  Electrical Stimulation Location L quad    Electrical Stimulation Action NMES    Electrical Stimulation Parameters 5 sec hold/5 sec rest.   up to 43 mA, with LAQ x 20 reps, SLR assisted x 5   Electrical Stimulation Goals Tone;Neuromuscular facilitation                  PT Short Term Goals - 04/30/15 1438    PT SHORT TERM GOAL #1   Title I with initial HEP(05/05/15)   Time 4   Period Weeks   Status Achieved   PT SHORT TERM GOAL #2   Title Increase Lt knee ROM to (-)5 degrees extensioin and 125 degrees flexion(05/05/15)   Time 4   Period Weeks   Status On-going   PT SHORT TERM GOAL #3   Title Improve gait pattern with straight cane (05/05/15)   Time 4   Period Weeks   Status On-going   PT SHORT TERM GOAL #4   Title ambulate keeping the Lt LE straight and not need the cane   Time 4   Period Weeks   Status On-going   PT SHORT TERM GOAL #5   Title improve FOTO =/< 30%    Time 4   Period Weeks   Status On-going            PT Long Term Goals - 04/23/15 1137    PT LONG TERM GOAL #1   Title I with advanced HEP to include core work(06/30/15)   Status On-going   PT LONG TERM GOAL #2   Title Increase Bilat knee and hip strength to 4+/5   Status On-going   PT LONG TERM GOAL #3   Title Improved gait pattern with more upright posture and assistive device as needed(06/30/15)   Status On-going   PT LONG TERM GOAL #4   Title Decrease FOTO to </=35% limitation(06/30/15)   Status On-going   PT LONG TERM GOAL #5   Title improve FOTO =/< 21%    Status On-going   PT LONG TERM GOAL #6   Title perform lifting up to 15# waist to head level without difficulty   Status On-going               Plan - 05/07/15 1325    Clinical Impression Statement Pt demo improved  Lt quad contraction with LAQ after use of NMES, however pt continues to lack full extension and has extensor lag. Pt cannot complete SLR. Pt demo improved gait/posture after prolonged Lt hip /knee prone stretch.  No new goals met.  Pt will benefit from continued PT intervention to maximize functional mobility and safety.    Pt will benefit from skilled therapeutic intervention in order to improve on the following deficits Abnormal gait;Decreased activity tolerance;Decreased range of motion;Decreased mobility;Decreased strength;Pain;Impaired flexibility   Rehab Potential Good   PT Frequency 2x / week   PT Duration 12 weeks   PT Treatment/Interventions ADLs/Self Care Home Management;Cryotherapy;Electrical Stimulation;Moist Heat;Ultrasound;Gait training;Functional mobility training;Therapeutic activities;Therapeutic exercise;Balance training;Neuromuscular re-education;Patient/family education;Manual techniques;Passive range of motion;Vasopneumatic Device   PT Next Visit Plan Continue NMES Lt quad, progressive strengthening/stretching to Lt hip / knee.    Consulted and Agree with Plan of Care Patient        Problem List Patient Active Problem  List   Diagnosis Date Noted  . Primary osteoarthritis of left knee 03/06/2015  . Obesity 03/06/2015  . Anemia 01/12/2015  . Bariatric surgery status 01/12/2015  . Hammer toe of left foot 12/11/2014  .  Osteoarthritis of left hip 12/11/2014  . Osteoarthritis of both knees 11/28/2014  . Rupture of left quadriceps muscle post repair 11/28/2014    Kerin Perna, PTA 05/07/2015 1:31 PM  [facility] [place] [address] [place], [place], [address] Phone: [phone]   Fax:  [phone]     PHYSICAL THERAPY DISCHARGE SUMMARY  Visits from Start of Care: 6  Current functional level related to goals / functional outcomes: Progressing with ROM and strength following TKA. Working on gait and functional activity level   Remaining deficits: Continued limitations - however patient elected to continue HEP independently   Education / Equipment: HEP  Plan: Patient agrees to discharge.  Patient goals were partially met. Patient is being discharged due to not returning since the last visit.  ?????    Celyn P. Helene Kelp PT, MPH 05/29/2015 1:02 PM

## 2015-05-11 ENCOUNTER — Encounter: Payer: Medicare Other | Admitting: Rehabilitative and Restorative Service Providers"

## 2015-05-11 DIAGNOSIS — Z9889 Other specified postprocedural states: Secondary | ICD-10-CM | POA: Diagnosis not present

## 2015-05-11 DIAGNOSIS — Z96652 Presence of left artificial knee joint: Secondary | ICD-10-CM | POA: Diagnosis not present

## 2015-05-11 DIAGNOSIS — M1711 Unilateral primary osteoarthritis, right knee: Secondary | ICD-10-CM | POA: Diagnosis not present

## 2015-05-14 ENCOUNTER — Encounter: Payer: Medicare Other | Admitting: Physical Therapy

## 2015-05-18 ENCOUNTER — Other Ambulatory Visit: Payer: Self-pay | Admitting: Orthopedic Surgery

## 2015-06-01 ENCOUNTER — Other Ambulatory Visit (HOSPITAL_COMMUNITY): Payer: Medicare Other

## 2015-06-01 ENCOUNTER — Inpatient Hospital Stay (HOSPITAL_COMMUNITY)
Admission: RE | Admit: 2015-06-01 | Discharge: 2015-06-01 | Disposition: A | Discharge status: Home / Self Care | Payer: Medicare Other | Source: Ambulatory Visit

## 2015-06-01 NOTE — Pre-Procedure Instructions (Signed)
Jordan Bailey  06/01/2015      [facility] - [place], [place] - [address] [place] [address] Phone: [phone] Fax: [phone]  [facility] [phone] - [place], [place] - [facility] [address] [place] [address] Phone: [phone] Fax: [phone]  [facility] [phone] - [place], [place] - [address] [place] [address] Phone: [phone] Fax: [phone]    Your procedure is scheduled on 06/22/15. Report to short stay admitting at 1030 am  Call this number if you have problems the morning of surgery:  [phone]   Remember:  Do not eat food or drink liquids after midnight.  Take these medicines the morning of surgery with A SIP OF WATER pain med if needed  STOP all herbel meds, nsaids (aleve,naproxen,advil,ibuprofen) 5 days prior to surgery starting 06/17/15 including vitamins, aspirin   Do not wear jewelry, make-up or nail polish.  Do not wear lotions, powders, or perfumes.  You may wear deodorant.  Do not shave 48 hours prior to surgery.  Men may shave face and neck.  Do not bring valuables to the hospital.  [facility] is not responsible for any belongings or valuables.  Contacts, dentures or bridgework may not be worn into surgery.  Leave your suitcase in the car.  After surgery it may be brought to your room.  For patients admitted to the hospital, discharge time will be determined by your treatment team.  Patients discharged the day of surgery will not be allowed to drive home.   Name and phone number of your driver:    Special instructions:   Special Instructions: [facility] - Preparing for Surgery  Before surgery, you can play an important role.  Because skin is not sterile, your skin needs to be as free of germs as possible.  You can reduce the number of germs on you skin by washing with CHG (chlorahexidine  gluconate) soap before surgery.  CHG is an antiseptic cleaner which kills germs and bonds with the skin to continue killing germs even after washing.  Please DO NOT use if you have an allergy to CHG or antibacterial soaps.  If your skin becomes reddened/irritated stop using the CHG and inform your nurse when you arrive at Short Stay.  Do not shave (including legs and underarms) for at least 48 hours prior to the first CHG shower.  You may shave your face.  Please follow these instructions carefully:   1.  Shower with CHG Soap the night before surgery and the morning of Surgery.  2.  If you choose to wash your hair, wash your hair first as usual with your normal shampoo.  3.  After you shampoo, rinse your hair and body thoroughly to remove the Shampoo.  4.  Use CHG as you would any other liquid soap.  You can apply chg directly  to the skin and wash gently with scrungie or a clean washcloth.  5.  Apply the CHG Soap to your body ONLY FROM THE NECK DOWN.  Do not use on open wounds or open sores.  Avoid contact with your eyes ears, mouth and genitals (private parts).  Wash genitals (private parts)       with your normal soap.  6.  Wash thoroughly, paying special attention to the area where your surgery will be performed.  7.  Thoroughly rinse your body  with warm water from the neck down.  8.  DO NOT shower/wash with your normal soap after using and rinsing off the CHG Soap.  9.  Pat yourself dry with a clean towel.            10.  Wear clean pajamas.            11.  Place clean sheets on your bed the night of your first shower and do not sleep with pets.  Day of Surgery  Do not apply any lotions/deodorants the morning of surgery.  Please wear clean clothes to the hospital/surgery center.  Please read over the following fact sheets that you were given. Pain Booklet, Coughing and Deep Breathing, Blood Transfusion Information, Total Joint Packet, MRSA Information and Surgical Site Infection  Prevention

## 2015-06-11 ENCOUNTER — Encounter (HOSPITAL_COMMUNITY): Payer: Self-pay

## 2015-06-11 ENCOUNTER — Encounter (HOSPITAL_COMMUNITY)
Admission: RE | Admit: 2015-06-11 | Discharge: 2015-06-11 | Disposition: A | Discharge status: Home / Self Care | Payer: Medicare Other | Source: Ambulatory Visit | Attending: Orthopedic Surgery | Admitting: Orthopedic Surgery

## 2015-06-11 DIAGNOSIS — M179 Osteoarthritis of knee, unspecified: Secondary | ICD-10-CM | POA: Insufficient documentation

## 2015-06-11 DIAGNOSIS — Z0183 Encounter for blood typing: Secondary | ICD-10-CM | POA: Insufficient documentation

## 2015-06-11 DIAGNOSIS — Z01812 Encounter for preprocedural laboratory examination: Secondary | ICD-10-CM | POA: Insufficient documentation

## 2015-06-11 LAB — CBC WITH DIFFERENTIAL/PLATELET
BASOS PCT: 0 % (ref 0–1)
Basophils Absolute: 0 10*3/uL (ref 0.0–0.1)
EOS ABS: 0 10*3/uL (ref 0.0–0.7)
EOS PCT: 1 % (ref 0–5)
HCT: 37.7 % — ABNORMAL LOW (ref 39.0–52.0)
HEMOGLOBIN: 12.2 g/dL — AB (ref 13.0–17.0)
Lymphocytes Relative: 53 % — ABNORMAL HIGH (ref 12–46)
Lymphs Abs: 1.8 10*3/uL (ref 0.7–4.0)
MCH: 24.2 pg — ABNORMAL LOW (ref 26.0–34.0)
MCHC: 32.4 g/dL (ref 30.0–36.0)
MCV: 74.7 fL — ABNORMAL LOW (ref 78.0–100.0)
MONOS PCT: 11 % (ref 3–12)
Monocytes Absolute: 0.4 10*3/uL (ref 0.1–1.0)
NEUTROS PCT: 35 % — AB (ref 43–77)
Neutro Abs: 1.2 10*3/uL — ABNORMAL LOW (ref 1.7–7.7)
PLATELETS: 207 10*3/uL (ref 150–400)
RBC: 5.05 MIL/uL (ref 4.22–5.81)
RDW: 15.5 % (ref 11.5–15.5)
WBC: 3.4 10*3/uL — AB (ref 4.0–10.5)

## 2015-06-11 LAB — URINALYSIS, ROUTINE W REFLEX MICROSCOPIC
BILIRUBIN URINE: NEGATIVE
GLUCOSE, UA: NEGATIVE mg/dL
HGB URINE DIPSTICK: NEGATIVE
KETONES UR: NEGATIVE mg/dL
Leukocytes, UA: NEGATIVE
Nitrite: NEGATIVE
PROTEIN: NEGATIVE mg/dL
Specific Gravity, Urine: 1.022 (ref 1.005–1.030)
UROBILINOGEN UA: 1 mg/dL (ref 0.0–1.0)
pH: 6 (ref 5.0–8.0)

## 2015-06-11 LAB — SURGICAL PCR SCREEN
MRSA, PCR: NEGATIVE
STAPHYLOCOCCUS AUREUS: NEGATIVE

## 2015-06-11 LAB — TYPE AND SCREEN
ABO/RH(D): O POS
ANTIBODY SCREEN: NEGATIVE

## 2015-06-11 LAB — IRON AND TIBC
Iron: 45 ug/dL (ref 45–182)
Saturation Ratios: 15 % — ABNORMAL LOW (ref 17.9–39.5)
TIBC: 293 ug/dL (ref 250–450)
UIBC: 248 ug/dL

## 2015-06-11 LAB — COMPREHENSIVE METABOLIC PANEL
ALBUMIN: 3.6 g/dL (ref 3.5–5.0)
ALK PHOS: 75 U/L (ref 38–126)
ALT: 11 U/L — ABNORMAL LOW (ref 17–63)
ANION GAP: 5 (ref 5–15)
AST: 21 U/L (ref 15–41)
BUN: 9 mg/dL (ref 6–20)
CALCIUM: 9.1 mg/dL (ref 8.9–10.3)
CHLORIDE: 108 mmol/L (ref 101–111)
CO2: 25 mmol/L (ref 22–32)
Creatinine, Ser: 0.77 mg/dL (ref 0.61–1.24)
GFR calc non Af Amer: >60 mL/min (ref >60)
GLUCOSE: 70 mg/dL (ref 65–99)
Potassium: 3.8 mmol/L (ref 3.5–5.1)
SODIUM: 138 mmol/L (ref 135–145)
Total Bilirubin: 0.6 mg/dL (ref 0.3–1.2)
Total Protein: 6.9 g/dL (ref 6.5–8.1)

## 2015-06-11 LAB — APTT: aPTT: 33 seconds (ref 24–37)

## 2015-06-11 LAB — PROTIME-INR
INR: 1.12 (ref 0.00–1.49)
Prothrombin Time: 14.6 seconds (ref 11.6–15.2)

## 2015-06-11 NOTE — Progress Notes (Signed)
Denies any cardiac issues.  Has never seen a cardiologist. Denies being a diabetic, nor does he take [place],

## 2015-06-12 ENCOUNTER — Other Ambulatory Visit (HOSPITAL_COMMUNITY): Payer: Medicare Other

## 2015-06-22 ENCOUNTER — Inpatient Hospital Stay (HOSPITAL_COMMUNITY)
Admission: RE | Admit: 2015-06-22 | Discharge: 2015-06-23 | DRG: 470 | Disposition: A | Discharge status: Home Health | Payer: Medicare Other | Source: Ambulatory Visit | Attending: Orthopedic Surgery | Admitting: Orthopedic Surgery

## 2015-06-22 ENCOUNTER — Inpatient Hospital Stay (HOSPITAL_COMMUNITY): Payer: Medicare Other | Admitting: Certified Registered"

## 2015-06-22 ENCOUNTER — Encounter (HOSPITAL_COMMUNITY)
Admission: RE | Disposition: A | Discharge status: Home Health | Payer: Self-pay | Source: Ambulatory Visit | Attending: Orthopedic Surgery

## 2015-06-22 DIAGNOSIS — F17290 Nicotine dependence, other tobacco product, uncomplicated: Secondary | ICD-10-CM | POA: Diagnosis not present

## 2015-06-22 DIAGNOSIS — M179 Osteoarthritis of knee, unspecified: Secondary | ICD-10-CM | POA: Diagnosis not present

## 2015-06-22 DIAGNOSIS — G8918 Other acute postprocedural pain: Secondary | ICD-10-CM | POA: Diagnosis not present

## 2015-06-22 DIAGNOSIS — Z79899 Other long term (current) drug therapy: Secondary | ICD-10-CM

## 2015-06-22 DIAGNOSIS — M1711 Unilateral primary osteoarthritis, right knee: Principal | ICD-10-CM | POA: Diagnosis present

## 2015-06-22 DIAGNOSIS — Z9884 Bariatric surgery status: Secondary | ICD-10-CM | POA: Diagnosis not present

## 2015-06-22 DIAGNOSIS — E669 Obesity, unspecified: Secondary | ICD-10-CM | POA: Diagnosis present

## 2015-06-22 DIAGNOSIS — M25561 Pain in right knee: Secondary | ICD-10-CM | POA: Diagnosis not present

## 2015-06-22 DIAGNOSIS — Z833 Family history of diabetes mellitus: Secondary | ICD-10-CM | POA: Diagnosis not present

## 2015-06-22 DIAGNOSIS — Z7901 Long term (current) use of anticoagulants: Secondary | ICD-10-CM | POA: Diagnosis not present

## 2015-06-22 DIAGNOSIS — Z96652 Presence of left artificial knee joint: Secondary | ICD-10-CM | POA: Diagnosis not present

## 2015-06-22 HISTORY — PX: TOTAL KNEE ARTHROPLASTY: SHX125

## 2015-06-22 SURGERY — ARTHROPLASTY, KNEE, TOTAL
Anesthesia: General | Site: Knee | Laterality: Right

## 2015-06-22 MED ORDER — FENTANYL CITRATE (PF) 100 MCG/2ML IJ SOLN
100.0000 ug | Freq: Once | INTRAMUSCULAR | Status: AC
  Administered 2015-06-22: 100 ug via INTRAVENOUS

## 2015-06-22 MED ORDER — HYDROMORPHONE HCL 1 MG/ML IJ SOLN: 1.0000 mg | INTRAMUSCULAR | Status: DC | PRN

## 2015-06-22 MED ORDER — PROMETHAZINE HCL 25 MG/ML IJ SOLN: 12.5000 mg | Freq: Four times a day (QID) | INTRAMUSCULAR | Status: DC | PRN

## 2015-06-22 MED ORDER — METHOCARBAMOL 750 MG PO TABS: 750.0000 mg | ORAL_TABLET | Freq: Three times a day (TID) | ORAL | Status: DC | PRN

## 2015-06-22 MED ORDER — KETOROLAC TROMETHAMINE 30 MG/ML IJ SOLN
INTRAMUSCULAR | Status: DC | PRN
  Administered 2015-06-22: 30 mg via INTRAVENOUS

## 2015-06-22 MED ORDER — CHLORHEXIDINE GLUCONATE 4 % EX LIQD: 60.0000 mL | Freq: Once | CUTANEOUS | Status: DC

## 2015-06-22 MED ORDER — 0.9 % SODIUM CHLORIDE (POUR BTL) OPTIME
TOPICAL | Status: DC | PRN
  Administered 2015-06-22: 1000 mL

## 2015-06-22 MED ORDER — FENTANYL CITRATE (PF) 250 MCG/5ML IJ SOLN
INTRAMUSCULAR | Status: AC
  Filled 2015-06-22: qty 5

## 2015-06-22 MED ORDER — PROMETHAZINE HCL 25 MG/ML IJ SOLN
6.2500 mg | INTRAMUSCULAR | Status: DC | PRN
Start: 2015-06-22 — End: 2015-06-22

## 2015-06-22 MED ORDER — BUPIVACAINE HCL (PF) 0.5 % IJ SOLN
INTRAMUSCULAR | Status: AC
  Filled 2015-06-22: qty 30

## 2015-06-22 MED ORDER — DEXAMETHASONE SODIUM PHOSPHATE 4 MG/ML IJ SOLN
INTRAMUSCULAR | Status: DC | PRN
  Administered 2015-06-22: 8 mg via INTRAVENOUS

## 2015-06-22 MED ORDER — BUPIVACAINE HCL (PF) 0.5 % IJ SOLN
INTRAMUSCULAR | Status: DC | PRN
  Administered 2015-06-22: 30 mL

## 2015-06-22 MED ORDER — OXYCODONE-ACETAMINOPHEN 5-325 MG PO TABS
1.0000 | ORAL_TABLET | ORAL | Status: DC | PRN
  Administered 2015-06-22 – 2015-06-23 (×3): 2 via ORAL
  Filled 2015-06-22 (×3): qty 2

## 2015-06-22 MED ORDER — ZOLPIDEM TARTRATE 5 MG PO TABS: 5.0000 mg | ORAL_TABLET | Freq: Every evening | ORAL | Status: DC | PRN

## 2015-06-22 MED ORDER — RIVAROXABAN 10 MG PO TABS
10.0000 mg | ORAL_TABLET | Freq: Every day | ORAL | Status: DC
Start: 2015-06-23 — End: 2015-06-23
  Administered 2015-06-23: 10 mg via ORAL
  Filled 2015-06-22: qty 1

## 2015-06-22 MED ORDER — OXYCODONE-ACETAMINOPHEN 5-325 MG PO TABS: 1.0000 | ORAL_TABLET | ORAL | Status: DC | PRN

## 2015-06-22 MED ORDER — TRANEXAMIC ACID 1000 MG/10ML IV SOLN
2000.0000 mg | Freq: Once | INTRAVENOUS | Status: AC
  Administered 2015-06-22: 2000 mg via TOPICAL
  Filled 2015-06-22: qty 20

## 2015-06-22 MED ORDER — METHOCARBAMOL 500 MG PO TABS
500.0000 mg | ORAL_TABLET | Freq: Four times a day (QID) | ORAL | Status: DC | PRN
  Administered 2015-06-22 – 2015-06-23 (×2): 500 mg via ORAL
  Filled 2015-06-22 (×2): qty 1

## 2015-06-22 MED ORDER — PHENYLEPHRINE HCL 10 MG/ML IJ SOLN
INTRAMUSCULAR | Status: DC | PRN
  Administered 2015-06-22 (×2): 80 ug via INTRAVENOUS

## 2015-06-22 MED ORDER — PROPOFOL 10 MG/ML IV BOLUS
INTRAVENOUS | Status: DC | PRN
  Administered 2015-06-22: 200 mg via INTRAVENOUS

## 2015-06-22 MED ORDER — FERROUS SULFATE 325 (65 FE) MG PO TBEC: 325.0000 mg | DELAYED_RELEASE_TABLET | Freq: Three times a day (TID) | ORAL | Status: DC

## 2015-06-22 MED ORDER — CEFAZOLIN SODIUM-DEXTROSE 2-3 GM-% IV SOLR
2.0000 g | Freq: Four times a day (QID) | INTRAVENOUS | Status: AC
  Administered 2015-06-22 – 2015-06-23 (×2): 2 g via INTRAVENOUS
  Filled 2015-06-22 (×2): qty 50

## 2015-06-22 MED ORDER — ONDANSETRON HCL 4 MG/2ML IJ SOLN
INTRAMUSCULAR | Status: DC | PRN
  Administered 2015-06-22: 12 mg via INTRAVENOUS

## 2015-06-22 MED ORDER — SODIUM CHLORIDE 0.9 % IV SOLN
INTRAVENOUS | Status: DC
  Administered 2015-06-22: 19:00:00 via INTRAVENOUS

## 2015-06-22 MED ORDER — MIDAZOLAM HCL 5 MG/ML IJ SOLN: 2.0000 mg | Freq: Once | INTRAMUSCULAR | Status: DC

## 2015-06-22 MED ORDER — HYDROMORPHONE HCL 1 MG/ML IJ SOLN
INTRAMUSCULAR | Status: DC | PRN
  Administered 2015-06-22 (×2): .4 mg via INTRAVENOUS
  Administered 2015-06-22: .2 mg via INTRAVENOUS
  Administered 2015-06-22: .6 mg via INTRAVENOUS
  Administered 2015-06-22: .4 mg via INTRAVENOUS

## 2015-06-22 MED ORDER — FENTANYL CITRATE (PF) 100 MCG/2ML IJ SOLN
INTRAMUSCULAR | Status: AC
  Administered 2015-06-22: 100 ug via INTRAVENOUS
  Filled 2015-06-22: qty 2

## 2015-06-22 MED ORDER — PROPOFOL 10 MG/ML IV BOLUS
INTRAVENOUS | Status: AC
  Filled 2015-06-22: qty 20

## 2015-06-22 MED ORDER — LACTATED RINGERS IV SOLN
INTRAVENOUS | Status: DC | PRN
  Administered 2015-06-22: 12:00:00 via INTRAVENOUS

## 2015-06-22 MED ORDER — ONDANSETRON HCL 4 MG/2ML IJ SOLN: 4.0000 mg | Freq: Four times a day (QID) | INTRAMUSCULAR | Status: DC | PRN

## 2015-06-22 MED ORDER — FUROSEMIDE 20 MG PO TABS
10.0000 mg | ORAL_TABLET | Freq: Every day | ORAL | Status: DC
  Filled 2015-06-22 (×2): qty 1

## 2015-06-22 MED ORDER — ACETAMINOPHEN 650 MG RE SUPP
650.0000 mg | Freq: Four times a day (QID) | RECTAL | Status: DC | PRN
Start: 2015-06-22 — End: 2015-06-23

## 2015-06-22 MED ORDER — BUPIVACAINE-EPINEPHRINE (PF) 0.5% -1:200000 IJ SOLN
INTRAMUSCULAR | Status: DC | PRN
  Administered 2015-06-22: 30 mL via PERINEURAL

## 2015-06-22 MED ORDER — MIDAZOLAM HCL 2 MG/2ML IJ SOLN
INTRAMUSCULAR | Status: AC
  Administered 2015-06-22: 2 mg
  Filled 2015-06-22: qty 2

## 2015-06-22 MED ORDER — MIDAZOLAM HCL 2 MG/2ML IJ SOLN
INTRAMUSCULAR | Status: AC
  Filled 2015-06-22: qty 4

## 2015-06-22 MED ORDER — MAGNESIUM CITRATE PO SOLN: 1.0000 | Freq: Once | ORAL | Status: DC | PRN

## 2015-06-22 MED ORDER — MIDAZOLAM HCL 2 MG/2ML IJ SOLN: 0.5000 mg | Freq: Once | INTRAMUSCULAR | Status: DC | PRN

## 2015-06-22 MED ORDER — BISACODYL 5 MG PO TBEC: 5.0000 mg | DELAYED_RELEASE_TABLET | Freq: Every day | ORAL | Status: DC | PRN

## 2015-06-22 MED ORDER — KETOROLAC TROMETHAMINE 15 MG/ML IJ SOLN
15.0000 mg | Freq: Three times a day (TID) | INTRAMUSCULAR | Status: DC
  Administered 2015-06-22 – 2015-06-23 (×3): 15 mg via INTRAVENOUS
  Filled 2015-06-22 (×3): qty 1

## 2015-06-22 MED ORDER — ALUM & MAG HYDROXIDE-SIMETH 200-200-20 MG/5ML PO SUSP: 30.0000 mL | ORAL | Status: DC | PRN

## 2015-06-22 MED ORDER — POLYETHYLENE GLYCOL 3350 17 G PO PACK: 17.0000 g | PACK | Freq: Every day | ORAL | Status: DC | PRN

## 2015-06-22 MED ORDER — CEFAZOLIN SODIUM-DEXTROSE 2-3 GM-% IV SOLR
2.0000 g | INTRAVENOUS | Status: AC
  Administered 2015-06-22: 2 g via INTRAVENOUS
  Filled 2015-06-22: qty 50

## 2015-06-22 MED ORDER — METHOCARBAMOL 1000 MG/10ML IJ SOLN
500.0000 mg | Freq: Four times a day (QID) | INTRAVENOUS | Status: DC | PRN
  Filled 2015-06-22: qty 5

## 2015-06-22 MED ORDER — RIVAROXABAN 10 MG PO TABS: 10.0000 mg | ORAL_TABLET | Freq: Every day | ORAL | Status: DC

## 2015-06-22 MED ORDER — DEXAMETHASONE SODIUM PHOSPHATE 10 MG/ML IJ SOLN
10.0000 mg | Freq: Two times a day (BID) | INTRAMUSCULAR | Status: AC
  Administered 2015-06-22 – 2015-06-23 (×2): 10 mg via INTRAVENOUS
  Filled 2015-06-22 (×2): qty 1

## 2015-06-22 MED ORDER — ACETAMINOPHEN 325 MG PO TABS: 650.0000 mg | ORAL_TABLET | Freq: Four times a day (QID) | ORAL | Status: DC | PRN

## 2015-06-22 MED ORDER — MEPERIDINE HCL 25 MG/ML IJ SOLN: 6.2500 mg | INTRAMUSCULAR | Status: DC | PRN

## 2015-06-22 MED ORDER — DIPHENHYDRAMINE HCL 12.5 MG/5ML PO ELIX: 12.5000 mg | ORAL_SOLUTION | ORAL | Status: DC | PRN

## 2015-06-22 MED ORDER — FENTANYL CITRATE (PF) 100 MCG/2ML IJ SOLN
INTRAMUSCULAR | Status: DC | PRN
  Administered 2015-06-22: 100 ug via INTRAVENOUS

## 2015-06-22 MED ORDER — BUPIVACAINE LIPOSOME 1.3 % IJ SUSP
20.0000 mL | Freq: Once | INTRAMUSCULAR | Status: AC
  Administered 2015-06-22: 20 mL
  Filled 2015-06-22: qty 20

## 2015-06-22 MED ORDER — DOCUSATE SODIUM 100 MG PO CAPS
100.0000 mg | ORAL_CAPSULE | Freq: Two times a day (BID) | ORAL | Status: DC
  Administered 2015-06-23: 100 mg via ORAL
  Filled 2015-06-22 (×2): qty 1

## 2015-06-22 MED ORDER — LACTATED RINGERS IV SOLN
Freq: Once | INTRAVENOUS | Status: AC
  Administered 2015-06-22: 12:00:00 via INTRAVENOUS

## 2015-06-22 MED ORDER — ONDANSETRON HCL 4 MG PO TABS: 4.0000 mg | ORAL_TABLET | Freq: Four times a day (QID) | ORAL | Status: DC | PRN

## 2015-06-22 MED ORDER — HYDROMORPHONE HCL 1 MG/ML IJ SOLN: 0.2500 mg | INTRAMUSCULAR | Status: DC | PRN

## 2015-06-22 SURGICAL SUPPLY — 63 items
BANDAGE ELASTIC 4 VELCRO ST LF (GAUZE/BANDAGES/DRESSINGS) ×2 IMPLANT
BANDAGE ELASTIC 6 VELCRO ST LF (GAUZE/BANDAGES/DRESSINGS) ×2 IMPLANT
BANDAGE ESMARK 6X9 LF (GAUZE/BANDAGES/DRESSINGS) ×1 IMPLANT
BENZOIN TINCTURE PRP APPL 2/3 (GAUZE/BANDAGES/DRESSINGS) IMPLANT
BLADE SAGITTAL 25.0X1.19X90 (BLADE) ×2 IMPLANT
BLADE SAW SAG 90X13X1.27 (BLADE) ×2 IMPLANT
BNDG ESMARK 6X9 LF (GAUZE/BANDAGES/DRESSINGS) ×2
BOWL SMART MIX CTS (DISPOSABLE) ×2 IMPLANT
CAP KNEE TOTAL 3 SIGMA ×2 IMPLANT
CEMENT HV SMART SET (Cement) ×4 IMPLANT
COVER SURGICAL LIGHT HANDLE (MISCELLANEOUS) ×2 IMPLANT
CUFF TOURNIQUET SINGLE 34IN LL (TOURNIQUET CUFF) ×2 IMPLANT
CUFF TOURNIQUET SINGLE 44IN (TOURNIQUET CUFF) IMPLANT
DRAPE EXTREMITY T 121X128X90 (DRAPE) IMPLANT
DRAPE IMP U-DRAPE 54X76 (DRAPES) ×2 IMPLANT
DRAPE U-SHAPE 47X51 STRL (DRAPES) ×2 IMPLANT
DRSG AQUACEL AG ADV 3.5X10 (GAUZE/BANDAGES/DRESSINGS) ×2 IMPLANT
DRSG MEPILEX BORDER 4X12 (GAUZE/BANDAGES/DRESSINGS) IMPLANT
DRSG PAD ABDOMINAL 8X10 ST (GAUZE/BANDAGES/DRESSINGS) ×2 IMPLANT
DURAPREP 26ML APPLICATOR (WOUND CARE) ×4 IMPLANT
ELECT REM PT RETURN 9FT ADLT (ELECTROSURGICAL) ×2
ELECTRODE REM PT RTRN 9FT ADLT (ELECTROSURGICAL) ×1 IMPLANT
EVACUATOR 1/8 PVC DRAIN (DRAIN) IMPLANT
FACESHIELD WRAPAROUND (MASK) ×2 IMPLANT
GAUZE SPONGE 4X4 12PLY STRL (GAUZE/BANDAGES/DRESSINGS) ×2 IMPLANT
GLOVE BIOGEL PI IND STRL 8 (GLOVE) ×2 IMPLANT
GLOVE BIOGEL PI INDICATOR 8 (GLOVE) ×2
GLOVE ECLIPSE 6.5 STRL STRAW (GLOVE) ×2 IMPLANT
GLOVE ECLIPSE 7.5 STRL STRAW (GLOVE) ×4 IMPLANT
GOWN STRL REUS W/ TWL LRG LVL3 (GOWN DISPOSABLE) ×2 IMPLANT
GOWN STRL REUS W/ TWL XL LVL3 (GOWN DISPOSABLE) ×2 IMPLANT
GOWN STRL REUS W/TWL LRG LVL3 (GOWN DISPOSABLE) ×2
GOWN STRL REUS W/TWL XL LVL3 (GOWN DISPOSABLE) ×2
HANDPIECE INTERPULSE COAX TIP (DISPOSABLE) ×1
HOOD PEEL AWAY FACE SHEILD DIS (HOOD) ×4 IMPLANT
IMMOBILIZER KNEE 20 (SOFTGOODS) IMPLANT
IMMOBILIZER KNEE 22 UNIV (SOFTGOODS) ×2 IMPLANT
KIT BASIN OR (CUSTOM PROCEDURE TRAY) ×2 IMPLANT
KIT ROOM TURNOVER OR (KITS) ×2 IMPLANT
MANIFOLD NEPTUNE II (INSTRUMENTS) ×2 IMPLANT
NEEDLE SPNL 22GX3.5 QUINCKE BK (NEEDLE) ×2 IMPLANT
NS IRRIG 1000ML POUR BTL (IV SOLUTION) ×2 IMPLANT
PACK TOTAL JOINT (CUSTOM PROCEDURE TRAY) ×2 IMPLANT
PACK UNIVERSAL I (CUSTOM PROCEDURE TRAY) ×2 IMPLANT
PAD ARMBOARD 7.5X6 YLW CONV (MISCELLANEOUS) ×4 IMPLANT
PAD CAST 4YDX4 CTTN HI CHSV (CAST SUPPLIES) ×1 IMPLANT
PADDING CAST COTTON 4X4 STRL (CAST SUPPLIES) ×1
SET HNDPC FAN SPRY TIP SCT (DISPOSABLE) ×1 IMPLANT
STAPLER VISISTAT 35W (STAPLE) ×4 IMPLANT
STRIP CLOSURE SKIN 1/2X4 (GAUZE/BANDAGES/DRESSINGS) IMPLANT
SUCTION FRAZIER TIP 10 FR DISP (SUCTIONS) ×2 IMPLANT
SUT MNCRL AB 3-0 PS2 18 (SUTURE) ×2 IMPLANT
SUT VIC AB 0 CTB1 27 (SUTURE) ×4 IMPLANT
SUT VIC AB 1 CT1 27 (SUTURE) ×2
SUT VIC AB 1 CT1 27XBRD ANBCTR (SUTURE) ×2 IMPLANT
SUT VIC AB 2-0 CTB1 (SUTURE) ×4 IMPLANT
SYR 50ML LL SCALE MARK (SYRINGE) ×2 IMPLANT
TOWEL OR 17X24 6PK STRL BLUE (TOWEL DISPOSABLE) ×2 IMPLANT
TOWEL OR 17X26 10 PK STRL BLUE (TOWEL DISPOSABLE) ×2 IMPLANT
TRAY CATH 16FR W/PLASTIC CATH (SET/KITS/TRAYS/PACK) ×4 IMPLANT
TRAY FOLEY CATH 16FRSI W/METER (SET/KITS/TRAYS/PACK) IMPLANT
UPCHARGE REV TRAY MBT KNEE ×2 IMPLANT
WRAP KNEE MAXI GEL POST OP (GAUZE/BANDAGES/DRESSINGS) IMPLANT

## 2015-06-22 NOTE — H&P (Signed)
TOTAL KNEE ADMISSION H&P  Patient is being admitted for right total knee arthroplasty.  Subjective:  Chief Complaint:right knee pain.  HPI: Jordan Bailey, 49 y.o. male, has a history of pain and functional disability in the right knee due to arthritis and has failed non-surgical conservative treatments for greater than 12 weeks to includeNSAID's and/or analgesics, corticosteriod injections, viscosupplementation injections, flexibility and strengthening excercises, supervised PT with diminished ADL's post treatment, use of assistive devices, weight reduction as appropriate and activity modification.  Onset of symptoms was gradual, starting 5 years ago with gradually worsening course since that time. The patient noted no past surgery on the right knee(s).  Patient currently rates pain in the right knee(s) at 8 out of 10 with activity. Patient has night pain, worsening of pain with activity and weight bearing, pain that interferes with activities of daily living, pain with passive range of motion, crepitus and joint swelling.  Patient has evidence of subchondral cysts, subchondral sclerosis, periarticular osteophytes, joint subluxation and joint space narrowing by imaging studies. This patient has had failure of all reasonable conservative care. There is no active infection.  Patient Active Problem List   Diagnosis Date Noted  . Primary osteoarthritis of left knee 03/06/2015  . Obesity 03/06/2015  . Anemia 01/12/2015  . Bariatric surgery status 01/12/2015  . Hammer toe of left foot 12/11/2014  . Osteoarthritis of left hip 12/11/2014  . Osteoarthritis of both knees 11/28/2014  . Rupture of left quadriceps muscle post repair 11/28/2014   Past Medical History  Diagnosis Date  . Kidney stones   . Arthritis   . Iron deficiency anemia     Past Surgical History  Procedure Laterality Date  . 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 Reattached tendon Left 05/2011    LEFT LEG  .  Colonoscopy    . Total knee arthroplasty Left 03/06/2015    Procedure: TOTAL KNEE ARTHROPLASTY;  Surgeon: Dorna Leitz, MD;  Location: [facility];  Service: Orthopedics;  Laterality: Left;    Prescriptions prior to admission  Medication Sig Dispense Refill Last Dose  . docusate sodium (COLACE) 100 MG capsule Take 100 mg by mouth daily.   Past Month at Unknown time  . ferrous sulfate 325 (65 FE) MG EC tablet Take 1 tablet (325 mg total) by mouth 3 (three) times daily with meals.  3 Past Month at Unknown time  . furosemide (LASIX) 20 MG tablet Take 10 mg by mouth daily.   Past Month at Unknown time  . HYDROcodone-acetaminophen (NORCO) 10-325 MG per tablet Take 1-2 tablets by mouth every 6 (six) hours as needed.  0 Past Month at Unknown time  . Multiple Vitamins-Minerals (MULTIVITAMIN WITH MINERALS) tablet Take 1 tablet by mouth daily.   Past Month at Unknown time  . HYDROcodone-acetaminophen (NORCO/VICODIN) 5-325 MG per tablet Take 1 tablet by mouth every 6 (six) hours as needed for moderate pain.     . Iron TABS Take 4 tablets by mouth every evening.   Past Week at Unknown time  . methocarbamol (ROBAXIN-750) 750 MG tablet Take 1 tablet (750 mg total) by mouth every 8 (eight) hours as needed for muscle spasms. (Patient not taking: Reported on 06/10/2015) 60 tablet 0   . oxyCODONE-acetaminophen (PERCOCET/ROXICET) 5-325 MG per tablet Take 1-2 tablets by mouth every 4 (four) hours as needed for severe pain. (Patient not taking: Reported on 06/10/2015) 60 tablet 0   . rivaroxaban (XARELTO) 10 MG TABS tablet Take 1 tablet (10 mg total) by  mouth daily. Take until gone. (Patient not taking: Reported on 06/10/2015) 21 tablet 0    No Known Allergies  Social History  Substance Use Topics  . Smoking status: Light Tobacco Smoker -- 18 years    Types: Cigars  . Smokeless tobacco: Former Systems developer  . Alcohol Use: 0.0 oz/week    0 Standard drinks or equivalent per week     Comment: rare    Family History  Problem Relation  Age of Onset  . Diabetes Mother      ROS ROS: I have reviewed the patient's review of systems thoroughly and there are no positive responses as relates to the HPI.  Objective:  Physical Exam EXAM: Vital signs in last 24 hours: Temp:  [97.1 F (36.2 C)] 97.1 F (36.2 C) (09/19 1049) Pulse Rate:  [50] 50 (09/19 1049) Resp:  [20] 20 (09/19 1049) BP: (107)/(58) 107/58 mmHg (09/19 1049) SpO2:  [100 %] 100 % (09/19 1049) Weight:  [207 lb 6 oz (94.065 kg)] 207 lb 6 oz (94.065 kg) (09/19 1049) Well-developed well-nourished patient in no acute distress. Alert and oriented x3 HEENT:within normal limits Cardiac: Regular rate and rhythm Pulmonary: Lungs clear to auscultation Abdomen: Soft and nontender.  Normal active bowel sounds  Musculoskeletal: (Right knee: Painful range of motion.  No instability.  Limited range of motion of 5-85.  Severe varus malalignment.)  Labs: Recent Results (from the past 2160 hour(s))  Surgical pcr screen     Status: None   Collection Time: 06/11/15  9:06 AM  Result Value Ref Range   MRSA, PCR NEGATIVE NEGATIVE   Staphylococcus aureus NEGATIVE NEGATIVE    Comment:        The Xpert SA Assay (FDA approved for NASAL specimens in patients over 26 years of age), is one component of a comprehensive surveillance program.  Test performance has been validated by [facility] for patients greater than or equal to 24 year old. It is not intended to diagnose infection nor to guide or monitor treatment.   Urinalysis, Routine w reflex microscopic (not at [facility])     Status: Abnormal   Collection Time: 06/11/15  9:06 AM  Result Value Ref Range   Color, Urine AMBER (A) YELLOW    Comment: BIOCHEMICALS MAY BE AFFECTED BY COLOR   APPearance CLEAR CLEAR   Specific Gravity, Urine 1.022 1.005 - 1.030   pH 6.0 5.0 - 8.0   Glucose, UA NEGATIVE NEGATIVE mg/dL   Hgb urine dipstick NEGATIVE NEGATIVE   Bilirubin Urine NEGATIVE NEGATIVE   Ketones, ur NEGATIVE NEGATIVE  mg/dL   Protein, ur NEGATIVE NEGATIVE mg/dL   Urobilinogen, UA 1.0 0.0 - 1.0 mg/dL   Nitrite NEGATIVE NEGATIVE   Leukocytes, UA NEGATIVE NEGATIVE    Comment: MICROSCOPIC NOT DONE ON URINES WITH NEGATIVE PROTEIN, BLOOD, LEUKOCYTES, NITRITE, OR GLUCOSE <1000 mg/dL.  Iron and TIBC     Status: Abnormal   Collection Time: 06/11/15  9:07 AM  Result Value Ref Range   Iron 45 45 - 182 ug/dL   TIBC 293 250 - 450 ug/dL   Saturation Ratios 15 (L) 17.9 - 39.5 %   UIBC 248 ug/dL  APTT     Status: None   Collection Time: 06/11/15  9:07 AM  Result Value Ref Range   aPTT 33 24 - 37 seconds  CBC WITH DIFFERENTIAL     Status: Abnormal   Collection Time: 06/11/15  9:07 AM  Result Value Ref Range   WBC 3.4 (L) 4.0 -  10.5 K/uL   RBC 5.05 4.22 - 5.81 MIL/uL   Hemoglobin 12.2 (L) 13.0 - 17.0 g/dL   HCT 37.7 (L) 39.0 - 52.0 %   MCV 74.7 (L) 78.0 - 100.0 fL   MCH 24.2 (L) 26.0 - 34.0 pg   MCHC 32.4 30.0 - 36.0 g/dL   RDW 15.5 11.5 - 15.5 %   Platelets 207 150 - 400 K/uL   Neutrophils Relative % 35 (L) 43 - 77 %   Neutro Abs 1.2 (L) 1.7 - 7.7 K/uL   Lymphocytes Relative 53 (H) 12 - 46 %   Lymphs Abs 1.8 0.7 - 4.0 K/uL   Monocytes Relative 11 3 - 12 %   Monocytes Absolute 0.4 0.1 - 1.0 K/uL   Eosinophils Relative 1 0 - 5 %   Eosinophils Absolute 0.0 0.0 - 0.7 K/uL   Basophils Relative 0 0 - 1 %   Basophils Absolute 0.0 0.0 - 0.1 K/uL  Comprehensive metabolic panel     Status: Abnormal   Collection Time: 06/11/15  9:07 AM  Result Value Ref Range   Sodium 138 135 - 145 mmol/L   Potassium 3.8 3.5 - 5.1 mmol/L   Chloride 108 101 - 111 mmol/L   CO2 25 22 - 32 mmol/L   Glucose, Bld 70 65 - 99 mg/dL   BUN 9 6 - 20 mg/dL   Creatinine, Ser 0.77 0.61 - 1.24 mg/dL   Calcium 9.1 8.9 - 10.3 mg/dL   Total Protein 6.9 6.5 - 8.1 g/dL   Albumin 3.6 3.5 - 5.0 g/dL   AST 21 15 - 41 U/L   ALT 11 (L) 17 - 63 U/L   Alkaline Phosphatase 75 38 - 126 U/L   Total Bilirubin 0.6 0.3 - 1.2 mg/dL   GFR calc non Af  Amer >60 >60 mL/min   GFR calc Af Amer >60 >60 mL/min    Comment: (NOTE) The eGFR has been calculated using the CKD EPI equation. This calculation has not been validated in all clinical situations. eGFR's persistently <60 mL/min signify possible Chronic Kidney Disease.    Anion gap 5 5 - 15  Protime-INR     Status: None   Collection Time: 06/11/15  9:07 AM  Result Value Ref Range   Prothrombin Time 14.6 11.6 - 15.2 seconds   INR 1.12 0.00 - 1.49  Type and screen     Status: None   Collection Time: 06/11/15  9:10 AM  Result Value Ref Range   ABO/RH(D) O POS    Antibody Screen NEG    Sample Expiration 06/25/2015      Estimated body mass index is 29.76 kg/(m^2) as calculated from the following:   Height as of this encounter: 5' 10"  (1.778 m).   Weight as of this encounter: 207 lb 6 oz (94.065 kg).   Imaging Review Plain radiographs demonstrate severe degenerative joint disease of the right knee(s). The overall alignment issignificant varus. The bone quality appears to be fair for age and reported activity level.  Assessment/Plan:  End stage arthritis, right knee   The patient history, physical examination, clinical judgment of the provider and imaging studies are consistent with end stage degenerative joint disease of the right knee(s) and total knee arthroplasty is deemed medically necessary. The treatment options including medical management, injection therapy arthroscopy and arthroplasty were discussed at length. The risks and benefits of total knee arthroplasty were presented and reviewed. The risks due to aseptic loosening, infection, stiffness, patella tracking problems,  thromboembolic complications and other imponderables were discussed. The patient acknowledged the explanation, agreed to proceed with the plan and consent was signed. Patient is being admitted for inpatient treatment for surgery, pain control, PT, OT, prophylactic antibiotics, VTE prophylaxis, progressive  ambulation and ADL's and discharge planning. The patient is planning to be discharged home with home health services

## 2015-06-22 NOTE — Transfer of Care (Signed)
Immediate Anesthesia Transfer of Care Note  Patient: Jordan Bailey  Procedure(s) Performed: Procedure(s): RIGHT TOTAL KNEE ARTHROPLASTY (Right)  Patient Location: PACU  Anesthesia Type:General and Regional  Level of Consciousness: awake, oriented, sedated and patient cooperative  Airway & Oxygen Therapy: Patient Spontanous Breathing and Patient connected to nasal cannula oxygen  Post-op Assessment: Report given to RN, Post -op Vital signs reviewed and stable and Patient moving all extremities  Post vital signs: Reviewed and stable  Last Vitals:  Filed Vitals:   06/22/15 1049  BP: 107/58  Pulse: 50  Temp: 36.2 C  Resp: 20    Complications: No apparent anesthesia complications

## 2015-06-22 NOTE — Anesthesia Procedure Notes (Signed)
Anesthesia Regional Block:  Adductor canal block  Pre-Anesthetic Checklist: ,, timeout performed, Correct Patient, Correct Site, Correct Laterality, Correct Procedure, Correct Position, site marked, Risks and benefits discussed,  Surgical consent,  Pre-op evaluation,  At surgeon's request and post-op pain management  Laterality: Right and Lower  Prep: chloraprep       Needles:  Injection technique: Single-shot  Needle Type: Echogenic Needle     Needle Length: 9cm 9 cm Needle Gauge: 22 and 22 G    Additional Needles:  Procedures: ultrasound guided (picture in chart) Adductor canal block Narrative:  Start time: 06/22/2015 12:26 PM End time: 06/22/2015 12:31 PM Injection made incrementally with aspirations every 5 mL.  Performed by: Personally  Anesthesiologist: Jean Rosenthal, [facility]  Additional Notes: Pt identified in Holding room.  Monitors applied. Working IV access confirmed. Sterile prep, drape R thigh.  #22ga ECHOgenic needle into adductor canal with US guidance.  30cc 0.5% Bupivacaine with 1:200k epi injected incrementally after negative test dose.  Patient asymptomatic, VSS, no heme aspirated, tolerated well.  Sandford Craze, MD

## 2015-06-22 NOTE — Discharge Instructions (Signed)

## 2015-06-22 NOTE — Progress Notes (Signed)
Utilization review completed.  

## 2015-06-22 NOTE — Anesthesia Preprocedure Evaluation (Addendum)
Anesthesia Evaluation  Patient identified by MRN, date of birth, ID band Patient awake    Reviewed: Allergy & Precautions, H&P , NPO status , Patient's Chart, lab work & pertinent test results  History of Anesthesia Complications Negative for: history of anesthetic complications  Airway Mallampati: I  TM Distance: >3 FB Neck ROM: Full    Dental  (+) Teeth Intact, Dental Advidsory Given   Pulmonary Current Smoker,    breath sounds clear to auscultation       Cardiovascular (-) anginanegative cardio ROS   Rhythm:regular Rate:Bradycardia     Neuro/Psych negative neurological ROS     GI/Hepatic negative GI ROS, Neg liver ROS,   Endo/Other  S/p gastric sleeve  Renal/GU negative Renal ROS     Musculoskeletal  (+) Arthritis , Osteoarthritis,    Abdominal (+) + obese,   Peds  Hematology  (+) Blood dyscrasia (Hb 12.2), ,   Anesthesia Other Findings   Reproductive/Obstetrics                           Anesthesia Physical Anesthesia Plan  ASA: II  Anesthesia Plan: General   Post-op Pain Management: GA combined w/ Regional for post-op pain   Induction: Intravenous  Airway Management Planned: LMA  Additional Equipment:   Intra-op Plan:   Post-operative Plan:   Informed Consent: I have reviewed the patients History and Physical, chart, labs and discussed the procedure including the risks, benefits and alternatives for the proposed anesthesia with the patient or authorized representative who has indicated his/her understanding and acceptance.   Dental advisory given  Plan Discussed with: CRNA and Surgeon  Anesthesia Plan Comments: (Plan routine monitors, GA- LMA OK, adductor canal block for post op analgesia (pt declines SAB))        Anesthesia Quick Evaluation

## 2015-06-22 NOTE — Op Note (Signed)
Jordan Bailey, Jordan Bailey                ACCOUNT NO.:  [id number]  MEDICAL RECORD NO.:  [id number]  LOCATION:  5N29C                        FACILITY:  [facility]  PHYSICIAN:  Harvie Junior, M.D.   DATE OF BIRTH:  January 12, 1966  DATE OF PROCEDURE:  06/22/2015 DATE OF DISCHARGE:                              OPERATIVE REPORT   PREOPERATIVE DIAGNOSIS:  End-stage degenerative joint disease, right knee.  POSTOPERATIVE DIAGNOSIS:  End-stage degenerative joint disease, right knee.  PROCEDURE:  Right total knee replacement with a Sigma system, size 4 femur, size 4 MBT revision style tray, 12.5 mm bridging bearing, and a 38 mm all polyethylene patella.  SURGEON:  Harvie Junior, M.D.  ASSISTANT:  Marshia Ly, PA.  ANESTHESIA:  General.  BRIEF HISTORY:  Mr. Szczepanik is a 49 year old male with a long history of severe arthritis of bilateral knees.  He was very much overweight with a weight of greater than 500 pounds.  He ultimately has lost dramatic weight; and after weight loss, he came to see his x-rays which showed severe osteoarthritis of both knees with severe varus malalignment.  We treated his left knee which unfortunately had some previous surgery and issues related to quad tendon.  He did reasonably well with that, had great pain relief.  He was still working on improving his functional situation and felt that right total knee needed to be done; and at this point, we felt that this was appropriate.  He was brought to the operating room for right total knee replacement.  DESCRIPTION OF PROCEDURE:  The patient was brought to the operating room.  After adequate anesthesia was obtained with general anesthetic, the patient was placed supine on the operating table.  The right leg was prepped and draped in usual sterile fashion.  Following this, the leg was exsanguinated and the blood pressure tourniquet was inflated to 300 mmHg.  Following this, a midline incision was made, subcutaneous  tissues down to the level of the sensor mechanism, and a medial parapatellar arthrotomy was undertaken.  Once this was done, attention was turned to the removal of synovial aspect on the anterior aspect of the femur, retropatellar fat pad, medial and lateral meniscus, anterior and posterior cruciate, all being excised.  Once this was done, attention was turned to the femur.  An intramedullary pilot hole was drilled, followed by a long alignment rod, followed by resection of the distal femur in the 5-degree valgus inclination with 11 mm of distal bone. Once this was completed, attention was turned to the femur.  It sized at this time to a 4.  The opposite side sized to a 3, but it sized this to a 4, and we put on a 4 block.  This was then put into place and anterior- posterior cuts were made, chamfers and box.  Attention was then turned to the tibia which was sized to a 4.  It was drilled and keeled, and rotational alignment was set with a keel at this point.  Once this was done, the components were left in place.  Attention was turned to the patella, which was cut down to the level of 13 mm and a 38 paddle was  chosen and lugs were drilled into the area of the patella.  Once this was done, all trial components of the knee were put through a range of motion.  Excellent stability and range of motion were achieved. Attention was turned towards removal of trial components, and then the knee was again copiously irrigated, suctioned dry.  Once this was completed, the final components were cemented in place, size 4 femur, size 4 tibia with an MBT revision style tray; a 12.5 mm bridging bearing trial was placed, and the leg was held in slight flexion, but it can easily go into full extension.  Once this was done, attention was turned to the patella where the patella button was placed and held in place with a clamp.  All excess bone cement was removed at this time, and a 12.5 bridging bearing trial  has been placed.  Once this was done, 40 mL of Marcaine mixed with Exparel were instilled in the synovial reflection all around the knee and into the subcutaneous tissue.  Once this was completed, attention was turned towards the removal of the trial components.  Tranexamic acid 2 g with 50 mL of saline were instilled in the knee for 3 minutes and then suctioned dry.  The tourniquet was let down.  All bleeding was controlled with electrocautery, and the final 12.5 poly was opened in place.  Excellent range of motion and stability were achieved.  The medial parapatellar arthrotomy was closed with a 1 Vicryl running, the skin with 0 and 2-0 Vicryl, and the skin with staples.  Sterile compressive dressing was applied.  The patient was taken to recovery room in satisfactory condition with risks of all these procedures being minimal.     Harvie Junior, M.D.     Ranae Plumber  D:  06/22/2015  T:  06/22/2015  Job:  [id number]

## 2015-06-22 NOTE — Brief Op Note (Signed)
06/22/2015  3:35 PM  PATIENT:  Jordan Bailey  49 y.o. male  PRE-OPERATIVE DIAGNOSIS:  SEVERE ENDSTAGE DEGENERATIVE JOINT DISEASE RIGHT KNEE  POST-OPERATIVE DIAGNOSIS:  SEVERE ENDSTAGE DEGENERATIVE JOINT DISEASE RIGHT KNEE  PROCEDURE:  Procedure(s): RIGHT TOTAL KNEE ARTHROPLASTY (Right)  SURGEON:  Surgeon(s) and Role:    * Jodi Geralds, MD - Primary  PHYSICIAN ASSISTANT:   ASSISTANTS: bethune   ANESTHESIA:   general  EBL:  Total I/O In: 2000 [I.V.:2000] Out: 550 [Urine:300; Blood:250]  BLOOD ADMINISTERED:none  DRAINS: none and (1 med) Hemovact drain(s) in the r knee with  Suction Open   LOCAL MEDICATIONS USED:  NONE  SPECIMEN:  No Specimen  DISPOSITION OF SPECIMEN:  N/A  COUNTS:  YES  TOURNIQUET:   Total Tourniquet Time Documented: Thigh (Right) - 60 minutes Total: Thigh (Right) - 60 minutes   DICTATION: .Other Dictation: Dictation Number [id number]  PLAN OF CARE: Admit to inpatient   PATIENT DISPOSITION:  PACU - hemodynamically stable.   Delay start of Pharmacological VTE agent (>24hrs) due to surgical blood loss or risk of bleeding: no

## 2015-06-23 ENCOUNTER — Encounter (HOSPITAL_COMMUNITY): Payer: Self-pay | Admitting: *Deleted

## 2015-06-23 LAB — CBC
HCT: 30.7 % — ABNORMAL LOW (ref 39.0–52.0)
HEMOGLOBIN: 9.9 g/dL — AB (ref 13.0–17.0)
MCH: 23.9 pg — ABNORMAL LOW (ref 26.0–34.0)
MCHC: 32.2 g/dL (ref 30.0–36.0)
MCV: 74 fL — AB (ref 78.0–100.0)
PLATELETS: 161 10*3/uL (ref 150–400)
RBC: 4.15 MIL/uL — AB (ref 4.22–5.81)
RDW: 15.7 % — ABNORMAL HIGH (ref 11.5–15.5)
WBC: 5.9 10*3/uL (ref 4.0–10.5)

## 2015-06-23 LAB — BASIC METABOLIC PANEL
ANION GAP: 5 (ref 5–15)
BUN: 13 mg/dL (ref 6–20)
CHLORIDE: 106 mmol/L (ref 101–111)
CO2: 23 mmol/L (ref 22–32)
Calcium: 8.4 mg/dL — ABNORMAL LOW (ref 8.9–10.3)
Creatinine, Ser: 0.82 mg/dL (ref 0.61–1.24)
GFR calc Af Amer: >60 mL/min (ref >60)
GLUCOSE: 131 mg/dL — AB (ref 65–99)
POTASSIUM: 4.1 mmol/L (ref 3.5–5.1)
SODIUM: 134 mmol/L — AB (ref 135–145)

## 2015-06-23 NOTE — Progress Notes (Signed)
Subjective: 1 Day Post-Op Procedure(s) (LRB): RIGHT TOTAL KNEE ARTHROPLASTY (Right) Patient reports pain as mild. Taking by mouth and voiding okay. "I want to go home today " Range of motion on CPM 0 -120.   Objective: Vital signs in last 24 hours: Temp:  [97.1 F (36.2 C)-98.6 F (37 C)] 97.6 F (36.4 C) (09/20 0609) Pulse Rate:  [44-84] 46 (09/20 0609) Resp:  [5-20] 18 (09/20 0609) BP: (107-128)/(58-80) 110/63 mmHg (09/20 0609) SpO2:  [99 %-100 %] 99 % (09/20 0609) Weight:  [94.065 kg (207 lb 6 oz)] 94.065 kg (207 lb 6 oz) (09/19 1049)  Intake/Output from previous day: 09/19 0701 - 09/20 0700 In: 2480 [P.O.:480; I.V.:2000] Out: 1175 [Urine:600; Drains:325; Blood:250] Intake/Output this shift: Total I/O In: 280 [P.O.:280] Out: -    Recent Labs  06/23/15 0530  HGB 9.9*    Recent Labs  06/23/15 0530  WBC 5.9  RBC 4.15*  HCT 30.7*  PLT 161    Recent Labs  06/23/15 0530  NA 134*  K 4.1  CL 106  CO2 23  BUN 13  CREATININE 0.82  GLUCOSE 131*  CALCIUM 8.4*   No results for input(s): LABPT, INR in the last 72 hours. Right lower extremity exam: Neurovascular intact Sensation intact distally Intact pulses distally Dorsiflexion/Plantar flexion intact Incision: dressing C/D/I Compartment soft Hemovac drain intact.  Assessment/Plan: 1 Day Post-Op Procedure(s) (LRB): RIGHT TOTAL KNEE ARTHROPLASTY (Right) Plan: Hemovac drain pulled. Weight-bear as tolerated on right. Discharge home with home health this afternoon after physical therapy. Follow-up with Dr. Luiz Blare in 2 weeks.  BETHUNE,JAMES G 06/23/2015, 9:37 AM

## 2015-06-23 NOTE — Evaluation (Signed)
Physical Therapy Evaluation Patient Details Name: Jordan Bailey MRN: [id number] DOB: Mar 11, 1966 Today's Date: 06/23/2015   History of Present Illness  Rt TKA  Clinical Impression  Pt is s/p TKA resulting in the deficits listed below (see PT Problem List). Pt will benefit from skilled PT to increase their independence and safety with mobility to allow discharge to home with family assistance.       Follow Up Recommendations Home health PT;Supervision for mobility/OOB    Equipment Recommendations  None recommended by PT;Other (comment) (patient reports having all equipment need met already)    Recommendations for Other Services       Precautions / Restrictions Precautions Precautions: Knee;Fall Precaution Booklet Issued: Yes (comment) Precaution Comments: HEP provided Required Braces or Orthoses: Knee Immobilizer - Right Knee Immobilizer - Right: On when out of bed or walking Restrictions Weight Bearing Restrictions: Yes RLE Weight Bearing: Weight bearing as tolerated      Mobility  Bed Mobility Overal bed mobility: Needs Assistance Bed Mobility: Supine to Sit     Supine to sit: Supervision        Transfers Overall transfer level: Needs assistance Equipment used: Rolling walker (2 wheeled) Transfers: Sit to/from Stand Sit to Stand: Min assist         General transfer comment: needing 2 attempts to stand  Ambulation/Gait Ambulation/Gait assistance: Min guard Ambulation Distance (Feet): 100 Feet Assistive device: Rolling walker (2 wheeled) Gait Pattern/deviations: Step-to pattern;Decreased stance time - right;Decreased weight shift to right Gait velocity: decreased   General Gait Details: using knee immobilizer during ambulation   Stairs            Wheelchair Mobility    Modified Rankin (Stroke Patients Only)       Balance Overall balance assessment: Needs assistance Sitting-balance support: No upper extremity supported Sitting balance-Leahy  Scale: Good     Standing balance support: Bilateral upper extremity supported Standing balance-Leahy Scale: Poor Standing balance comment: using rw                             Pertinent Vitals/Pain Pain Assessment: Faces Faces Pain Scale: Hurts a little bit Pain Location: Rt knee Pain Descriptors / Indicators: Sore Pain Intervention(s): Limited activity within patient's tolerance;Monitored during session    Home Living Family/patient expects to be discharged to:: Private residence Living Arrangements: Spouse/significant other;Children Available Help at Discharge: Family Type of Home: Apartment Home Access: Level entry     Home Layout: One level Home Equipment: Environmental consultant - 2 wheels Additional Comments: states he has all the equipment he needs    Prior Function Level of Independence: Independent               Hand Dominance        Extremity/Trunk Assessment               Lower Extremity Assessment: RLE deficits/detail RLE Deficits / Details: Independent SLR       Communication   Communication: No difficulties  Cognition Arousal/Alertness: Awake/alert Behavior During Therapy: WFL for tasks assessed/performed Overall Cognitive Status: Within Functional Limits for tasks assessed                      General Comments      Exercises        Assessment/Plan    PT Assessment Patient needs continued PT services  PT Diagnosis Difficulty walking;Acute pain   PT Problem List Decreased strength;Decreased range  of motion;Decreased activity tolerance;Decreased balance;Decreased mobility;Decreased knowledge of use of DME;Pain  PT Treatment Interventions DME instruction;Gait training;Stair training;Functional mobility training;Therapeutic activities;Therapeutic exercise;Balance training   PT Goals (Current goals can be found in the Care Plan section) Acute Rehab PT Goals Patient Stated Goal: go home later today PT Goal Formulation: With  patient Time For Goal Achievement: 07/07/15 Potential to Achieve Goals: Good    Frequency 7X/week   Barriers to discharge        Co-evaluation               End of Session Equipment Utilized During Treatment: Gait belt;Right knee immobilizer Activity Tolerance: Patient tolerated treatment well;No increased pain Patient left: in chair;with call bell/phone within reach;with family/visitor present;Other (comment) (in knee extension stretch) Nurse Communication: Mobility status         Time: 0921-0955 PT Time Calculation (min) (ACUTE ONLY): 34 min   Charges:   PT Evaluation $Initial PT Evaluation Tier I: 1 Procedure PT Treatments $Gait Training: 8-22 mins   PT G Codes:        Benjamin J. Zaino, PT, CSCS Pager [phone] Office [phone]  06/23/2015, 12:13 PM   

## 2015-06-23 NOTE — Anesthesia Postprocedure Evaluation (Signed)
  Anesthesia Post-op Note  Patient: Jordan Bailey  Procedure(s) Performed: Procedure(s): RIGHT TOTAL KNEE ARTHROPLASTY (Right)  Patient Location: PACU  Anesthesia Type:GA combined with regional for post-op pain  Level of Consciousness: awake  Airway and Oxygen Therapy: Patient Spontanous Breathing  Post-op Pain: mild  Post-op Assessment: Post-op Vital signs reviewed, Patient's Cardiovascular Status Stable, Respiratory Function Stable, Patent Airway, No signs of Nausea or vomiting and Pain level controlled     RLE Motor Response: Purposeful movement, Responds to commands RLE Sensation: No numbness      Post-op Vital Signs: Reviewed and stable  Last Vitals:  Filed Vitals:   06/23/15 0609  BP: 110/63  Pulse: 46  Temp: 36.4 C  Resp: 18    Complications: No apparent anesthesia complications

## 2015-06-23 NOTE — Progress Notes (Signed)
Physical Therapy Treatment Patient Details Name: Jordan Bailey MRN: [id number] DOB: July 20, 1966 Today's Date: 06/23/2015    History of Present Illness Rt TKA    PT Comments    Patient is making good progress with PT.  From a mobility standpoint anticipate patient will be ready for DC home with family support. Patient denies any questions or concerns.       Follow Up Recommendations  Home health PT;Supervision for mobility/OOB     Equipment Recommendations  None recommended by PT    Recommendations for Other Services       Precautions / Restrictions Precautions Precautions: Knee Precaution Booklet Issued: Yes (comment) Precaution Comments: HEP provided Required Braces or Orthoses: Knee Immobilizer - Right Knee Immobilizer - Right: On when out of bed or walking Restrictions Weight Bearing Restrictions: Yes RLE Weight Bearing: Weight bearing as tolerated    Mobility  Bed Mobility     General bed mobility comments: found and returned to sitting  Transfers Overall transfer level: Needs assistance Equipment used: Rolling walker (2 wheeled) Transfers: Sit to/from Stand Sit to Stand: Min guard         General transfer comment: single attempt  Ambulation/Gait Ambulation/Gait assistance: Supervision Ambulation Distance (Feet): 150 Feet Assistive device: Rolling walker (2 wheeled) Gait Pattern/deviations: Step-through pattern;Decreased step length - right;Decreased weight shift to right Gait velocity: decreased   General Gait Details: using knee immobilizer during ambulation    Stairs            Wheelchair Mobility    Modified Rankin (Stroke Patients Only)       Balance Overall balance assessment: Needs assistance Sitting-balance support: No upper extremity supported Sitting balance-Leahy Scale: Good     Standing balance support: Bilateral upper extremity supported Standing balance-Leahy Scale: Poor Standing balance comment: using rw in standing                    Cognition Arousal/Alertness: Awake/alert Behavior During Therapy: WFL for tasks assessed/performed Overall Cognitive Status: Within Functional Limits for tasks assessed                      Exercises Total Joint Exercises Ankle Circles/Pumps: AROM;Both;15 reps Quad Sets: Strengthening;Right;10 reps Towel Squeeze: Strengthening;Both;10 reps Short Arc Quad: Strengthening;Right;10 reps Heel Slides: AAROM;Right;10 reps Hip ABduction/ADduction: Strengthening;Right;5 reps Straight Leg Raises: Strengthening;Right;10 reps Long Arc Quad: Strengthening;Right;10 reps Knee Flexion: AROM;Right;10 reps;Seated Goniometric ROM: 108 degrees flexion    General Comments        Pertinent Vitals/Pain Pain Assessment: 0-10 Pain Score: 3  Faces Pain Scale: Hurts a little bit Pain Location: Rt knee Pain Descriptors / Indicators: Sore Pain Intervention(s): Limited activity within patient's tolerance;Monitored during session    Home Living     Prior Function           PT Goals (current goals can now be found in the care plan section) Acute Rehab PT Goals Patient Stated Goal: go home PT Goal Formulation: With patient Time For Goal Achievement: 07/07/15 Potential to Achieve Goals: Good Progress towards PT goals: Progressing toward goals    Frequency  7X/week    PT Plan Current plan remains appropriate    Co-evaluation             End of Session Equipment Utilized During Treatment: Gait belt;Right knee immobilizer Activity Tolerance: Patient tolerated treatment well Patient left: in chair;with call bell/phone within reach;Other (comment) (in knee extension stretch)     Time: 4098-1191 PT Time Calculation (min) (  ACUTE ONLY): 25 min  Charges:  $Gait Training: 8-22 mins $Therapeutic Exercise: 8-22 mins                    G Codes:      Christiane Ha, PT, CSCS Pager [phone] Office [phone]  06/23/2015, 3:30 PM

## 2015-06-23 NOTE — Care Management Note (Signed)
Case Management Note  Patient Details  Name: Jordan Bailey MRN: [id number] Date of Birth: [birth year]  Subjective/Objective:      S/p right total knee arthroplasty              Action/Plan:  Set up with [facility] for HHPT by MD office. Spoke with patient, no change in discharge plan. Patient stated that he has a rolling walker and 3N1 at home and [employer] has delivered CPM to home. Patient Stated that his wife will be able to assist after discharge. Spoke with Annita Brod at [facility] and verified that they will start service on 06/24/15.      Expected Discharge Date:                  Expected Discharge Plan:  Home w Home Health Services  In-House Referral:  NA  Discharge planning Services  CM Consult  Post Acute Care Choice:  Home Health Choice offered to:  Patient  DME Arranged:    DME Agency:     HH Arranged:  PT HH Agency:  [facility]  Status of Service:  Completed, signed off  Medicare Important Message Given:    Date Medicare IM Given:    Medicare IM give by:    Date Additional Medicare IM Given:    Additional Medicare Important Message give by:     If discussed at Long Length of Stay Meetings, dates discussed:    Additional Comments:  Ganon, Demasi, RN 06/23/2015, 10:40 AM

## 2015-06-23 NOTE — Discharge Summary (Signed)
Patient ID: Jordan Bailey MRN: [id number] DOB/AGE: 1966/05/29 49 y.o.  Admit date: 06/22/2015 Discharge date: 06/23/2015  Admission Diagnoses:  Principal Problem:   Primary osteoarthritis of right knee Active Problems:   Obesity   Discharge Diagnoses:  Same  Past Medical History  Diagnosis Date  . Kidney stones   . Arthritis   . Iron deficiency anemia     Surgeries: Procedure(s): RIGHT TOTAL KNEE ARTHROPLASTY on 06/22/2015   Discharged Condition: Improved  Hospital Course: Jordan Bailey is an 49 y.o. male who was admitted 06/22/2015 for operative treatment ofPrimary osteoarthritis of right knee. Patient has severe unremitting pain that affects sleep, daily activities, and work/hobbies. After pre-op clearance the patient was taken to the operating room on 06/22/2015 and underwent  Procedure(s): RIGHT TOTAL KNEE ARTHROPLASTY.    Patient was given perioperative antibiotics: Anti-infectives    Start     Dose/Rate Route Frequency Ordered Stop   06/22/15 2000  ceFAZolin (ANCEF) IVPB 2 g/50 mL premix     2 g 100 mL/hr over 30 Minutes Intravenous Every 6 hours 06/22/15 1713 06/23/15 0551   06/22/15 1230  ceFAZolin (ANCEF) IVPB 2 g/50 mL premix     2 g 100 mL/hr over 30 Minutes Intravenous To ShortStay Surgical 06/22/15 1048 06/22/15 1301       Patient was given sequential compression devices, early ambulation, and chemoprophylaxis to prevent DVT. The patient was doing great on postoperative day #1. He had no significant amount of pain. He was progressing with physical therapy he had already done 0-120 degrees on his CPM machine. He was doing a straight leg raise without difficulty. His Hemovac drain was pulled. He was to be seen by physical therapy and then discharged home once passing physical therapy.  Patient benefited maximally from hospital stay and there were no complications.    Recent vital signs: Patient Vitals for the past 24 hrs:  BP Temp Temp src Pulse Resp SpO2 Height  Weight  06/23/15 0609 110/63 mmHg 97.6 F (36.4 C) Oral (!) 46 18 99 % - -  06/22/15 2011 128/68 mmHg 98.6 F (37 C) Oral 84 18 100 % - -  06/22/15 1615 127/80 mmHg 97.5 F (36.4 C) Oral (!) 55 15 100 % - -  06/22/15 1601 - 97.7 F (36.5 C) - (!) 54 11 100 % - -  06/22/15 1600 - - - (!) 58 11 100 % - -  06/22/15 1545 - - - (!) 45 (!) 8 100 % - -  06/22/15 1530 - - - (!) 44 (!) 5 100 % - -  06/22/15 1518 116/62 mmHg - - (!) 45 (!) 8 100 % - -  06/22/15 1515 - - - (!) 46 (!) 7 100 % - -  06/22/15 1503 113/79 mmHg 97.6 F (36.4 C) - (!) 57 (!) 7 100 % - -  06/22/15 1049 (!) 107/58 mmHg 97.1 F (36.2 C) Oral (!) 50 20 100 %  (1.778 m) 94.065 kg (207 lb 6 oz)     Recent laboratory studies:  Recent Labs  06/23/15 0530  WBC 5.9  HGB 9.9*  HCT 30.7*  PLT 161  NA 134*  K 4.1  CL 106  CO2 23  BUN 13  CREATININE 0.82  GLUCOSE 131*  CALCIUM 8.4*     Discharge Medications:     Medication List    STOP taking these medications        HYDROcodone-acetaminophen 10-325 MG per tablet  Commonly known as:  NORCO     HYDROcodone-acetaminophen 5-325 MG per tablet  Commonly known as:  NORCO/VICODIN     Iron Tabs      TAKE these medications        docusate sodium 100 MG capsule  Commonly known as:  COLACE  Take 100 mg by mouth daily.     ferrous sulfate 325 (65 FE) MG EC tablet  Take 1 tablet (325 mg total) by mouth 3 (three) times daily with meals.     LASIX 20 MG tablet  Generic drug:  furosemide  Take 10 mg by mouth daily.     methocarbamol 750 MG tablet  Commonly known as:  ROBAXIN-750  Take 1 tablet (750 mg total) by mouth every 8 (eight) hours as needed for muscle spasms.     multivitamin with minerals tablet  Take 1 tablet by mouth daily.     oxyCODONE-acetaminophen 5-325 MG per tablet  Commonly known as:  PERCOCET/ROXICET  Take 1-2 tablets by mouth every 4 (four) hours as needed for severe pain.     rivaroxaban 10 MG Tabs tablet  Commonly known as:   XARELTO  Take 1 tablet (10 mg total) by mouth daily. Take until gone.        Diagnostic Studies: No results found.  Disposition: 06-Home-Health Care Svc      Discharge Instructions    Call MD / Call 911    Complete by:  As directed   If you experience chest pain or shortness of breath, CALL 911 and be transported to the hospital emergency room.  If you develope a fever above 101 F, pus (white drainage) or increased drainage or redness at the wound, or calf pain, call your surgeon's office.     Constipation Prevention    Complete by:  As directed   Drink plenty of fluids.  Prune juice may be helpful.  You may use a stool softener, such as Colace (over the counter) 100 mg twice a day.  Use MiraLax (over the counter) for constipation as needed.     Diet general    Complete by:  As directed      Increase activity slowly as tolerated    Complete by:  As directed      Weight bearing as tolerated    Complete by:  As directed   Laterality:  right  Extremity:  Lower           Follow-up Information    Follow up with GRAVES,JOHN L, MD. Schedule an appointment as soon as possible for a visit in 2 weeks.   Specialty:  Orthopedic Surgery   Contact information:   [address] [place] [address] [phone]        Signed: Matthew Folks 06/23/2015, 9:41 AM

## 2015-06-23 NOTE — Progress Notes (Signed)
Orthopedic Tech Progress Note Patient Details:  Jordan Bailey [birth year] [id number] OHF applied to bed Patient ID: [name], male   DOB: [birth year], 49 y.o.   MRN: [id number]   Orie Rout 06/23/2015, 6:48 AM

## 2015-06-24 DIAGNOSIS — Z96651 Presence of right artificial knee joint: Secondary | ICD-10-CM | POA: Diagnosis not present

## 2015-06-24 DIAGNOSIS — Z96652 Presence of left artificial knee joint: Secondary | ICD-10-CM | POA: Diagnosis not present

## 2015-06-24 DIAGNOSIS — Z87891 Personal history of nicotine dependence: Secondary | ICD-10-CM | POA: Diagnosis not present

## 2015-06-24 DIAGNOSIS — Z471 Aftercare following joint replacement surgery: Secondary | ICD-10-CM | POA: Diagnosis not present

## 2015-06-24 DIAGNOSIS — M2042 Other hammer toe(s) (acquired), left foot: Secondary | ICD-10-CM | POA: Diagnosis not present

## 2015-06-24 DIAGNOSIS — E669 Obesity, unspecified: Secondary | ICD-10-CM | POA: Diagnosis not present

## 2015-06-24 DIAGNOSIS — Z6827 Body mass index (BMI) 27.0-27.9, adult: Secondary | ICD-10-CM | POA: Diagnosis not present

## 2015-06-24 DIAGNOSIS — D649 Anemia, unspecified: Secondary | ICD-10-CM | POA: Diagnosis not present

## 2015-06-24 DIAGNOSIS — Z9884 Bariatric surgery status: Secondary | ICD-10-CM | POA: Diagnosis not present

## 2015-06-26 DIAGNOSIS — E669 Obesity, unspecified: Secondary | ICD-10-CM | POA: Diagnosis not present

## 2015-06-26 DIAGNOSIS — M2042 Other hammer toe(s) (acquired), left foot: Secondary | ICD-10-CM | POA: Diagnosis not present

## 2015-06-26 DIAGNOSIS — Z96651 Presence of right artificial knee joint: Secondary | ICD-10-CM | POA: Diagnosis not present

## 2015-06-26 DIAGNOSIS — Z471 Aftercare following joint replacement surgery: Secondary | ICD-10-CM | POA: Diagnosis not present

## 2015-06-26 DIAGNOSIS — D649 Anemia, unspecified: Secondary | ICD-10-CM | POA: Diagnosis not present

## 2015-06-26 DIAGNOSIS — Z6827 Body mass index (BMI) 27.0-27.9, adult: Secondary | ICD-10-CM | POA: Diagnosis not present

## 2015-06-29 DIAGNOSIS — Z96651 Presence of right artificial knee joint: Secondary | ICD-10-CM | POA: Diagnosis not present

## 2015-06-29 DIAGNOSIS — M2042 Other hammer toe(s) (acquired), left foot: Secondary | ICD-10-CM | POA: Diagnosis not present

## 2015-06-29 DIAGNOSIS — Z471 Aftercare following joint replacement surgery: Secondary | ICD-10-CM | POA: Diagnosis not present

## 2015-06-29 DIAGNOSIS — E669 Obesity, unspecified: Secondary | ICD-10-CM | POA: Diagnosis not present

## 2015-06-29 DIAGNOSIS — Z6827 Body mass index (BMI) 27.0-27.9, adult: Secondary | ICD-10-CM | POA: Diagnosis not present

## 2015-06-29 DIAGNOSIS — D649 Anemia, unspecified: Secondary | ICD-10-CM | POA: Diagnosis not present

## 2015-07-01 DIAGNOSIS — Z471 Aftercare following joint replacement surgery: Secondary | ICD-10-CM | POA: Diagnosis not present

## 2015-07-01 DIAGNOSIS — Z96651 Presence of right artificial knee joint: Secondary | ICD-10-CM | POA: Diagnosis not present

## 2015-07-01 DIAGNOSIS — D649 Anemia, unspecified: Secondary | ICD-10-CM | POA: Diagnosis not present

## 2015-07-01 DIAGNOSIS — E669 Obesity, unspecified: Secondary | ICD-10-CM | POA: Diagnosis not present

## 2015-07-01 DIAGNOSIS — M2042 Other hammer toe(s) (acquired), left foot: Secondary | ICD-10-CM | POA: Diagnosis not present

## 2015-07-01 DIAGNOSIS — Z6827 Body mass index (BMI) 27.0-27.9, adult: Secondary | ICD-10-CM | POA: Diagnosis not present

## 2015-07-02 DIAGNOSIS — Z471 Aftercare following joint replacement surgery: Secondary | ICD-10-CM | POA: Diagnosis not present

## 2015-07-02 DIAGNOSIS — Z96651 Presence of right artificial knee joint: Secondary | ICD-10-CM | POA: Diagnosis not present

## 2015-07-03 DIAGNOSIS — Z471 Aftercare following joint replacement surgery: Secondary | ICD-10-CM | POA: Diagnosis not present

## 2015-07-03 DIAGNOSIS — D649 Anemia, unspecified: Secondary | ICD-10-CM | POA: Diagnosis not present

## 2015-07-03 DIAGNOSIS — Z6827 Body mass index (BMI) 27.0-27.9, adult: Secondary | ICD-10-CM | POA: Diagnosis not present

## 2015-07-03 DIAGNOSIS — M2042 Other hammer toe(s) (acquired), left foot: Secondary | ICD-10-CM | POA: Diagnosis not present

## 2015-07-03 DIAGNOSIS — E669 Obesity, unspecified: Secondary | ICD-10-CM | POA: Diagnosis not present

## 2015-07-03 DIAGNOSIS — Z96651 Presence of right artificial knee joint: Secondary | ICD-10-CM | POA: Diagnosis not present

## 2015-07-06 DIAGNOSIS — E669 Obesity, unspecified: Secondary | ICD-10-CM | POA: Diagnosis not present

## 2015-07-06 DIAGNOSIS — M2042 Other hammer toe(s) (acquired), left foot: Secondary | ICD-10-CM | POA: Diagnosis not present

## 2015-07-06 DIAGNOSIS — Z471 Aftercare following joint replacement surgery: Secondary | ICD-10-CM | POA: Diagnosis not present

## 2015-07-06 DIAGNOSIS — Z96651 Presence of right artificial knee joint: Secondary | ICD-10-CM | POA: Diagnosis not present

## 2015-07-06 DIAGNOSIS — Z6827 Body mass index (BMI) 27.0-27.9, adult: Secondary | ICD-10-CM | POA: Diagnosis not present

## 2015-07-06 DIAGNOSIS — D649 Anemia, unspecified: Secondary | ICD-10-CM | POA: Diagnosis not present

## 2015-07-14 DIAGNOSIS — L732 Hidradenitis suppurativa: Secondary | ICD-10-CM | POA: Diagnosis not present

## 2015-07-14 DIAGNOSIS — R600 Localized edema: Secondary | ICD-10-CM | POA: Diagnosis not present

## 2015-07-14 DIAGNOSIS — M2550 Pain in unspecified joint: Secondary | ICD-10-CM | POA: Diagnosis not present

## 2015-07-14 DIAGNOSIS — M7989 Other specified soft tissue disorders: Secondary | ICD-10-CM | POA: Diagnosis not present

## 2015-07-14 DIAGNOSIS — Z01812 Encounter for preprocedural laboratory examination: Secondary | ICD-10-CM | POA: Diagnosis not present

## 2015-07-14 DIAGNOSIS — K439 Ventral hernia without obstruction or gangrene: Secondary | ICD-10-CM | POA: Diagnosis not present

## 2015-07-14 DIAGNOSIS — R001 Bradycardia, unspecified: Secondary | ICD-10-CM | POA: Diagnosis not present

## 2015-07-14 DIAGNOSIS — K429 Umbilical hernia without obstruction or gangrene: Secondary | ICD-10-CM | POA: Diagnosis not present

## 2015-07-14 DIAGNOSIS — M7121 Synovial cyst of popliteal space [Baker], right knee: Secondary | ICD-10-CM | POA: Diagnosis not present

## 2015-07-14 DIAGNOSIS — L749 Eccrine sweat disorder, unspecified: Secondary | ICD-10-CM | POA: Diagnosis not present

## 2015-07-14 DIAGNOSIS — Z01811 Encounter for preprocedural respiratory examination: Secondary | ICD-10-CM | POA: Diagnosis not present

## 2015-07-14 DIAGNOSIS — Z0183 Encounter for blood typing: Secondary | ICD-10-CM | POA: Diagnosis not present

## 2015-07-22 DIAGNOSIS — Z9884 Bariatric surgery status: Secondary | ICD-10-CM | POA: Diagnosis not present

## 2015-07-22 DIAGNOSIS — K429 Umbilical hernia without obstruction or gangrene: Secondary | ICD-10-CM | POA: Diagnosis not present

## 2015-07-22 DIAGNOSIS — Z8249 Family history of ischemic heart disease and other diseases of the circulatory system: Secondary | ICD-10-CM | POA: Diagnosis not present

## 2015-07-22 DIAGNOSIS — M1990 Unspecified osteoarthritis, unspecified site: Secondary | ICD-10-CM | POA: Diagnosis present

## 2015-07-22 DIAGNOSIS — Z96653 Presence of artificial knee joint, bilateral: Secondary | ICD-10-CM | POA: Diagnosis present

## 2015-07-22 DIAGNOSIS — D649 Anemia, unspecified: Secondary | ICD-10-CM | POA: Diagnosis present

## 2015-07-22 DIAGNOSIS — K439 Ventral hernia without obstruction or gangrene: Secondary | ICD-10-CM | POA: Diagnosis not present

## 2015-07-22 DIAGNOSIS — L732 Hidradenitis suppurativa: Secondary | ICD-10-CM | POA: Diagnosis not present

## 2015-07-22 DIAGNOSIS — M793 Panniculitis, unspecified: Secondary | ICD-10-CM | POA: Diagnosis present

## 2015-07-22 DIAGNOSIS — F17290 Nicotine dependence, other tobacco product, uncomplicated: Secondary | ICD-10-CM | POA: Diagnosis present

## 2015-07-22 DIAGNOSIS — Z833 Family history of diabetes mellitus: Secondary | ICD-10-CM | POA: Diagnosis not present

## 2015-08-04 ENCOUNTER — Ambulatory Visit (HOSPITAL_BASED_OUTPATIENT_CLINIC_OR_DEPARTMENT_OTHER)
Admission: RE | Admit: 2015-08-04 | Discharge: 2015-08-04 | Disposition: A | Discharge status: Home / Self Care | Payer: Medicare Other | Source: Ambulatory Visit | Attending: Family Medicine | Admitting: Family Medicine

## 2015-08-04 ENCOUNTER — Encounter: Payer: Self-pay | Admitting: Family Medicine

## 2015-08-04 ENCOUNTER — Ambulatory Visit (INDEPENDENT_AMBULATORY_CARE_PROVIDER_SITE_OTHER): Payer: Medicare Other | Admitting: Family Medicine

## 2015-08-04 VITALS — BP 121/68 | HR 84 | Wt 215.0 lb

## 2015-08-04 DIAGNOSIS — D62 Acute posthemorrhagic anemia: Secondary | ICD-10-CM | POA: Diagnosis not present

## 2015-08-04 DIAGNOSIS — R600 Localized edema: Secondary | ICD-10-CM

## 2015-08-04 DIAGNOSIS — R609 Edema, unspecified: Secondary | ICD-10-CM | POA: Diagnosis not present

## 2015-08-04 LAB — COMPLETE METABOLIC PANEL WITH GFR
ALT: 6 U/L — ABNORMAL LOW (ref 9–46)
AST: 15 U/L (ref 10–40)
Albumin: 3.3 g/dL — ABNORMAL LOW (ref 3.6–5.1)
Alkaline Phosphatase: 71 U/L (ref 40–115)
BUN: 16 mg/dL (ref 7–25)
CALCIUM: 8.6 mg/dL (ref 8.6–10.3)
CHLORIDE: 105 mmol/L (ref 98–110)
CO2: 26 mmol/L (ref 20–31)
Creat: 0.64 mg/dL (ref 0.60–1.35)
Glucose, Bld: 111 mg/dL — ABNORMAL HIGH (ref 65–99)
POTASSIUM: 3.8 mmol/L (ref 3.5–5.3)
Sodium: 137 mmol/L (ref 135–146)
Total Bilirubin: 0.7 mg/dL (ref 0.2–1.2)
Total Protein: 6.4 g/dL (ref 6.1–8.1)

## 2015-08-04 LAB — CBC
HCT: 30.5 % — ABNORMAL LOW (ref 39.0–52.0)
Hemoglobin: 9.9 g/dL — ABNORMAL LOW (ref 13.0–17.0)
MCH: 26.4 pg (ref 26.0–34.0)
MCHC: 32.5 g/dL (ref 30.0–36.0)
MCV: 81.3 fL (ref 78.0–100.0)
MPV: 8.2 fL — AB (ref 8.6–12.4)
PLATELETS: 402 10*3/uL — AB (ref 150–400)
RBC: 3.75 MIL/uL — ABNORMAL LOW (ref 4.22–5.81)
RDW: 17 % — AB (ref 11.5–15.5)
WBC: 7.3 10*3/uL (ref 4.0–10.5)

## 2015-08-04 NOTE — Progress Notes (Signed)
CC: Jordan PeacockRoger Bailey is a 49 y.o. male is here for Edema   Subjective: HPI:  Complains of right lower extremity edema moderate severity present on a daily basis for the past month. It is becoming more and more painful. He localizes the pain to the skin. It does not seem to get better or worse depending on positioning. Symptoms have not gotten better or worse ever since he had a 360 plastic surgery operation last week where excess skin was removed around his trunk. He tells me he thinks he had a venous ultrasound when [place] was at [place] procedure however he never got the results back about the ultrasound. He's had some mild swelling in the left side of his leg as well but not nearly as much as what he seen on the right side. He denies shortness of breath or orthopnea. He denies edema elsewhere. He's currently taking Torsemide 20 mg daily but it does not seem to be helping.he denies fevers, chills, confusion or any other motor or sensory disturbances.  Hemoglobin 10.2 on 07/29/15 up from 6.9 four days earlier He had his surgery. He received transfusions on 4 days appears he probably had 1 unit every day based on what he is telling me.    Review Of Systems Outlined In HPI  Past Medical History  Diagnosis Date  . Kidney stones   . Arthritis   . Iron deficiency anemia     Past Surgical History  Procedure Laterality Date  . 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 Reattached tendon Left 05/2011    LEFT LEG  . Colonoscopy    . Total knee arthroplasty Left 03/06/2015    Procedure: TOTAL KNEE ARTHROPLASTY;  Surgeon: Jodi GeraldsJohn Graves, MD;  Location: [facility] OR;  Service: Orthopedics;  Laterality: Left;  . Total knee arthroplasty Right 06/22/2015    Procedure: RIGHT TOTAL KNEE ARTHROPLASTY;  Surgeon: Jodi GeraldsJohn Graves, MD;  Location: [facility] OR;  Service: Orthopedics;  Laterality: Right;   Family History  Problem Relation Age of Onset  . Diabetes Mother     Social History    Social History  . Marital Status: Married    Spouse Name: N/A  . Number of Children: N/A  . Years of Education: N/A   Occupational History  . Not on file.   Social History Main Topics  . Smoking status: Light Tobacco Smoker -- 18 years    Types: Cigars  . Smokeless tobacco: Former NeurosurgeonUser  . Alcohol Use: 0.0 oz/week    0 Standard drinks or equivalent per week     Comment: rare  . Drug Use: No  . Sexual Activity:    Partners: Female   Other Topics Concern  . Not on file   Social History Narrative     Objective: BP 121/68 mmHg  Pulse 84  Wt 215 lb (97.523 kg)  General: Alert and Oriented, No Acute Distress HEENT: Pupils equal, round, reactive to light. Conjunctivae clear.  Moist mucous membranes pharynx unremarkable Lungs: Clear to auscultation bilaterally, no wheezing/ronchi/rales.  Comfortable work of breathing. Good air movement. Cardiac: Regular rate and rhythm. Normal S1/S2.  No murmurs, rubs, nor gallops.   Extremities:2+ mildly pitting edema in the right lower extremity from the knee distally. 1+ nonpitting edema in the left lower extremity from the mid shin distally.  Strong peripheral pulses.  Mental Status: No depression, anxiety, nor agitation. Skin: Warm and dry.  Assessment & Plan: Jordan Bailey was seen today for edema.  Diagnoses and all orders for this visit:  Leg edema, right -     CBC -     B Nat Peptide -     COMPLETE METABOLIC PANEL WITH GFR -     US Venous Img Lower Unilateral Right; Future  Acute blood loss anemia   Right-sided leg edema: Obtaining a venous ultrasound to rule out DVT, prepare him that if he does not have signs of heart failure or kidney failure with blood work we will manage this with diuretics. I called the [place] imaging office to give them my cell phone number and also added the cell phone number on the order to contact me once results are available.   Return if symptoms worsen or fail to improve.

## 2015-08-05 ENCOUNTER — Telehealth: Payer: Self-pay | Admitting: Family Medicine

## 2015-08-05 LAB — BRAIN NATRIURETIC PEPTIDE: BRAIN NATRIURETIC PEPTIDE: 41.6 pg/mL (ref 0.0–100.0)

## 2015-08-05 MED ORDER — TORSEMIDE 20 MG PO TABS
20.0000 mg | ORAL_TABLET | Freq: Two times a day (BID) | ORAL | Status: DC | PRN
Start: 2015-08-05 — End: 2015-12-22

## 2015-08-05 NOTE — Telephone Encounter (Signed)
Will you please let patient know that his kidney function is perfect.  I'm still waiting on the test that checks for heart failure but he does have a few findings that could be contributing to his edema.  Both is albumin and hemoglobin levels are low and when these proteins are low it can allow edema to form.  This can be improved with adding a approximate 20g whey protein shake daily and increasing his iron supplement to twice a day.  Can he spell out the name of his new water pill and the new dose? I want to make an adjustment to it.

## 2015-08-05 NOTE — Telephone Encounter (Signed)
Pt advised. Spironolactone 25 mg BID is the lasix that he is taking.

## 2015-08-05 NOTE — Telephone Encounter (Signed)
I sent a much stronger "water pill" to his [facility] [place], it's called torsemide. Try that out for a week and let me know how it's going.

## 2015-08-06 NOTE — Telephone Encounter (Signed)
I can write a letter stating that his wife is his min care taker however if he's not hospitalized or in a nursing facility I can't take any position on court dates, this would need to be taken care of by a lawyer. Note in your in box.

## 2015-08-06 NOTE — Telephone Encounter (Signed)
Pt understood and asks could the letter be detailed about his?

## 2015-08-06 NOTE — Telephone Encounter (Signed)
Pt advised.   Pt is asking can you write a letter to the courts [place] [facility] stating that his wife is main care taker and that the court date they needs to be postpone due to his medical issues at this point in time.

## 2015-08-07 NOTE — Telephone Encounter (Signed)
I'm not sure what you mean, can you please clarify?

## 2015-08-07 NOTE — Telephone Encounter (Signed)
Sorry he would like for the letter to be detailed about his health issues.

## 2015-08-14 ENCOUNTER — Encounter: Payer: Self-pay | Admitting: Family Medicine

## 2015-08-14 ENCOUNTER — Ambulatory Visit (INDEPENDENT_AMBULATORY_CARE_PROVIDER_SITE_OTHER): Payer: Medicare Other | Admitting: Family Medicine

## 2015-08-14 ENCOUNTER — Telehealth: Payer: Self-pay

## 2015-08-14 VITALS — BP 119/76 | HR 90 | Wt 186.0 lb

## 2015-08-14 DIAGNOSIS — M6250 Muscle wasting and atrophy, not elsewhere classified, unspecified site: Secondary | ICD-10-CM

## 2015-08-14 DIAGNOSIS — R58 Hemorrhage, not elsewhere classified: Secondary | ICD-10-CM

## 2015-08-14 DIAGNOSIS — D649 Anemia, unspecified: Secondary | ICD-10-CM

## 2015-08-14 LAB — HEMOGLOBIN: HEMOGLOBIN: 11.6 g/dL — AB (ref 13.0–17.0)

## 2015-08-14 NOTE — Progress Notes (Signed)
CC: Jordan Bailey is a 49 y.o. male is here for Wound Check   Subjective: HPI:  Complains of bleeding from the midline of the back that has been getting more noticeable over the past 2-3 days. Symptoms started after he bent forward and felt a pulling sensation where the bleeding is occurring. He is unable to view the location of the bleeding. His covering it with sterile gauze. He denies fevers, chills, nor nausea.  Complains of shrinking bicep muscles over the past 3-4 months. He denies any new weakness. He denies any other motor or sensory disturbances. Denies muscle atrophy elsewhere or coordination difficulty.   Review Of Systems Outlined In HPI  Past Medical History  Diagnosis Date  . Kidney stones   . Arthritis   . Iron deficiency anemia     Past Surgical History  Procedure Laterality Date  . 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 Reattached tendon Left 05/2011    LEFT LEG  . Colonoscopy    . 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 Total knee arthroplasty Right 06/22/2015    Procedure: RIGHT TOTAL KNEE ARTHROPLASTY;  Surgeon: Jodi Geralds, MD;  Location: [facility] OR;  Service: Orthopedics;  Laterality: Right;   Family History  Problem Relation Age of Onset  . Diabetes Mother     Social History   Social History  . Marital Status: Married    Spouse Name: N/A  . Number of Children: N/A  . Years of Education: N/A   Occupational History  . Not on file.   Social History Main Topics  . Smoking status: Light Tobacco Smoker -- 18 years    Types: Cigars  . Smokeless tobacco: Former Neurosurgeon  . Alcohol Use: 0.0 oz/week    0 Standard drinks or equivalent per week     Comment: rare  . Drug Use: No  . Sexual Activity:    Partners: Female   Other Topics Concern  . Not on file   Social History Narrative     Objective: BP 119/76 mmHg   Pulse 90  Wt 186 lb (84.369 kg)  Vital signs reviewed. General: Alert and Oriented, No Acute Distress HEENT: Pupils equal, round, reactive to light. Conjunctivae clear.  External ears unremarkable.  Moist mucous membranes. Lungs: Clear and comfortable work of breathing, speaking in full sentences without accessory muscle use. Cardiac: Regular rate and rhythm.  Neuro: CN II-XII grossly intact, gait normal. Extremities: No peripheral edema.  Strong peripheral pulses. Full strength of the upper extremities  Mental Status: No depression, anxiety, nor agitation. Logical though process. Skin: Warm and dry. His surgical incisions appeared normal other than a 4 point intersection in the midline of the low back where there is a half centimeter gap in the skin with no signs of infection or active bleeding.  Assessment & Plan: Jordan Bailey was seen today for wound check.  Diagnoses and all orders for this visit:  Muscle atrophy -     Testosterone, Free, Total, SHBG  Anemia, unspecified anemia type -     Hemoglobin  Bleeding   muscle atrophy: Rule out hypogonadism Bleeding is most likely due to a very mild tear of his surgical incision in the midline of the back, Steri-Strips were used today for better approximation of this intersection. I also drew him a picture to share with his son said that his son can  replace the Steri-Strips until the wound is fully healed. Follow up with his surgeon as previously directed Anemia: Since rechecking blood work for hypogonadism will check hemoglobin today, he is clinically asymptomatic.  25 minutes spent face-to-face during visit today of which at least 50% was counseling or coordinating care regarding: 1. Muscle atrophy   2. Anemia, unspecified anemia type   3. Bleeding        Return in about 1 week (around 08/21/2015) for check drains.

## 2015-08-14 NOTE — Telephone Encounter (Signed)
Pt reports that Demadex is working.

## 2015-08-15 ENCOUNTER — Emergency Department (INDEPENDENT_AMBULATORY_CARE_PROVIDER_SITE_OTHER)
Admission: EM | Admit: 2015-08-15 | Discharge: 2015-08-15 | Disposition: A | Discharge status: Home / Self Care | Payer: Medicare Other | Source: Home / Self Care | Attending: Family Medicine | Admitting: Family Medicine

## 2015-08-15 ENCOUNTER — Encounter: Payer: Self-pay | Admitting: Emergency Medicine

## 2015-08-15 DIAGNOSIS — Z4801 Encounter for change or removal of surgical wound dressing: Secondary | ICD-10-CM

## 2015-08-15 NOTE — ED Notes (Signed)
States had surgery on back skin tissue 07-22-15; area has been draining more than he is comfortable with; was evaluated by Dr.Hommel yesterday and steri strips replaced and site dressed and wrapped with ace wrap; new drainage covers about 8 inch path that is 4 inches wide along spinal area. Denies pain.

## 2015-08-15 NOTE — ED Provider Notes (Signed)
CSN: [id number]     Arrival date & time 08/15/15  1422 History   First MD Initiated Contact with Patient 08/15/15 1449     Chief Complaint  Patient presents with  . Wound Check      HPI Comments: Patient is s/p laparoscopic gastric sleeve resection in 2014 that resulted in massive weight loss.  He underwent body contouring surgery (abdominoplasty) 3.5 weeks ago to remove excess abdominal skin. He is concerned about persistent bleeding from open surgical wound on his back.  He denies pain or fevers, chills, and sweats. He states that as a result of his gastric surgery, eating takes a significant amount of time.  He states that he takes a daily multiple vitamin  The history is provided by the patient.    Past Medical History  Diagnosis Date  . Kidney stones   . Arthritis   . Iron deficiency anemia    Past Surgical History  Procedure Laterality Date  . 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 Reattached tendon Left 05/2011    LEFT LEG  . Colonoscopy    . Total knee arthroplasty Left 03/06/2015    Procedure: TOTAL KNEE ARTHROPLASTY;  Surgeon: Jodi GeraldsJohn Graves, MD;  Location: [facility] OR;  Service: Orthopedics;  Laterality: Left;  . Total knee arthroplasty Right 06/22/2015    Procedure: RIGHT TOTAL KNEE ARTHROPLASTY;  Surgeon: Jodi GeraldsJohn Graves, MD;  Location: [facility] OR;  Service: Orthopedics;  Laterality: Right;  . Cosmetic surgery      360 degree he had a 360 plastic surgery operation last week where excess skin was removed around his trunkhe had a 360 plastic surgery operation  excess skin was removed around his trunk   Family History  Problem Relation Age of Onset  . Diabetes Mother    Social History  Substance Use Topics  . Smoking status: Light Tobacco Smoker -- 18 years    Types: Cigars  . Smokeless tobacco: Former NeurosurgeonUser  . Alcohol Use: 0.0 oz/week    0 Standard drinks or equivalent per week     Comment: rare    Review of Systems  Constitutional: Negative for fever, chills,  diaphoresis, appetite change and fatigue.  HENT: Negative.   Eyes: Negative.   Respiratory: Negative.   Cardiovascular: Negative.   Endocrine: Negative.   Genitourinary: Negative.   Musculoskeletal: Negative.   Skin: Positive for wound.    Allergies  Review of patient's allergies indicates no known allergies.  Home Medications   Prior to Admission medications   Medication Sig Start Date End Date Taking? Authorizing Provider  docusate sodium (COLACE) 100 MG capsule Take 100 mg by mouth daily.    Historical Provider, MD  ferrous sulfate 325 (65 FE) MG EC tablet Take 1 tablet (325 mg total) by mouth 3 (three) times daily with meals. 01/12/15   Sean Hommel, DO  furosemide (LASIX) 20 MG tablet Take 10 mg by mouth daily.    Historical Provider, MD  methocarbamol (ROBAXIN-750) 750 MG tablet Take 1 tablet (750 mg total) by mouth every 8 (eight) hours as needed for muscle spasms. 06/22/15   Marshia LyJames Bethune, PA-C  Multiple Vitamins-Minerals (MULTIVITAMIN WITH MINERALS) tablet Take 1 tablet by mouth daily.    Historical Provider, MD  torsemide (DEMADEX) 20 MG tablet Take 1 tablet (20 mg total) by mouth 2 (two) times daily as needed (swelling). 08/05/15   Laren BoomSean Hommel, DO   Meds Ordered and Administered this Visit  Medications - No data to display  BP 111/70 mmHg  Pulse 63  Temp(Src) 97.4 F (36.3 C) (Oral)  Resp 16  SpO2 96% No data found.   Physical Exam  Constitutional: He is oriented to person, place, and time. No distress.  Patient appears somewhat cachectic  HENT:  Head: Normocephalic.  Eyes: Conjunctivae are normal. Pupils are equal, round, and reactive to light.  Neck: Neck supple. No thyromegaly present.  Cardiovascular: Normal heart sounds.   Pulmonary/Chest: Breath sounds normal.  Abdominal: Bowel sounds are normal. There is no tenderness.  Musculoskeletal: He exhibits no edema.  Lymphadenopathy:    He has no cervical adenopathy.  Neurological: He is alert and oriented to  person, place, and time.  Skin: Skin is warm and dry. No rash noted. He is not diaphoretic.     Patient has a well healed circumferential abdominal surgical wound still covered with Xeroform gauze,  as noted on diagram.  Removed all remaining Xeroform gauze dressing. On the lower back, as noted on diagram, is a cross-shaped shallow surgical wound that remains open, but without evidence of cellulitis.    Nursing note and vitals reviewed.   ED Course  Procedures  Cleansed open wound on back with saline.  Applied SteriStrip closure strips, followed by Xeroform gauze and absorbant dressing.  MDM   1. Dressing change or removal, surgical wound    No evidence surgical wound cellulitis.   Keep surgical wound on back bandaged until healed.  Change bandage daily Patient is at risk for significant malabsorption and dietary vitamin/trace element deficits.  Recommend consult with dietitian. Recommend checking vitamin D level..  Recommend beginning a calcium and vitamin D supplement. Followup with PCP as scheduled.       Lattie Haw, MD 08/18/15 [phone]

## 2015-08-17 ENCOUNTER — Telehealth: Payer: Self-pay

## 2015-08-17 DIAGNOSIS — E291 Testicular hypofunction: Secondary | ICD-10-CM | POA: Insufficient documentation

## 2015-08-17 DIAGNOSIS — T8131XA Disruption of external operation (surgical) wound, not elsewhere classified, initial encounter: Secondary | ICD-10-CM

## 2015-08-17 LAB — TESTOSTERONE, FREE, TOTAL, SHBG
Sex Hormone Binding: 39 nmol/L (ref 10–50)
TESTOSTERONE-% FREE: 1.7 % (ref 1.6–2.9)
TESTOSTERONE: 218 ng/dL — AB (ref 300–890)
Testosterone, Free: 37.6 pg/mL — ABNORMAL LOW (ref 47.0–244.0)

## 2015-08-17 NOTE — Telephone Encounter (Signed)
Pt wants to know will it be safe for him to start the testosterone treat or should he wait til he gets better.

## 2015-08-17 NOTE — Telephone Encounter (Signed)
Will you please let patient know that his testosterone level was in the deficient range but his hemoglobin is improving.  If he's interested in testosterone replacement therapy he'll need to have a repeat testosterone level and a PSA prostate blood test next week, this would be a lab only visit if he's interested.    Also the more I think about his wound I think he should visit with a plastic surgeon to help him speed up healing.  I'll place a referral for this right now. I certainly don't have the experience to fix where it's open.

## 2015-08-17 NOTE — Telephone Encounter (Signed)
Pt reports that the open incision continued to bleed and he went to urgent care for them to stop the bleeding.  He wants to know can you possibly close that open incision?  He also reports that there is little drainage in tube 1 and none in tube 2.  Pt is out of Cipro.  Is he suppose to continue taking this medication?

## 2015-08-18 NOTE — Telephone Encounter (Signed)
Pt notified.    He would like to know is he a good candidate for iron infusion?

## 2015-08-18 NOTE — Discharge Instructions (Signed)
Keep surgical wound on back bandaged until healed.  Change bandage daily.  Recommend calcium and vitamin D supplement

## 2015-08-18 NOTE — Telephone Encounter (Signed)
Yes it would be safe, it will not interfere with the healing process.

## 2015-08-18 NOTE — Telephone Encounter (Signed)
Iron infusions are only used if an individual's hemoglobin level does not rise despite taking iron supplements.  Fortunately his bone marrow seems to be happy with his iron supplement and is successfully using it to make his hemoglobin rise.  He is not a candidate, which is a good thing.

## 2015-08-19 NOTE — Telephone Encounter (Signed)
Pt.notified

## 2015-08-21 DIAGNOSIS — S21209A Unspecified open wound of unspecified back wall of thorax without penetration into thoracic cavity, initial encounter: Secondary | ICD-10-CM | POA: Diagnosis not present

## 2015-08-21 DIAGNOSIS — T8189XA Other complications of procedures, not elsewhere classified, initial encounter: Secondary | ICD-10-CM | POA: Diagnosis not present

## 2015-08-24 ENCOUNTER — Encounter: Payer: Self-pay | Admitting: Family Medicine

## 2015-08-24 DIAGNOSIS — T8131XD Disruption of external operation (surgical) wound, not elsewhere classified, subsequent encounter: Secondary | ICD-10-CM | POA: Diagnosis not present

## 2015-08-24 DIAGNOSIS — T8130XA Disruption of wound, unspecified, initial encounter: Secondary | ICD-10-CM | POA: Insufficient documentation

## 2015-08-24 DIAGNOSIS — S21209D Unspecified open wound of unspecified back wall of thorax without penetration into thoracic cavity, subsequent encounter: Secondary | ICD-10-CM | POA: Diagnosis not present

## 2015-08-25 DIAGNOSIS — S21209D Unspecified open wound of unspecified back wall of thorax without penetration into thoracic cavity, subsequent encounter: Secondary | ICD-10-CM | POA: Diagnosis not present

## 2015-08-25 DIAGNOSIS — T8131XD Disruption of external operation (surgical) wound, not elsewhere classified, subsequent encounter: Secondary | ICD-10-CM | POA: Diagnosis not present

## 2015-08-31 DIAGNOSIS — S21209D Unspecified open wound of unspecified back wall of thorax without penetration into thoracic cavity, subsequent encounter: Secondary | ICD-10-CM | POA: Diagnosis not present

## 2015-08-31 DIAGNOSIS — T8131XD Disruption of external operation (surgical) wound, not elsewhere classified, subsequent encounter: Secondary | ICD-10-CM | POA: Diagnosis not present

## 2015-09-03 ENCOUNTER — Telehealth: Payer: Self-pay

## 2015-09-03 DIAGNOSIS — D649 Anemia, unspecified: Secondary | ICD-10-CM

## 2015-09-03 NOTE — Telephone Encounter (Signed)
Pt would like for his hemoglobin levels to be checked.

## 2015-09-04 DIAGNOSIS — S21209D Unspecified open wound of unspecified back wall of thorax without penetration into thoracic cavity, subsequent encounter: Secondary | ICD-10-CM | POA: Diagnosis not present

## 2015-09-04 NOTE — Telephone Encounter (Signed)
Pt.notified

## 2015-09-04 NOTE — Telephone Encounter (Signed)
Lab slip in your in box 

## 2015-09-07 DIAGNOSIS — T8131XD Disruption of external operation (surgical) wound, not elsewhere classified, subsequent encounter: Secondary | ICD-10-CM | POA: Diagnosis not present

## 2015-09-07 DIAGNOSIS — S21209D Unspecified open wound of unspecified back wall of thorax without penetration into thoracic cavity, subsequent encounter: Secondary | ICD-10-CM | POA: Diagnosis not present

## 2015-09-09 DIAGNOSIS — D649 Anemia, unspecified: Secondary | ICD-10-CM | POA: Diagnosis not present

## 2015-09-09 LAB — CBC
HCT: 35.6 % — ABNORMAL LOW (ref 39.0–52.0)
Hemoglobin: 11.2 g/dL — ABNORMAL LOW (ref 13.0–17.0)
MCH: 24.9 pg — AB (ref 26.0–34.0)
MCHC: 31.5 g/dL (ref 30.0–36.0)
MCV: 79.1 fL (ref 78.0–100.0)
MPV: 9 fL (ref 8.6–12.4)
Platelets: 274 10*3/uL (ref 150–400)
RBC: 4.5 MIL/uL (ref 4.22–5.81)
RDW: 16.2 % — AB (ref 11.5–15.5)
WBC: 5.5 10*3/uL (ref 4.0–10.5)

## 2015-09-14 ENCOUNTER — Telehealth: Payer: Self-pay

## 2015-09-14 DIAGNOSIS — R739 Hyperglycemia, unspecified: Secondary | ICD-10-CM

## 2015-09-14 NOTE — Telephone Encounter (Signed)
Lab slip in your in box 

## 2015-09-14 NOTE — Telephone Encounter (Signed)
Pt.notified

## 2015-09-14 NOTE — Telephone Encounter (Signed)
Pt would like for CMP to be drawn.

## 2015-09-15 ENCOUNTER — Telehealth: Payer: Self-pay

## 2015-09-15 DIAGNOSIS — D508 Other iron deficiency anemias: Secondary | ICD-10-CM

## 2015-09-15 DIAGNOSIS — S21209D Unspecified open wound of unspecified back wall of thorax without penetration into thoracic cavity, subsequent encounter: Secondary | ICD-10-CM | POA: Diagnosis not present

## 2015-09-15 MED ORDER — FERROUS SULFATE 325 (65 FE) MG PO TBEC: 325.0000 mg | DELAYED_RELEASE_TABLET | Freq: Three times a day (TID) | ORAL | Status: DC

## 2015-09-15 NOTE — Telephone Encounter (Signed)
[place], Rx placed in in-box ready for pickup/faxing.  

## 2015-09-15 NOTE — Telephone Encounter (Signed)
Do you know exactly how much iron [name] should be taking?

## 2015-09-15 NOTE — Telephone Encounter (Signed)
Pt asked could a iron prescription be written for him?

## 2015-09-15 NOTE — Telephone Encounter (Signed)
Pt.notified

## 2015-09-15 NOTE — Telephone Encounter (Signed)
Rx faxed

## 2015-09-15 NOTE — Telephone Encounter (Signed)
Exactly 325mg  three times a day with meals. If it causes constipation cut back to just twice a day with meals.

## 2015-09-17 ENCOUNTER — Other Ambulatory Visit: Payer: Self-pay

## 2015-09-17 DIAGNOSIS — E669 Obesity, unspecified: Secondary | ICD-10-CM

## 2015-09-17 DIAGNOSIS — R7301 Impaired fasting glucose: Secondary | ICD-10-CM | POA: Diagnosis not present

## 2015-09-18 LAB — PREALBUMIN: PREALBUMIN: 21 mg/dL (ref 21–43)

## 2015-09-19 LAB — HEMOGLOBIN A1C
Hgb A1c MFr Bld: 5.2 % (ref <5.7)
MEAN PLASMA GLUCOSE: 103 mg/dL (ref <117)

## 2015-09-22 DIAGNOSIS — S21209D Unspecified open wound of unspecified back wall of thorax without penetration into thoracic cavity, subsequent encounter: Secondary | ICD-10-CM | POA: Diagnosis not present

## 2015-09-29 ENCOUNTER — Encounter (HOSPITAL_BASED_OUTPATIENT_CLINIC_OR_DEPARTMENT_OTHER): Payer: Self-pay | Admitting: *Deleted

## 2015-10-01 ENCOUNTER — Encounter (HOSPITAL_BASED_OUTPATIENT_CLINIC_OR_DEPARTMENT_OTHER)
Admission: RE | Admit: 2015-10-01 | Discharge: 2015-10-01 | Disposition: A | Discharge status: Home / Self Care | Payer: Medicare Other | Source: Ambulatory Visit | Attending: Plastic Surgery | Admitting: Plastic Surgery

## 2015-10-01 DIAGNOSIS — Z01818 Encounter for other preprocedural examination: Secondary | ICD-10-CM | POA: Insufficient documentation

## 2015-10-01 LAB — BASIC METABOLIC PANEL WITH GFR
Anion gap: 10 (ref 5–15)
BUN: 19 mg/dL (ref 6–20)
CO2: 29 mmol/L (ref 22–32)
Calcium: 9.5 mg/dL (ref 8.9–10.3)
Chloride: 103 mmol/L (ref 101–111)
Creatinine, Ser: 0.84 mg/dL (ref 0.61–1.24)
GFR calc Af Amer: >60 mL/min
GFR calc non Af Amer: >60 mL/min
Glucose, Bld: 74 mg/dL (ref 65–99)
Potassium: 3.5 mmol/L (ref 3.5–5.1)
Sodium: 142 mmol/L (ref 135–145)

## 2015-10-06 ENCOUNTER — Ambulatory Visit: Payer: Self-pay | Admitting: Physician Assistant

## 2015-10-07 ENCOUNTER — Encounter (HOSPITAL_BASED_OUTPATIENT_CLINIC_OR_DEPARTMENT_OTHER): Payer: Self-pay | Admitting: Plastic Surgery

## 2015-10-07 ENCOUNTER — Ambulatory Visit (HOSPITAL_BASED_OUTPATIENT_CLINIC_OR_DEPARTMENT_OTHER): Payer: Medicare Other | Admitting: Anesthesiology

## 2015-10-07 ENCOUNTER — Encounter (HOSPITAL_BASED_OUTPATIENT_CLINIC_OR_DEPARTMENT_OTHER)
Admission: RE | Disposition: A | Discharge status: Home / Self Care | Payer: Self-pay | Source: Ambulatory Visit | Attending: Plastic Surgery

## 2015-10-07 ENCOUNTER — Ambulatory Visit (HOSPITAL_BASED_OUTPATIENT_CLINIC_OR_DEPARTMENT_OTHER)
Admission: RE | Admit: 2015-10-07 | Discharge: 2015-10-07 | Disposition: A | Discharge status: Home / Self Care | Payer: Medicare Other | Source: Ambulatory Visit | Attending: Plastic Surgery | Admitting: Plastic Surgery

## 2015-10-07 DIAGNOSIS — Y838 Other surgical procedures as the cause of abnormal reaction of the patient, or of later complication, without mention of misadventure at the time of the procedure: Secondary | ICD-10-CM | POA: Insufficient documentation

## 2015-10-07 DIAGNOSIS — F17200 Nicotine dependence, unspecified, uncomplicated: Secondary | ICD-10-CM | POA: Insufficient documentation

## 2015-10-07 DIAGNOSIS — T8189XA Other complications of procedures, not elsewhere classified, initial encounter: Secondary | ICD-10-CM | POA: Insufficient documentation

## 2015-10-07 DIAGNOSIS — Z9884 Bariatric surgery status: Secondary | ICD-10-CM | POA: Insufficient documentation

## 2015-10-07 DIAGNOSIS — S21209D Unspecified open wound of unspecified back wall of thorax without penetration into thoracic cavity, subsequent encounter: Secondary | ICD-10-CM | POA: Diagnosis not present

## 2015-10-07 DIAGNOSIS — M1990 Unspecified osteoarthritis, unspecified site: Secondary | ICD-10-CM | POA: Diagnosis not present

## 2015-10-07 DIAGNOSIS — Z96653 Presence of artificial knee joint, bilateral: Secondary | ICD-10-CM | POA: Diagnosis not present

## 2015-10-07 DIAGNOSIS — S21201A Unspecified open wound of right back wall of thorax without penetration into thoracic cavity, initial encounter: Secondary | ICD-10-CM | POA: Diagnosis not present

## 2015-10-07 HISTORY — PX: INCISION AND DRAINAGE OF WOUND: SHX1803

## 2015-10-07 HISTORY — PX: APPLICATION OF A-CELL OF BACK: SHX6301

## 2015-10-07 LAB — POCT HEMOGLOBIN-HEMACUE: HEMOGLOBIN: 12.6 g/dL — AB (ref 13.0–17.0)

## 2015-10-07 SURGERY — IRRIGATION AND DEBRIDEMENT WOUND
Anesthesia: General | Site: Back

## 2015-10-07 MED ORDER — PROPOFOL 10 MG/ML IV BOLUS
INTRAVENOUS | Status: AC
  Filled 2015-10-07: qty 20

## 2015-10-07 MED ORDER — BUPIVACAINE-EPINEPHRINE (PF) 0.25% -1:200000 IJ SOLN
INTRAMUSCULAR | Status: DC | PRN
  Administered 2015-10-07: 3 mL

## 2015-10-07 MED ORDER — SCOPOLAMINE 1 MG/3DAYS TD PT72: 1.0000 | MEDICATED_PATCH | Freq: Once | TRANSDERMAL | Status: DC | PRN

## 2015-10-07 MED ORDER — CEFAZOLIN SODIUM-DEXTROSE 2-3 GM-% IV SOLR
INTRAVENOUS | Status: AC
  Filled 2015-10-07: qty 50

## 2015-10-07 MED ORDER — DEXAMETHASONE SODIUM PHOSPHATE 4 MG/ML IJ SOLN
INTRAMUSCULAR | Status: DC | PRN
  Administered 2015-10-07: 10 mg via INTRAVENOUS

## 2015-10-07 MED ORDER — MIDAZOLAM HCL 2 MG/2ML IJ SOLN
INTRAMUSCULAR | Status: AC
  Filled 2015-10-07: qty 2

## 2015-10-07 MED ORDER — FENTANYL CITRATE (PF) 100 MCG/2ML IJ SOLN
INTRAMUSCULAR | Status: AC
  Filled 2015-10-07: qty 2

## 2015-10-07 MED ORDER — SUCCINYLCHOLINE CHLORIDE 20 MG/ML IJ SOLN
INTRAMUSCULAR | Status: DC | PRN
  Administered 2015-10-07: 100 mg via INTRAVENOUS

## 2015-10-07 MED ORDER — PROPOFOL 10 MG/ML IV BOLUS
INTRAVENOUS | Status: DC | PRN
  Administered 2015-10-07: 200 mg via INTRAVENOUS

## 2015-10-07 MED ORDER — FENTANYL CITRATE (PF) 100 MCG/2ML IJ SOLN
50.0000 ug | INTRAMUSCULAR | Status: DC | PRN
  Administered 2015-10-07: 100 ug via INTRAVENOUS

## 2015-10-07 MED ORDER — SODIUM CHLORIDE 0.9 % IR SOLN
Status: DC | PRN
  Administered 2015-10-07: 500 mL

## 2015-10-07 MED ORDER — GLYCOPYRROLATE 0.2 MG/ML IJ SOLN: 0.2000 mg | Freq: Once | INTRAMUSCULAR | Status: DC | PRN

## 2015-10-07 MED ORDER — ONDANSETRON HCL 4 MG/2ML IJ SOLN: 4.0000 mg | Freq: Four times a day (QID) | INTRAMUSCULAR | Status: DC | PRN

## 2015-10-07 MED ORDER — OXYCODONE HCL 5 MG/5ML PO SOLN: 5.0000 mg | Freq: Once | ORAL | Status: DC | PRN

## 2015-10-07 MED ORDER — HYDROMORPHONE HCL 1 MG/ML IJ SOLN: 0.2500 mg | INTRAMUSCULAR | Status: DC | PRN

## 2015-10-07 MED ORDER — DEXAMETHASONE SODIUM PHOSPHATE 10 MG/ML IJ SOLN
INTRAMUSCULAR | Status: AC
  Filled 2015-10-07: qty 1

## 2015-10-07 MED ORDER — LIDOCAINE HCL (CARDIAC) 20 MG/ML IV SOLN
INTRAVENOUS | Status: DC | PRN
  Administered 2015-10-07: 60 mg via INTRAVENOUS

## 2015-10-07 MED ORDER — OXYCODONE HCL 5 MG PO TABS: 5.0000 mg | ORAL_TABLET | Freq: Once | ORAL | Status: DC | PRN

## 2015-10-07 MED ORDER — LACTATED RINGERS IV SOLN
INTRAVENOUS | Status: DC
  Administered 2015-10-07: 13:00:00 via INTRAVENOUS

## 2015-10-07 MED ORDER — CEFAZOLIN SODIUM-DEXTROSE 2-3 GM-% IV SOLR
2.0000 g | INTRAVENOUS | Status: AC
  Administered 2015-10-07: 2 g via INTRAVENOUS

## 2015-10-07 MED ORDER — ONDANSETRON HCL 4 MG/2ML IJ SOLN
INTRAMUSCULAR | Status: DC | PRN
  Administered 2015-10-07: 4 mg via INTRAVENOUS

## 2015-10-07 MED ORDER — MIDAZOLAM HCL 2 MG/2ML IJ SOLN
1.0000 mg | INTRAMUSCULAR | Status: DC | PRN
  Administered 2015-10-07: 2 mg via INTRAVENOUS

## 2015-10-07 SURGICAL SUPPLY — 72 items
BAG DECANTER FOR FLEXI CONT (MISCELLANEOUS) IMPLANT
BENZOIN TINCTURE PRP APPL 2/3 (GAUZE/BANDAGES/DRESSINGS) IMPLANT
BLADE HEX COATED 2.75 (ELECTRODE) IMPLANT
BLADE SURG 10 STRL SS (BLADE) IMPLANT
BLADE SURG 15 STRL LF DISP TIS (BLADE) ×1 IMPLANT
BLADE SURG 15 STRL SS (BLADE) ×1
CANISTER SUCT 1200ML W/VALVE (MISCELLANEOUS) IMPLANT
CHLORAPREP W/TINT 26ML (MISCELLANEOUS) IMPLANT
COVER BACK TABLE 60X90IN (DRAPES) ×2 IMPLANT
COVER MAYO STAND STRL (DRAPES) ×2 IMPLANT
DECANTER SPIKE VIAL GLASS SM (MISCELLANEOUS) IMPLANT
DRAIN CHANNEL 19F RND (DRAIN) IMPLANT
DRAIN PENROSE 1/2X12 LTX STRL (WOUND CARE) IMPLANT
DRAPE INCISE IOBAN 66X45 STRL (DRAPES) ×2 IMPLANT
DRAPE LAPAROSCOPIC ABDOMINAL (DRAPES) IMPLANT
DRAPE LAPAROTOMY 100X72 PEDS (DRAPES) ×2 IMPLANT
DRSG ADAPTIC 3X8 NADH LF (GAUZE/BANDAGES/DRESSINGS) IMPLANT
DRSG EMULSION OIL 3X3 NADH (GAUZE/BANDAGES/DRESSINGS) ×2 IMPLANT
DRSG PAD ABDOMINAL 8X10 ST (GAUZE/BANDAGES/DRESSINGS) IMPLANT
ELECT REM PT RETURN 9FT ADLT (ELECTROSURGICAL) ×2
ELECTRODE REM PT RTRN 9FT ADLT (ELECTROSURGICAL) ×1 IMPLANT
EVACUATOR SILICONE 100CC (DRAIN) IMPLANT
GAUZE SPONGE 4X4 12PLY STRL (GAUZE/BANDAGES/DRESSINGS) IMPLANT
GAUZE XEROFORM 1X8 LF (GAUZE/BANDAGES/DRESSINGS) IMPLANT
GAUZE XEROFORM 5X9 LF (GAUZE/BANDAGES/DRESSINGS) IMPLANT
GLOVE BIO SURGEON STRL SZ 6.5 (GLOVE) ×4 IMPLANT
GLOVE BIOGEL M STRL SZ7.5 (GLOVE) ×2 IMPLANT
GLOVE BIOGEL PI IND STRL 7.0 (GLOVE) ×1 IMPLANT
GLOVE BIOGEL PI IND STRL 8 (GLOVE) ×1 IMPLANT
GLOVE BIOGEL PI INDICATOR 7.0 (GLOVE) ×1
GLOVE BIOGEL PI INDICATOR 8 (GLOVE) ×1
GLOVE ECLIPSE 6.5 STRL STRAW (GLOVE) ×2 IMPLANT
GLOVE EXAM NITRILE MD LF STRL (GLOVE) ×2 IMPLANT
GOWN STRL REUS W/ TWL LRG LVL3 (GOWN DISPOSABLE) ×3 IMPLANT
GOWN STRL REUS W/TWL LRG LVL3 (GOWN DISPOSABLE) ×3
GOWN STRL REUS W/TWL XL LVL3 (GOWN DISPOSABLE) ×2 IMPLANT
IV NS IRRIG 3000ML ARTHROMATIC (IV SOLUTION) IMPLANT
MANIFOLD NEPTUNE II (INSTRUMENTS) IMPLANT
MATRIX SURGICAL PSM 5X5CM (Tissue) ×2 IMPLANT
MICROMATRIX 1000MG (Tissue) ×2 IMPLANT
NEEDLE HYPO 25X1 1.5 SAFETY (NEEDLE) ×2 IMPLANT
NS IRRIG 1000ML POUR BTL (IV SOLUTION) ×2 IMPLANT
PACK BASIN DAY SURGERY FS (CUSTOM PROCEDURE TRAY) ×2 IMPLANT
PENCIL BUTTON HOLSTER BLD 10FT (ELECTRODE) ×2 IMPLANT
PIN SAFETY STERILE (MISCELLANEOUS) IMPLANT
SHEET MEDIUM DRAPE 40X70 STRL (DRAPES) IMPLANT
SLEEVE SCD COMPRESS KNEE MED (MISCELLANEOUS) ×2 IMPLANT
SOLUTION PARTIC MCRMTRX 1000MG (Tissue) ×1 IMPLANT
SPONGE GAUZE 4X4 12PLY STER LF (GAUZE/BANDAGES/DRESSINGS) IMPLANT
SPONGE LAP 18X18 X RAY DECT (DISPOSABLE) ×2 IMPLANT
STAPLER VISISTAT 35W (STAPLE) IMPLANT
STRIP CLOSURE SKIN 1/2X4 (GAUZE/BANDAGES/DRESSINGS) IMPLANT
SUCTION FRAZIER HANDLE 10FR (MISCELLANEOUS)
SUCTION TUBE FRAZIER 10FR DISP (MISCELLANEOUS) IMPLANT
SURGILUBE 2OZ TUBE FLIPTOP (MISCELLANEOUS) IMPLANT
SUT MNCRL AB 4-0 PS2 18 (SUTURE) ×2 IMPLANT
SUT MON AB 3-0 SH 27 (SUTURE)
SUT MON AB 3-0 SH27 (SUTURE) IMPLANT
SUT SILK 3 0 PS 1 (SUTURE) ×2 IMPLANT
SUT VIC AB 3-0 FS2 27 (SUTURE) IMPLANT
SUT VIC AB 5-0 PS2 18 (SUTURE) IMPLANT
SUT VICRYL 4-0 PS2 18IN ABS (SUTURE) IMPLANT
SWAB COLLECTION DEVICE MRSA (MISCELLANEOUS) IMPLANT
SWAB CULTURE ESWAB REG 1ML (MISCELLANEOUS) IMPLANT
SYR BULB IRRIGATION 50ML (SYRINGE) ×2 IMPLANT
SYR CONTROL 10ML LL (SYRINGE) ×2 IMPLANT
TAPE HYPAFIX 6X30 (GAUZE/BANDAGES/DRESSINGS) IMPLANT
TOWEL OR 17X24 6PK STRL BLUE (TOWEL DISPOSABLE) ×2 IMPLANT
TRAY DSU PREP LF (CUSTOM PROCEDURE TRAY) ×2 IMPLANT
TUBE CONNECTING 20X1/4 (TUBING) ×2 IMPLANT
UNDERPAD 30X30 (UNDERPADS AND DIAPERS) ×2 IMPLANT
YANKAUER SUCT BULB TIP NO VENT (SUCTIONS) ×2 IMPLANT

## 2015-10-07 NOTE — Discharge Instructions (Addendum)
Do not remove vac for 1 week Keep vac connected at all times  Use Tylenol or Ibuprofen for pain.  Keep dressing clean and dry. Do not get wet.  Office will call you to schedule follow-up appointment for next week.   Post Anesthesia Home Care Instructions  Activity: Get plenty of rest for the remainder of the day. A responsible adult should stay with you for 24 hours following the procedure.  For the next 24 hours, DO NOT: -Drive a car -Advertising copywriterperate machinery -Drink alcoholic beverages -Take any medication unless instructed by your physician -Make any legal decisions or sign important papers.  Meals: Start with liquid foods such as gelatin or soup. Progress to regular foods as tolerated. Avoid greasy, spicy, heavy foods. If nausea and/or vomiting occur, drink only clear liquids until the nausea and/or vomiting subsides. Call your physician if vomiting continues.  Special Instructions/Symptoms: Your throat may feel dry or sore from the anesthesia or the breathing tube placed in your throat during surgery. If this causes discomfort, gargle with warm salt water. The discomfort should disappear within 24 hours.  If you had a scopolamine patch placed behind your ear for the management of post- operative nausea and/or vomiting:  1. The medication in the patch is effective for 72 hours, after which it should be removed.  Wrap patch in a tissue and discard in the trash. Wash hands thoroughly with soap and water. 2. You may remove the patch earlier than 72 hours if you experience unpleasant side effects which may include dry mouth, dizziness or visual disturbances. 3. Avoid touching the patch. Wash your hands with soap and water after contact with the patch.

## 2015-10-07 NOTE — Anesthesia Postprocedure Evaluation (Signed)
Anesthesia Post Note  Patient: Daun PeacockRoger Becknell  Procedure(s) Performed: Procedure(s) (LRB): DEBRIDEMENT OF BACK WOUND (N/A) APPLICATION OF ACELL AND VAC DRESSING TO BACK WOUND (N/A)  Patient location during evaluation: PACU Anesthesia Type: General Level of consciousness: awake and alert and patient cooperative Pain management: pain level controlled Vital Signs Assessment: post-procedure vital signs reviewed and stable Respiratory status: spontaneous breathing and respiratory function stable Cardiovascular status: stable Anesthetic complications: no    Last Vitals:  Filed Vitals:   10/07/15 1600 10/07/15 1608  BP: 120/77   Pulse: 48 51  Temp:    Resp: 23 13    Last Pain:  Filed Vitals:   10/07/15 1610  PainSc: 0-No pain                 Davanta Meuser S

## 2015-10-07 NOTE — Anesthesia Procedure Notes (Signed)
Procedure Name: Intubation Date/Time: 10/07/2015 2:26 PM Performed by: Burna CashONRAD, Liridona Mashaw C Pre-anesthesia Checklist: Patient identified, Emergency Drugs available, Suction available and Patient being monitored Patient Re-evaluated:Patient Re-evaluated prior to inductionOxygen Delivery Method: Circle System Utilized Preoxygenation: Pre-oxygenation with 100% oxygen Intubation Type: IV induction Ventilation: Mask ventilation without difficulty Laryngoscope Size: Mac and 3 Grade View: Grade I Tube type: Oral Tube size: 8.0 mm Number of attempts: 1 Airway Equipment and Method: Stylet and Oral airway Placement Confirmation: ETT inserted through vocal cords under direct vision,  positive ETCO2 and breath sounds checked- equal and bilateral Secured at: 23 cm Tube secured with: Tape Dental Injury: Teeth and Oropharynx as per pre-operative assessment

## 2015-10-07 NOTE — Brief Op Note (Signed)
10/07/2015  3:18 PM  PATIENT:  Jordan Bailey  50 y.o. male  PRE-OPERATIVE DIAGNOSIS:  BACK WOUND  POST-OPERATIVE DIAGNOSIS:  same  PROCEDURE:  Procedure(s): DEBRIDEMENT OF BACK WOUND (N/A) A CELL (N/A)  SURGEON:  Surgeon(s) and Role:    * Annalis Kaczmarczyk S Genoveva Singleton, DO - Primary  PHYSICIAN ASSISTANT: Shawn Rayburn, PA  ASSISTANTS: none   ANESTHESIA:   local and general  EBL:  Total I/O In: 1500 [I.V.:1500] Out: -   BLOOD ADMINISTERED:none  DRAINS: none   LOCAL MEDICATIONS USED:  yes  SPECIMEN:  No Specimen  DISPOSITION OF SPECIMEN:  N/A  COUNTS:  YES  TOURNIQUET:  * No tourniquets in log *  DICTATION: .Dragon Dictation  PLAN OF CARE: Discharge to home after PACU  PATIENT DISPOSITION:  PACU - hemodynamically stable.   Delay start of Pharmacological VTE agent (>24hrs) due to surgical blood loss or risk of bleeding: no

## 2015-10-07 NOTE — H&P (Signed)
Jordan Bailey is an 50 y.o. male.   Chief Complaint: wound HPI: The patient is a 50 yrs old bm here for treatment of his back wound.   He underwent a circumferential panniculectomy/body lift in [place].  He had wound break down at the area of the vertical and horizontal incision.  His prealbumin is within normal limits as well as his HgA1C.  He has been treated in the outpatient setting with wound care [facility]  Past Medical History  Diagnosis Date  . Kidney stones   . Arthritis   . Iron deficiency anemia     Past Surgical History  Procedure Laterality Date  . 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 Reattached tendon Left 05/2011    LEFT LEG  . Colonoscopy    . Total knee arthroplasty Left 03/06/2015    Procedure: TOTAL KNEE ARTHROPLASTY;  Surgeon: Jordan GeraldsJohn Graves, MD;  Location: [facility] OR;  Service: Orthopedics;  Laterality: Left;  . Total knee arthroplasty Right 06/22/2015    Procedure: RIGHT TOTAL KNEE ARTHROPLASTY;  Surgeon: Jordan GeraldsJohn Graves, MD;  Location: [facility] OR;  Service: Orthopedics;  Laterality: Right;  . Cosmetic surgery      360 degree he had a 360 plastic surgery operation last week where excess skin was removed around his trunkhe had a 360 plastic surgery operation  excess skin was removed around his trunk  . Joint replacement Bilateral     Family History  Problem Relation Age of Onset  . Diabetes Mother    Social History:  reports that he has been smoking Cigars.  He has quit using smokeless tobacco. He reports that he drinks alcohol. He reports that he does not use illicit drugs.  Allergies: No Known Allergies  No prescriptions prior to admission    No results found for this or any previous visit (from the past 48 hour(s)). No results found.  Review of Systems  Constitutional: Negative.   HENT: Negative.   Eyes: Negative.   Respiratory: Negative.   Cardiovascular: Negative.   Gastrointestinal: Negative.   Genitourinary: Negative.   Musculoskeletal: Negative.     Skin: Negative.   Neurological: Negative.   Psychiatric/Behavioral: Negative.     Height 5\' 10"  (1.778 m), weight 83.915 kg (185 lb). Physical Exam  Constitutional: He is oriented to person, place, and time. He appears well-developed.  HENT:  Head: Normocephalic and atraumatic.  Eyes: Conjunctivae and EOM are normal. Pupils are equal, round, and reactive to light.  Cardiovascular: Normal rate.   Respiratory: Effort normal.  GI: Soft.  Musculoskeletal:       Back:  Neurological: He is alert and oriented to person, place, and time.  Skin: Skin is warm.  Psychiatric: He has a normal mood and affect. His behavior is normal.     Assessment/Plan He presents for Acell and VAC placement with debridement of the back wound.  Jordan Bailey 10/07/2015, 8:53 AM

## 2015-10-07 NOTE — Anesthesia Preprocedure Evaluation (Signed)
Anesthesia Evaluation  Patient identified by MRN, date of birth, ID band Patient awake    Reviewed: Allergy & Precautions, NPO status , Patient's Chart, lab work & pertinent test results  Airway Mallampati: II   Neck ROM: full    Dental   Pulmonary Current Smoker,    breath sounds clear to auscultation       Cardiovascular negative cardio ROS   Rhythm:regular Rate:Normal     Neuro/Psych  Neuromuscular disease    GI/Hepatic   Endo/Other    Renal/GU      Musculoskeletal  (+) Arthritis ,   Abdominal   Peds  Hematology  (+) anemia ,   Anesthesia Other Findings   Reproductive/Obstetrics                             Anesthesia Physical Anesthesia Plan  ASA: II  Anesthesia Plan: General   Post-op Pain Management:    Induction: Intravenous  Airway Management Planned: Oral ETT  Additional Equipment:   Intra-op Plan:   Post-operative Plan: Extubation in OR  Informed Consent: I have reviewed the patients History and Physical, chart, labs and discussed the procedure including the risks, benefits and alternatives for the proposed anesthesia with the patient or authorized representative who has indicated his/her understanding and acceptance.     Plan Discussed with: CRNA, Anesthesiologist and Surgeon  Anesthesia Plan Comments:         Anesthesia Quick Evaluation

## 2015-10-07 NOTE — Op Note (Signed)
Operative Note   DATE OF OPERATION: 10/07/2015  [facility]  SURGICAL DIVISION: Plastic Surgery  PREOPERATIVE DIAGNOSES:  Back wound   POSTOPERATIVE DIAGNOSES:  same  PROCEDURE:  Preparation of back wound 4 x 5 x 1 cm for placement of Acell (1 gm and 5 x 5 sheet) and VAC dressing.  SURGEON: Wayland Denislaire Sanger, DO  ASSISTANT: Shawn Rayburn, PA  ANESTHESIA:  General.   COMPLICATIONS: None.   INDICATIONS FOR PROCEDURE:  The patient, Jordan Bailey is a 50 y.o. male born on 10/07/1965, is here for treatment of a back wound that he has been dealing with for several months.  He had gastric bypass with significant weight reduction.  He then underwent a circumferential panniculectomy [place] out of state facility.  He had breakdown at the juncture of the vertical and horizontal incision. He presents for surgical treatment. MRN: [id number]  CONSENT:  Informed consent was obtained directly from the patient. Risks, benefits and alternatives were fully discussed. Specific risks including but not limited to bleeding, infection, hematoma, seroma, scarring, pain, infection, contracture, asymmetry, wound healing problems, and need for further surgery were all discussed. The patient did have an ample opportunity to have questions answered to satisfaction.   DESCRIPTION OF PROCEDURE:  The patient was taken to the operating room. SCDs were placed and IV antibiotics were given. The patient's operative site was prepped and draped in a sterile fashion. A time out was performed and all information was confirmed to be correct.  General anesthesia was administered.  The right side had 2 cm of tunneling at the 3 o'clock position and left side had 4 cm of tunneling at the 9 o'clock position. The wound was 4 x 5 x 1 cm in size.    The wound was irrigated with antibiotic solution.  The currette was used to debride the tunnel and base of the wound.  The skin edges were excised for 2 mm with a  #10 blade.  All of the Acell power (1 gm) and sheet (5 x 5 cm) were appled and secured with 5-0 Vicryl.  The adaptic was placed and secured with 5-0 Silk.  The white VAC sponge was placed followed by the black.  There was an excellent seal.  The patient tolerated the procedure well.  There were no complications. The patient was allowed to wake from anesthesia, extubated and taken to the recovery room in satisfactory condition.

## 2015-10-07 NOTE — Transfer of Care (Signed)
Immediate Anesthesia Transfer of Care Note  Patient: Jordan Bailey  Procedure(s) Performed: Procedure(s): DEBRIDEMENT OF BACK WOUND (N/A) A CELL (N/A)  Patient Location: PACU  Anesthesia Type:General  Level of Consciousness: sedated  Airway & Oxygen Therapy: Patient Spontanous Breathing and Patient connected to face mask oxygen  Post-op Assessment: Report given to RN and Post -op Vital signs reviewed and stable  Post vital signs: Reviewed and stable  Last Vitals:  Filed Vitals:   10/07/15 1300  BP: 124/63  Pulse: 59  Temp: 36.6 C  Resp: 16    Complications: No apparent anesthesia complications

## 2015-10-08 ENCOUNTER — Encounter (HOSPITAL_BASED_OUTPATIENT_CLINIC_OR_DEPARTMENT_OTHER): Payer: Self-pay | Admitting: Plastic Surgery

## 2015-10-16 DIAGNOSIS — S21209D Unspecified open wound of unspecified back wall of thorax without penetration into thoracic cavity, subsequent encounter: Secondary | ICD-10-CM | POA: Diagnosis not present

## 2015-10-26 DIAGNOSIS — S21209D Unspecified open wound of unspecified back wall of thorax without penetration into thoracic cavity, subsequent encounter: Secondary | ICD-10-CM | POA: Diagnosis not present

## 2015-10-26 DIAGNOSIS — T8131XD Disruption of external operation (surgical) wound, not elsewhere classified, subsequent encounter: Secondary | ICD-10-CM | POA: Diagnosis not present

## 2015-11-02 DIAGNOSIS — S21209D Unspecified open wound of unspecified back wall of thorax without penetration into thoracic cavity, subsequent encounter: Secondary | ICD-10-CM | POA: Diagnosis not present

## 2015-11-09 DIAGNOSIS — S21209D Unspecified open wound of unspecified back wall of thorax without penetration into thoracic cavity, subsequent encounter: Secondary | ICD-10-CM | POA: Diagnosis not present

## 2015-11-16 DIAGNOSIS — S21209D Unspecified open wound of unspecified back wall of thorax without penetration into thoracic cavity, subsequent encounter: Secondary | ICD-10-CM | POA: Diagnosis not present

## 2015-11-27 DIAGNOSIS — S21209D Unspecified open wound of unspecified back wall of thorax without penetration into thoracic cavity, subsequent encounter: Secondary | ICD-10-CM | POA: Diagnosis not present

## 2015-11-28 DIAGNOSIS — L732 Hidradenitis suppurativa: Secondary | ICD-10-CM | POA: Diagnosis not present

## 2015-12-15 ENCOUNTER — Ambulatory Visit: Payer: Medicare Other | Admitting: Family Medicine

## 2015-12-16 ENCOUNTER — Ambulatory Visit (INDEPENDENT_AMBULATORY_CARE_PROVIDER_SITE_OTHER): Payer: Medicare Other | Admitting: Family Medicine

## 2015-12-16 ENCOUNTER — Encounter: Payer: Self-pay | Admitting: Family Medicine

## 2015-12-16 VITALS — BP 131/79 | HR 56 | Wt 211.0 lb

## 2015-12-16 DIAGNOSIS — D508 Other iron deficiency anemias: Secondary | ICD-10-CM

## 2015-12-16 DIAGNOSIS — R609 Edema, unspecified: Secondary | ICD-10-CM | POA: Diagnosis not present

## 2015-12-16 DIAGNOSIS — E291 Testicular hypofunction: Secondary | ICD-10-CM | POA: Diagnosis not present

## 2015-12-16 DIAGNOSIS — M25473 Effusion, unspecified ankle: Secondary | ICD-10-CM | POA: Diagnosis not present

## 2015-12-16 DIAGNOSIS — R600 Localized edema: Secondary | ICD-10-CM

## 2015-12-16 LAB — IRON AND TIBC
%SAT: 40 % (ref 15–60)
IRON: 98 ug/dL (ref 50–180)
TIBC: 248 ug/dL — ABNORMAL LOW (ref 250–425)
UIBC: 150 ug/dL (ref 125–400)

## 2015-12-16 LAB — HEMOGLOBIN: HEMOGLOBIN: 12.3 g/dL — AB (ref 13.0–17.0)

## 2015-12-16 NOTE — Progress Notes (Signed)
CC: Jordan Bailey is a 50 y.o. male is here for Edema and Medication Management   Subjective: HPI:  Iron deficiency anemia: He is taking iron sulfate 3 times a day without known side effects. He denies any bleeding or bruising abnormalities. He denies any shortness of breath but does have an occasional sense of fatigue, no more than mild in severity.  Follow-up hypogonadism: He is not made a decision yet on whether or not he would want to start testosterone supplementation if he is a candidate but he is interested in looking into whether or not he still is a candidate he's had one out of 2 testosterone lab draws and this single lab confirmed the presence of hypogonadism.  He continues to have edema in both lower ankles if he does not take a daily torsemide. Symptoms are mild in severity and seemed to be worse at the end of the day. He denies orthopnea and nor edema elsewhere. Denies irregular heart beat or chest pain   Review Of Systems Outlined In HPI  Past Medical History  Diagnosis Date  . Kidney stones   . Arthritis   . Iron deficiency anemia     Past Surgical History  Procedure Laterality Date  . 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 Reattached tendon Left 05/2011    LEFT LEG  . Colonoscopy    . 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 Total knee arthroplasty Right 06/22/2015    Procedure: RIGHT TOTAL KNEE ARTHROPLASTY;  Surgeon: Jodi Geralds, MD;  Location: [facility] OR;  Service: Orthopedics;  Laterality: Right;  . Cosmetic surgery      360 degree he had a 360 plastic surgery operation last week where excess skin was removed around his trunkhe had a 360 plastic surgery operation  excess skin was removed around his trunk  . Joint replacement Bilateral   . Incision and drainage of wound N/A 10/07/2015    Procedure: DEBRIDEMENT OF BACK WOUND;   Surgeon: Peggye Form, DO;  Location: [facility];  Service: Plastics;  Laterality: N/A;  . Application of a-cell of back N/A 10/07/2015    Procedure: APPLICATION OF ACELL AND VAC DRESSING TO BACK WOUND;  Surgeon: Peggye Form, DO;  Location: [facility];  Service: Plastics;  Laterality: N/A;   Family History  Problem Relation Age of Onset  . Diabetes Mother     Social History   Social History  . Marital Status: Married    Spouse Name: N/A  . Number of Children: N/A  . Years of Education: N/A   Occupational History  . Not on file.   Social History Main Topics  . Smoking status: Light Tobacco Smoker -- 18 years    Types: Cigars  . Smokeless tobacco: Former Neurosurgeon  . Alcohol Use: 0.0 oz/week    0 Standard drinks or equivalent per week     Comment: rare  . Drug Use: No  . Sexual Activity:    Partners: Female   Other Topics Concern  . Not on file   Social History Narrative     Objective: BP 131/79 mmHg  Pulse 56  Wt 211 lb (95.709 kg)  General: Alert and Oriented, No Acute Distress HEENT: Pupils equal, round, reactive to light. Conjunctivae clear.  Moist mucous membranes Lungs: Clear to auscultation bilaterally, no wheezing/ronchi/rales.  Comfortable work of  breathing. Good air movement. Cardiac: Regular rate and rhythm. Normal S1/S2.  No murmurs, rubs, nor gallops.   Extremities: 1+ nonpitting edema in both ankles, symmetrical.  Strong peripheral pulses.  Mental Status: No depression, anxiety, nor agitation. Skin: Warm and dry.  Assessment & Plan: Jordan Bailey was seen today for edema and medication management.  Diagnoses and all orders for this visit:  Other iron deficiency anemias -     Iron and TIBC -     Hemoglobin  Hypogonadism in male -     Testosterone  Localized edema -     B Nat Peptide  Ankle swelling, unspecified laterality   Iron deficiency anemia: Clinically asymptomatic and stable due for repeat hemoglobin  and will check an iron level to see if he can cut back on his iron supplementation Hypogonadism: Rechecking testosterone and if the second test is in hypogonadism range he will start thinking about possible replacement therapy Edema: His understandably concerned that he might have heart failure therefore checking a BNP today  Return in about 3 months (around 03/17/2016) for swelling follow up.

## 2015-12-17 LAB — TESTOSTERONE: Testosterone: 503 ng/dL (ref 250–827)

## 2015-12-17 LAB — BRAIN NATRIURETIC PEPTIDE: BRAIN NATRIURETIC PEPTIDE: 58.3 pg/mL (ref <100)

## 2015-12-18 ENCOUNTER — Telehealth: Payer: Self-pay

## 2015-12-18 NOTE — Telephone Encounter (Signed)
Pt would like to know what maybe the cause of the swelling in his ankles.

## 2015-12-18 NOTE — Telephone Encounter (Signed)
A condition called "venous insufficiency" where the valves in our veins begin to lose their efficiency and the pooling of blood in the lower extremities causes serum to leak into the soft tissues.  Treatment involves compression stockings and medication like lasix/furosemide.

## 2015-12-22 MED ORDER — FUROSEMIDE 20 MG PO TABS: ORAL_TABLET | ORAL | Status: DC

## 2015-12-22 NOTE — Telephone Encounter (Signed)
Pt notified and would like a rx for lasix sent to [facility] [address] in [place]

## 2015-12-22 NOTE — Telephone Encounter (Signed)
Rx sent in

## 2016-03-08 IMAGING — US US EXTREM LOW VENOUS*R*
1 series · 13 of 24 positions shown · non-contrast
Comparison: None

CLINICAL DATA: RIGHT lower extremity edema, knee surgery 2 months
ago with surgery true of excess skin 2 weeks ago



[Series 2: us extrem low venous*right* · 0.08mm/px · 26 acquisitions, 13 frames shown]
[im 1/26]
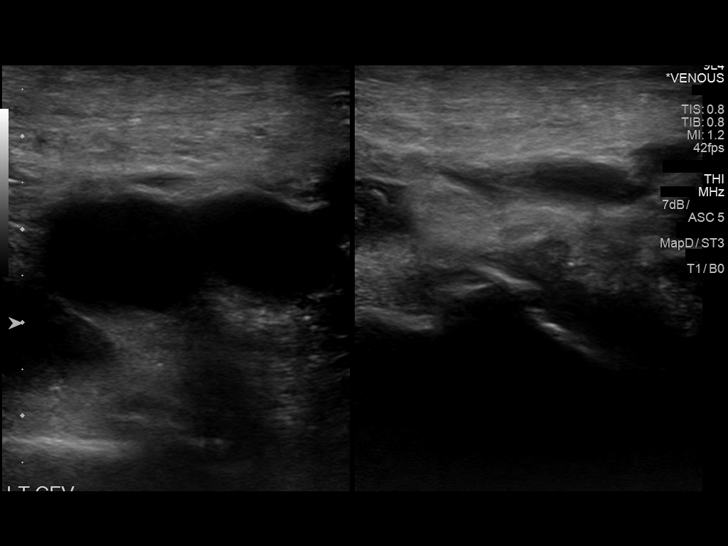
[im 3/26]
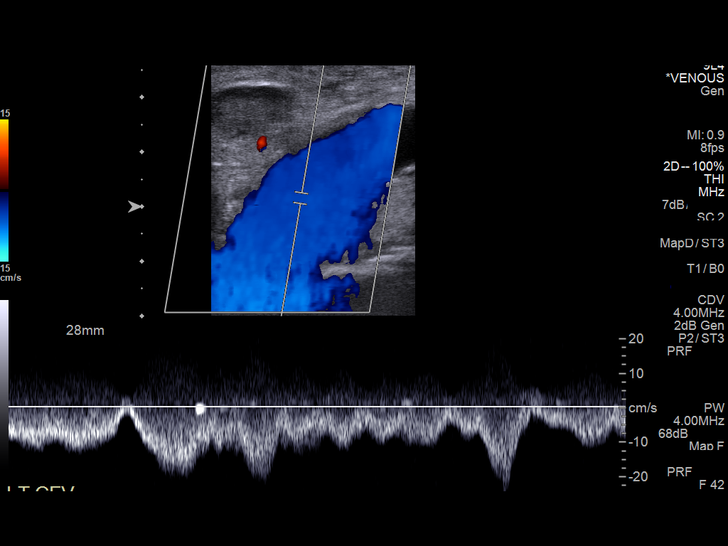
[im 5/26]
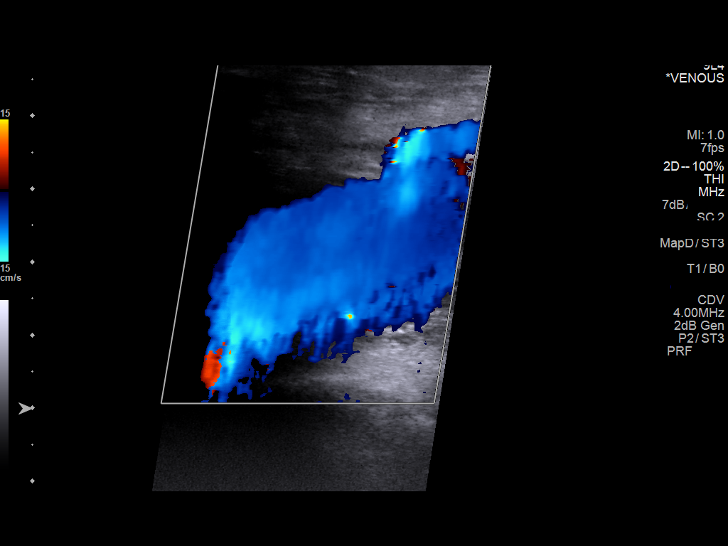
[im 8/26]
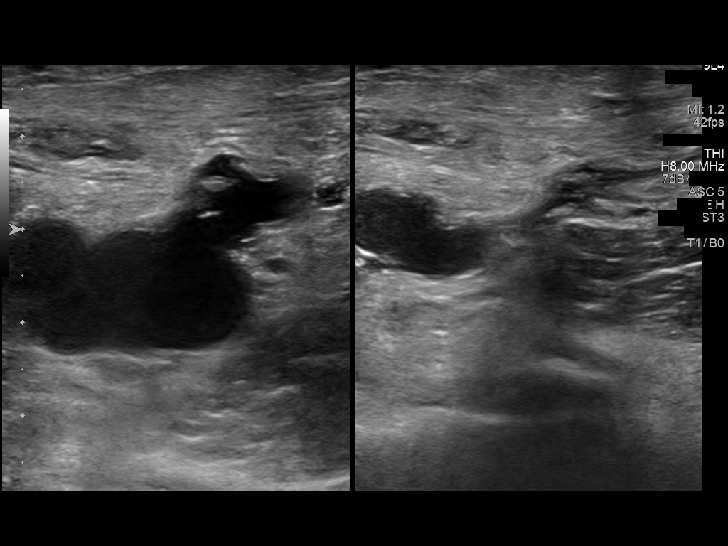
[im 10/26]
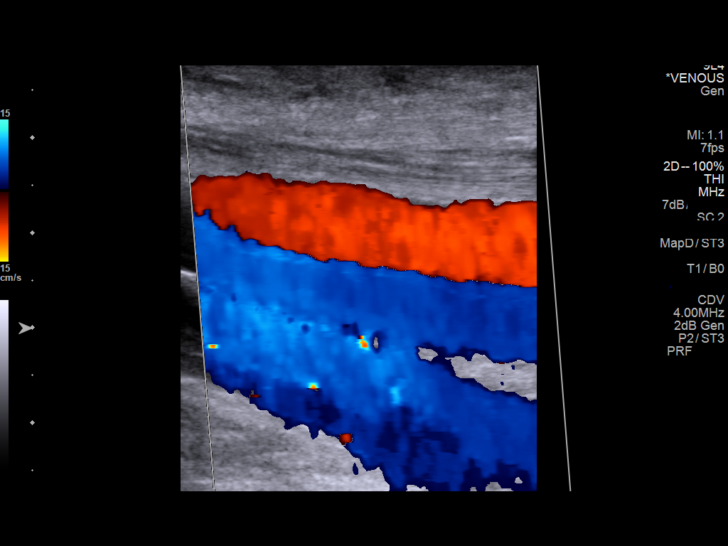
[im 12/26]
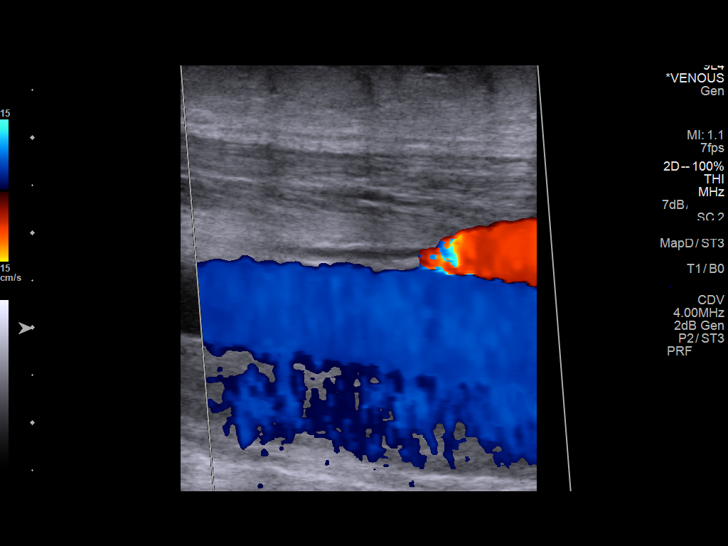
[im 15/26]
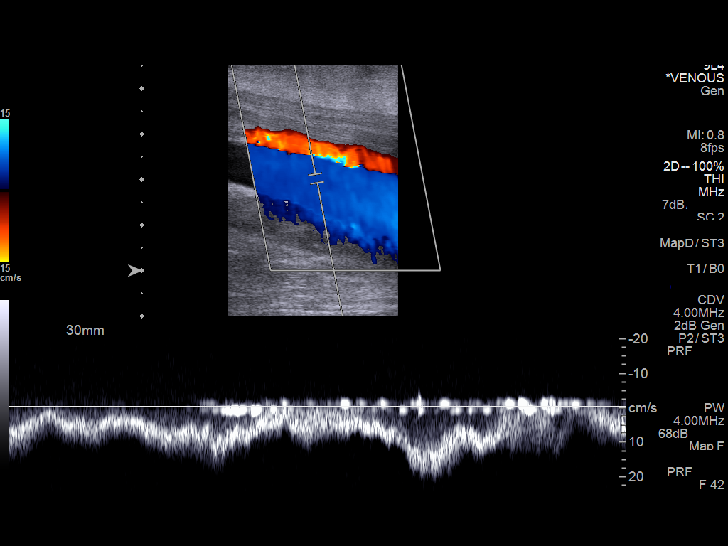
[im 16/26]
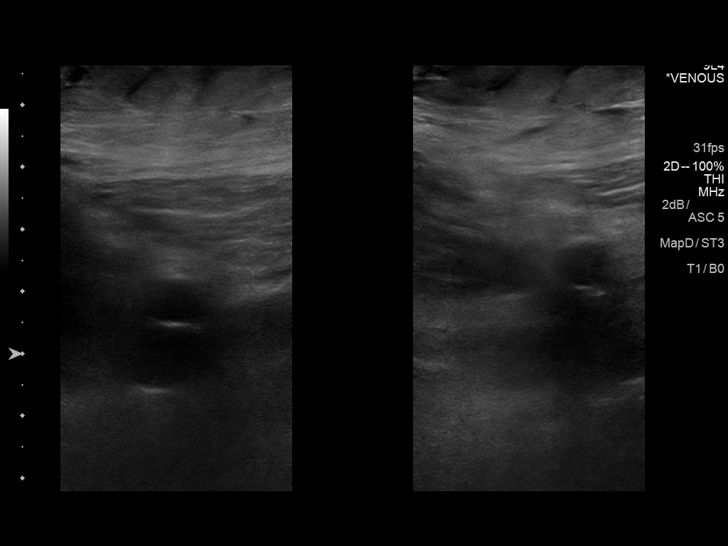
[im 18/26]
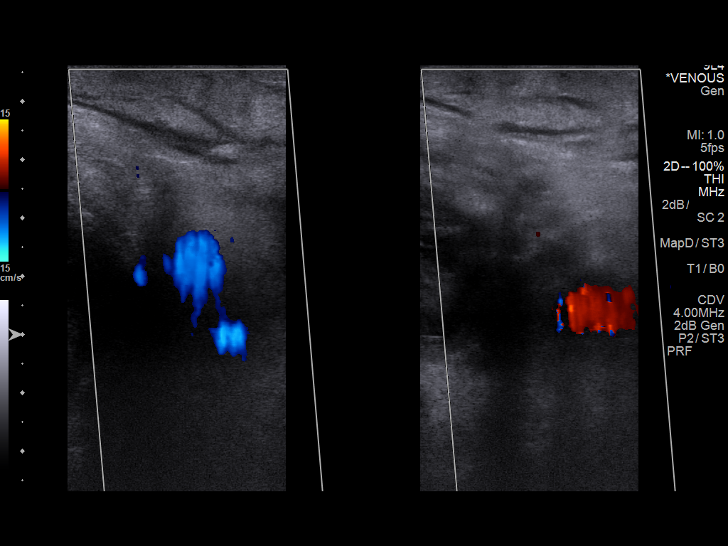
[im 20/26]
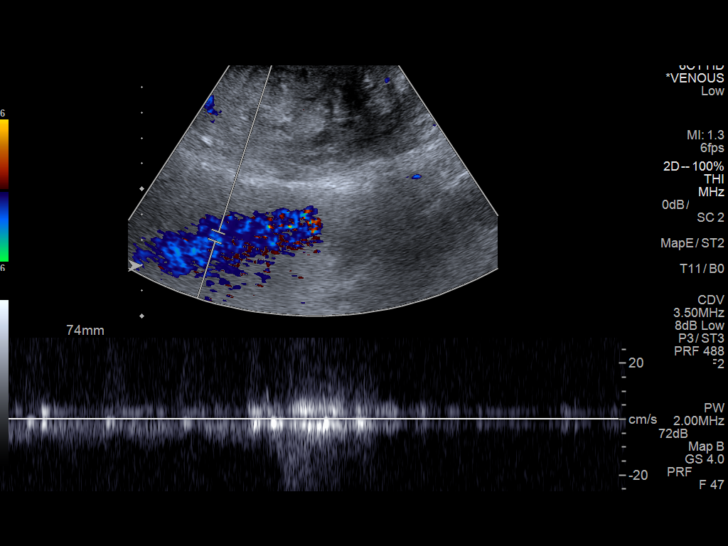
[im 22/26]
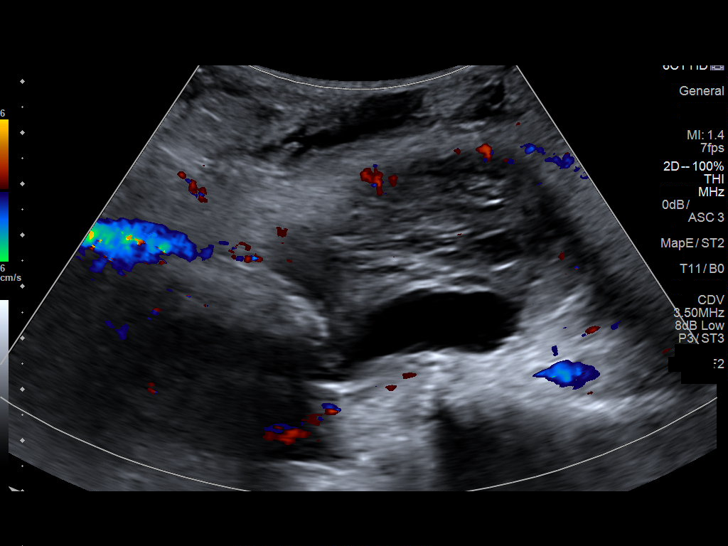
[im 24/26]
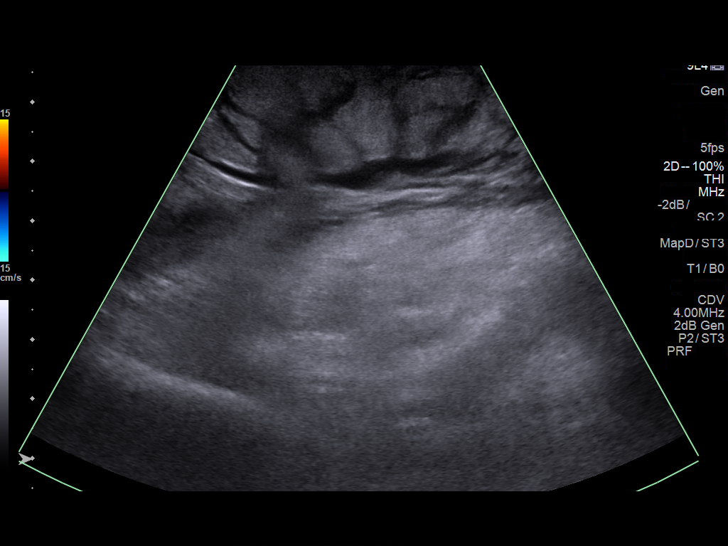
[im 26/26]
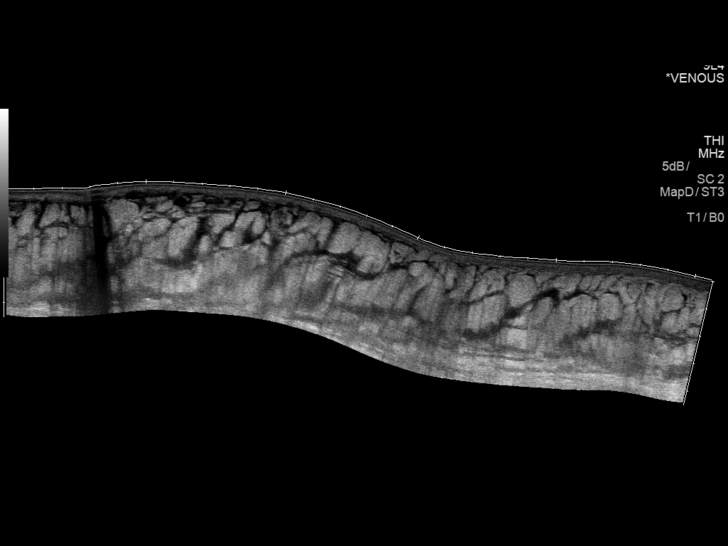

[13 of 24 positions shown; findings below may reference images not displayed]

FINDINGS: Contralateral Common Femoral Vein: Respiratory phasicity is normal
and symmetric with the symptomatic side. No evidence of thrombus.
Normal compressibility.

Common Femoral Vein: No evidence of thrombus. Normal
compressibility, respiratory phasicity and response to augmentation.

Saphenofemoral Junction: No evidence of thrombus. Normal
compressibility and flow on color Doppler imaging.

Profunda Femoral Vein: No evidence of thrombus. Normal
compressibility and flow on color Doppler imaging.

Femoral Vein: No evidence of thrombus. Normal compressibility,
respiratory phasicity and response to augmentation.

Popliteal Vein: No evidence of thrombus. Normal compressibility,
respiratory phasicity and response to augmentation.

Calf Veins: Peroneal vein not visualized. No evidence of thrombus.
Normal compressibility and flow on color Doppler imaging.

Superficial Great Saphenous Vein: No evidence of thrombus above
knee. Normal compressibility and flow on color Doppler imaging. Not
visualized below knee due to significant amount of subcutaneous
edema.

Venous Reflux:  None.

Other Findings: Significant subcutaneous edema at the RIGHT lower
leg.
IMPRESSION: No evidence of deep venous thrombosis in RIGHT lower extremity as
above.

Significant subcutaneous edema lower RIGHT leg.

## 2016-04-11 ENCOUNTER — Telehealth: Payer: Self-pay

## 2016-04-11 NOTE — Telephone Encounter (Signed)
Pt apartment complex is remodeling the parking lot.  In order for Jordan Bailey to keep the nearest parking space to close to his apartment, management is asking for a letter from you explaining his condition concerning his knees and why he can't walk distance. Please advise.

## 2016-04-12 NOTE — Telephone Encounter (Signed)
Letter in your in box. 

## 2016-04-12 NOTE — Telephone Encounter (Signed)
Wife notified.

## 2016-06-04 ENCOUNTER — Other Ambulatory Visit: Payer: Self-pay | Admitting: Family Medicine

## 2016-07-08 ENCOUNTER — Encounter: Payer: Self-pay | Admitting: Family Medicine

## 2016-07-08 ENCOUNTER — Ambulatory Visit (INDEPENDENT_AMBULATORY_CARE_PROVIDER_SITE_OTHER): Payer: Medicare Other | Admitting: Family Medicine

## 2016-07-08 VITALS — BP 127/79 | HR 53 | Wt 240.0 lb

## 2016-07-08 DIAGNOSIS — M949 Disorder of cartilage, unspecified: Secondary | ICD-10-CM

## 2016-07-08 DIAGNOSIS — S76112S Strain of left quadriceps muscle, fascia and tendon, sequela: Secondary | ICD-10-CM | POA: Diagnosis not present

## 2016-07-08 DIAGNOSIS — Z114 Encounter for screening for human immunodeficiency virus [HIV]: Secondary | ICD-10-CM | POA: Diagnosis not present

## 2016-07-08 DIAGNOSIS — Z9884 Bariatric surgery status: Secondary | ICD-10-CM

## 2016-07-08 DIAGNOSIS — E6609 Other obesity due to excess calories: Secondary | ICD-10-CM

## 2016-07-08 DIAGNOSIS — E291 Testicular hypofunction: Secondary | ICD-10-CM

## 2016-07-08 DIAGNOSIS — R739 Hyperglycemia, unspecified: Secondary | ICD-10-CM

## 2016-07-08 DIAGNOSIS — Z6834 Body mass index (BMI) 34.0-34.9, adult: Secondary | ICD-10-CM | POA: Diagnosis not present

## 2016-07-08 DIAGNOSIS — E538 Deficiency of other specified B group vitamins: Secondary | ICD-10-CM | POA: Diagnosis not present

## 2016-07-08 DIAGNOSIS — D508 Other iron deficiency anemias: Secondary | ICD-10-CM

## 2016-07-08 DIAGNOSIS — M899 Disorder of bone, unspecified: Secondary | ICD-10-CM | POA: Diagnosis not present

## 2016-07-08 DIAGNOSIS — Z5181 Encounter for therapeutic drug level monitoring: Secondary | ICD-10-CM | POA: Diagnosis not present

## 2016-07-08 LAB — COMPLETE METABOLIC PANEL WITH GFR
ALT: 7 U/L — ABNORMAL LOW (ref 9–46)
AST: 17 U/L (ref 10–40)
Albumin: 3.7 g/dL (ref 3.6–5.1)
Alkaline Phosphatase: 68 U/L (ref 40–115)
BUN: 11 mg/dL (ref 7–25)
CO2: 25 mmol/L (ref 20–31)
Calcium: 9 mg/dL (ref 8.6–10.3)
Chloride: 105 mmol/L (ref 98–110)
Creat: 0.83 mg/dL (ref 0.60–1.35)
Glucose, Bld: 88 mg/dL (ref 65–99)
POTASSIUM: 4.5 mmol/L (ref 3.5–5.3)
Sodium: 140 mmol/L (ref 135–146)
Total Bilirubin: 0.8 mg/dL (ref 0.2–1.2)
Total Protein: 6.7 g/dL (ref 6.1–8.1)

## 2016-07-08 LAB — HEMOGLOBIN A1C
HEMOGLOBIN A1C: 5.3 % (ref <5.7)
Mean Plasma Glucose: 105 mg/dL

## 2016-07-08 LAB — IRON AND TIBC
%SAT: 39 % (ref 15–60)
IRON: 100 ug/dL (ref 50–180)
TIBC: 255 ug/dL (ref 250–425)
UIBC: 155 ug/dL (ref 125–400)

## 2016-07-08 LAB — CBC
HEMATOCRIT: 39.4 % (ref 38.5–50.0)
HEMOGLOBIN: 13.1 g/dL — AB (ref 13.2–17.1)
MCH: 25.4 pg — ABNORMAL LOW (ref 27.0–33.0)
MCHC: 33.2 g/dL (ref 32.0–36.0)
MCV: 76.4 fL — AB (ref 80.0–100.0)
MPV: 9.3 fL (ref 7.5–12.5)
Platelets: 204 10*3/uL (ref 140–400)
RBC: 5.16 MIL/uL (ref 4.20–5.80)
RDW: 15 % (ref 11.0–15.0)
WBC: 3.9 10*3/uL (ref 3.8–10.8)

## 2016-07-08 LAB — VITAMIN B12: VITAMIN B 12: 637 pg/mL (ref 200–1100)

## 2016-07-08 LAB — FOLATE: FOLATE: 19.6 ng/mL (ref >5.4)

## 2016-07-08 LAB — LIPID PANEL
CHOL/HDL RATIO: 2.1 ratio (ref <=5.0)
CHOLESTEROL: 126 mg/dL (ref 125–200)
HDL: 61 mg/dL (ref >=40)
LDL CALC: 56 mg/dL (ref <130)
Triglycerides: 43 mg/dL (ref <150)
VLDL: 9 mg/dL (ref <30)

## 2016-07-08 LAB — FERRITIN: Ferritin: 281 ng/mL (ref 20–380)

## 2016-07-08 LAB — TSH: TSH: 1.14 m[IU]/L (ref 0.40–4.50)

## 2016-07-08 NOTE — Patient Instructions (Signed)
Thank you for coming in today. Get labs.  I will let you know what they show.  Call or go to the emergency room if you get worse, have trouble breathing, have chest pains, or palpitations.

## 2016-07-08 NOTE — Progress Notes (Signed)
Jordan PeacockRoger Bailey is a 50 y.o. male who [facility]: [facility] today for follow up iron deficiency anemia status post gastric bypass surgery.  Patient has a history of gastric bypass surgery resulting in iron deficiency anemia. He's been taking oral iron which is been working well. She denies significant fatigue. Additionally he has a history of low testosterone as well. He does not take any medications for this.  An additional issue is also left knee pain and weakness. He has a history of left quadriceps tendon rupture with resulting retraction and atrophy for 2 months prior to surgery. He has a left total knee replacement as well. He notes significant left leg weakness and inability to fully extend his knee. This causes a limp and is annoying and interfere with activities such as exercise and work.   Past Medical History:  Diagnosis Date  . Arthritis   . Iron deficiency anemia   . Kidney stones    Past Surgical History:  Procedure Laterality Date  . APPLICATION OF A-CELL OF BACK N/A 10/07/2015   Procedure: APPLICATION OF ACELL AND VAC DRESSING TO BACK WOUND;  Surgeon: Jordan Formlaire S Dillingham, DO;  Location: [facility];  Service: Plastics;  Laterality: N/A;  . COLONOSCOPY    . COSMETIC SURGERY     360 degree he had a 360 plastic surgery operation last week where excess skin was removed around his trunkhe had a 360 plastic surgery operation  excess skin was removed around his trunk  . INCISION AND DRAINAGE OF WOUND N/A 10/07/2015   Procedure: DEBRIDEMENT OF BACK WOUND;  Surgeon: Jordan Formlaire S Dillingham, DO;  Location: [facility];  Service: Plastics;  Laterality: N/A;  . JOINT REPLACEMENT Bilateral   . 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 REATTACHED TENDON Left 05/2011   LEFT LEG  . TOTAL KNEE ARTHROPLASTY Left 03/06/2015   Procedure: TOTAL  KNEE ARTHROPLASTY;  Surgeon: Jordan GeraldsJohn Graves, MD;  Location: [facility];  Service: Orthopedics;  Laterality: Left;  . TOTAL KNEE ARTHROPLASTY Right 06/22/2015   Procedure: RIGHT TOTAL KNEE ARTHROPLASTY;  Surgeon: Jordan GeraldsJohn Graves, MD;  Location: [facility] OR;  Service: Orthopedics;  Laterality: Right;   Social History  Substance Use Topics  . Smoking status: Light Tobacco Smoker    Years: 18.00    Types: Cigars  . Smokeless tobacco: Former NeurosurgeonUser  . Alcohol use 0.0 oz/week     Comment: rare   family history includes Diabetes in his mother.  ROS as above:  Medications: Current Outpatient Prescriptions  Medication Sig Dispense Refill  . docusate sodium (COLACE) 100 MG capsule Take 100 mg by mouth daily.    . ferrous sulfate 325 (65 FE) MG tablet TAKE 1 TABLET BY MOUTH 3 TIMES A DAY WITH MEALS 30 tablet 0  . Multiple Vitamins-Minerals (MULTIVITAMIN WITH MINERALS) tablet Take 1 tablet by mouth daily.     No current facility-administered medications for this visit.    No Known Allergies   Exam:  BP 127/79   Pulse (!) 53   Wt 240 lb (108.9 kg)   BMI 34.44 kg/m  Gen: Well NAD Obese HEENT: EOMI,  MMM Lungs: Normal work of breathing. CTABL Heart: RRR no MRG Abd: NABS, Soft. Nondistended, Nontender Exts: Brisk capillary refill, warm and well perfused.  Left leg: Decreased quadricep bulk. Full passive motion is present however patient cannot actively extend his leg more than 20  No results found for  this or any previous visit (from the past 24 hour(s)). No results found.    Assessment and Plan: 50 y.o. male with  Iron deficiency anemia as well as therapeutic monitoring following gastric bypass. Obtain fasting labs listed below. Will contact patient with results.  Low testosterone: Recheck free and total testosterone fasting today.  Left leg weakness: Patient has an appointment to follow-up with his orthopedic surgeon. I think this is a good idea. He may benefit from further physical therapy and  possibly an assistive brace or device.  Patient declines flu and Tdap vaccines today.   Orders Placed This Encounter  Procedures  . CBC  . COMPLETE METABOLIC PANEL WITH GFR  . Ferritin  . Folate  . Hemoglobin A1c  . Lipid panel  . HIV antibody  . Testosterone Total,Free,Bio, Males  . TSH  . VITAMIN D 25 Hydroxy (Vit-D Deficiency, Fractures)  . Vitamin B12  . Iron and TIBC    Discussed warning signs or symptoms. Please see discharge instructions. Patient expresses understanding.

## 2016-07-09 LAB — HIV ANTIBODY (ROUTINE TESTING W REFLEX): HIV 1&2 Ab, 4th Generation: NONREACTIVE

## 2016-07-09 LAB — VITAMIN D 25 HYDROXY (VIT D DEFICIENCY, FRACTURES): Vit D, 25-Hydroxy: 25 ng/mL — ABNORMAL LOW (ref 30–100)

## 2016-07-11 ENCOUNTER — Encounter: Payer: Self-pay | Admitting: Family Medicine

## 2016-07-11 DIAGNOSIS — E559 Vitamin D deficiency, unspecified: Secondary | ICD-10-CM | POA: Insufficient documentation

## 2016-07-11 LAB — TESTOSTERONE TOTAL,FREE,BIO, MALES
ALBUMIN: 3.7 g/dL (ref 3.6–5.1)
Sex Hormone Binding: 52 nmol/L — ABNORMAL HIGH (ref 10–50)
TESTOSTERONE BIOAVAILABLE: 89.4 ng/dL — AB (ref 110.0–575.0)
TESTOSTERONE: 550 ng/dL (ref 250–827)
Testosterone, Free: 52.3 pg/mL (ref 46.0–224.0)

## 2016-07-15 ENCOUNTER — Other Ambulatory Visit: Payer: Self-pay | Admitting: *Deleted

## 2016-07-15 MED ORDER — FERROUS SULFATE 325 (65 FE) MG PO TABS: ORAL_TABLET | ORAL | 5 refills | Status: DC

## 2016-07-18 DIAGNOSIS — M25552 Pain in left hip: Secondary | ICD-10-CM | POA: Diagnosis not present

## 2016-07-20 DIAGNOSIS — M160 Bilateral primary osteoarthritis of hip: Secondary | ICD-10-CM | POA: Diagnosis not present

## 2016-08-02 DIAGNOSIS — M1612 Unilateral primary osteoarthritis, left hip: Secondary | ICD-10-CM | POA: Diagnosis not present

## 2016-10-28 ENCOUNTER — Telehealth: Payer: Self-pay | Admitting: Family Medicine

## 2016-10-28 NOTE — Telephone Encounter (Signed)
Patient called request to know if he can get an HIV test lab order sent downstairs. Thanks

## 2016-10-28 NOTE — Telephone Encounter (Signed)
We just tested for that in October. Please contact the patient and ask if he needs any other tests other than just the HIV. Typically if patient is worried about an exposure we'll test for gonorrhea Chlamydia syphilis and HIV. I want to make sure we are testing for all the appropriate diseases and not just HIV

## 2016-10-28 NOTE — Telephone Encounter (Signed)
Patient scheduled.

## 2016-11-08 ENCOUNTER — Ambulatory Visit (INDEPENDENT_AMBULATORY_CARE_PROVIDER_SITE_OTHER): Payer: Medicare Other | Admitting: Family Medicine

## 2016-11-08 VITALS — BP 119/71 | HR 61 | Temp 98.6°F | Wt 218.0 lb

## 2016-11-08 DIAGNOSIS — L987 Excessive and redundant skin and subcutaneous tissue: Secondary | ICD-10-CM | POA: Diagnosis not present

## 2016-11-08 DIAGNOSIS — Z9884 Bariatric surgery status: Secondary | ICD-10-CM

## 2016-11-08 DIAGNOSIS — Z202 Contact with and (suspected) exposure to infections with a predominantly sexual mode of transmission: Secondary | ICD-10-CM

## 2016-11-08 DIAGNOSIS — Z113 Encounter for screening for infections with a predominantly sexual mode of transmission: Secondary | ICD-10-CM | POA: Diagnosis not present

## 2016-11-08 DIAGNOSIS — Z114 Encounter for screening for human immunodeficiency virus [HIV]: Secondary | ICD-10-CM

## 2016-11-08 DIAGNOSIS — M1612 Unilateral primary osteoarthritis, left hip: Secondary | ICD-10-CM | POA: Diagnosis not present

## 2016-11-08 NOTE — Patient Instructions (Signed)
Thank you for coming in today. We will get labs results back soon.  You should also hear from plastic surgery soon.

## 2016-11-08 NOTE — Progress Notes (Signed)
Jordan Bailey is a 51 y.o. male who presents to [facility]: Primary Care Sports Medicine today for potential STD exposure and redundant skin.  STD exposure. Patient found out that his wife has been unfaithful to him. He last had sex with his wife in December and notes that the condom broke at that time. He notes that the sexual partner of his wife is HIV positive and they have not been using protection. He is very concerned about his risk for HIV. He denies any urinary frequency urgency or penile discharge and feels well otherwise.  Redundant skin: Patient notes a history of significant weight loss following gastric sleeve. He has redundant skin on his upper extremities and lower extremities. He's had a tummy tuck previously. He notes the skin is painful at times and very bothersome. He is curious about his surgical options.   Past Medical History:  Diagnosis Date  . Arthritis   . Iron deficiency anemia   . Kidney stones    Past Surgical History:  Procedure Laterality Date  . APPLICATION OF A-CELL OF BACK N/A 10/07/2015   Procedure: APPLICATION OF ACELL AND VAC DRESSING TO BACK WOUND;  Surgeon: Peggye Form, DO;  Location: [facility];  Service: Plastics;  Laterality: N/A;  . COLONOSCOPY    . COSMETIC SURGERY     360 degree he had a 360 plastic surgery operation last week where excess skin was removed around his trunkhe had a 360 plastic surgery operation  excess skin was removed around his trunk  . INCISION AND DRAINAGE OF WOUND N/A 10/07/2015   Procedure: DEBRIDEMENT OF BACK WOUND;  Surgeon: Peggye Form, DO;  Location: [facility];  Service: Plastics;  Laterality: N/A;  . JOINT REPLACEMENT Bilateral   . 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 REATTACHED TENDON Left 05/2011   LEFT LEG  . TOTAL KNEE ARTHROPLASTY Left 03/06/2015   Procedure: TOTAL  KNEE ARTHROPLASTY;  Surgeon: Jodi Geralds, MD;  Location: [facility];  Service: Orthopedics;  Laterality: Left;  . TOTAL KNEE ARTHROPLASTY Right 06/22/2015   Procedure: RIGHT TOTAL KNEE ARTHROPLASTY;  Surgeon: Jodi Geralds, MD;  Location: [facility];  Service: Orthopedics;  Laterality: Right;   Social History  Substance Use Topics  . Smoking status: Light Tobacco Smoker    Years: 18.00    Types: Cigars  . Smokeless tobacco: Former Neurosurgeon  . Alcohol use 0.0 oz/week     Comment: rare   family history includes Diabetes in his mother.  ROS as above:  Medications: Current Outpatient Prescriptions  Medication Sig Dispense Refill  . docusate sodium (COLACE) 100 MG capsule Take 100 mg by mouth daily.    . ferrous sulfate 325 (65 FE) MG tablet TAKE 1 TABLET BY MOUTH 3 TIMES A DAY WITH MEALS 30 tablet 5  . Multiple Vitamins-Minerals (MULTIVITAMIN WITH MINERALS) tablet Take 1 tablet by mouth daily.     No current facility-administered medications for this visit.    No Known Allergies  Health Maintenance Health Maintenance  Topic Date Due  . COLONOSCOPY  10/03/2016  . INFLUENZA VACCINE  07/08/2017 (Originally 05/03/2016)  . TETANUS/TDAP  07/08/2018 (Originally 10/03/1985)  . HIV Screening  Completed     Exam:  BP 119/71   Pulse 61   Temp 98.6 F (37 C) (Oral)   Wt 218 lb (98.9 kg)   BMI 31.28 kg/m  Gen: Well NAD HEENT: EOMI,  MMM Lungs: Normal work  of breathing. CTABL Heart: RRR no MRG Abd: NABS, Soft. Nondistended, Nontender Exts: Brisk capillary refill, warm and well perfused.  Genitals: No inguinal lymphadenopathy. No penile lesions. No penile discharge. Testicles are nontender and nondistended bilaterally. Skin: Significant redundant skin in upper and lower extremities.   No results found for this or any previous visit (from the past 72 hour(s)). No results found.    Assessment and Plan: 51 y.o. male with STD exposure. Plan to check labs listed below. Will return in about 3 months  for repeat HIV test.  Excessive skin: Refer to plastic surgery. I believe patient has a medical need.   Orders Placed This Encounter  Procedures  . GC/Chlamydia Probe Amp  . HIV antibody  . RPR  . Ambulatory referral to Plastic Surgery    Referral Priority:   Routine    Referral Type:   Surgical    Referral Reason:   Specialty Services Required    Requested Specialty:   Plastic Surgery    Number of Visits Requested:   1   No orders of the defined types were placed in this encounter.    Discussed warning signs or symptoms. Please see discharge instructions. Patient expresses understanding.

## 2016-11-09 LAB — HIV ANTIBODY (ROUTINE TESTING W REFLEX): HIV 1&2 Ab, 4th Generation: NONREACTIVE

## 2016-11-09 LAB — GC/CHLAMYDIA PROBE AMP

## 2016-11-09 LAB — RPR

## 2017-01-12 ENCOUNTER — Ambulatory Visit (INDEPENDENT_AMBULATORY_CARE_PROVIDER_SITE_OTHER): Payer: Medicare Other | Admitting: Family Medicine

## 2017-01-12 ENCOUNTER — Encounter: Payer: Self-pay | Admitting: Family Medicine

## 2017-01-12 VITALS — BP 129/78 | HR 78 | Wt 211.0 lb

## 2017-01-12 DIAGNOSIS — M1612 Unilateral primary osteoarthritis, left hip: Secondary | ICD-10-CM

## 2017-01-12 DIAGNOSIS — M25552 Pain in left hip: Secondary | ICD-10-CM | POA: Diagnosis not present

## 2017-01-12 NOTE — Progress Notes (Signed)
Jordan Bailey is a 51 y.o. male who presents to [facility] today for left hip pain.  The pain is located in the anterior aspect of his hip and feels stiff.  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  This interferes with his daily life as well as work.  He has not worked in over 6 months.  He has a history of left quadriceps tendon rupture with retraction and atrophy for two months prior to surgery.  He has had both knees replaced as well.  He had been seeing an orthopedic surgeon where he received two joint injections in his left hip.  These have not improved his symptoms and he would like to explore other options at today's visit.  He is currently taking over-the-counter medicines for pain which are insufficient to control his pain.  Past Medical History:  Diagnosis Date  . Arthritis   . Iron deficiency anemia   . Kidney stones    Past Surgical History:  Procedure Laterality Date  . APPLICATION OF A-CELL OF BACK N/A 10/07/2015   Procedure: APPLICATION OF ACELL AND VAC DRESSING TO BACK WOUND;  Surgeon: Peggye Form, DO;  Location: [facility];  Service: Plastics;  Laterality: N/A;  . COLONOSCOPY    . COSMETIC SURGERY     360 degree he had a 360 plastic surgery operation last week where excess skin was removed around his trunkhe had a 360 plastic surgery operation  excess skin was removed around his trunk  . INCISION AND DRAINAGE OF WOUND N/A 10/07/2015   Procedure: DEBRIDEMENT OF BACK WOUND;  Surgeon: Peggye Form, DO;  Location: [facility];  Service: Plastics;  Laterality: N/A;  . JOINT REPLACEMENT Bilateral   . 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 REATTACHED TENDON Left 05/2011   LEFT LEG  . TOTAL KNEE ARTHROPLASTY Left 03/06/2015   Procedure: TOTAL KNEE ARTHROPLASTY;  Surgeon: Jodi Geralds, MD;  Location: [facility];   Service: Orthopedics;  Laterality: Left;  . TOTAL KNEE ARTHROPLASTY Right 06/22/2015   Procedure: RIGHT TOTAL KNEE ARTHROPLASTY;  Surgeon: Jodi Geralds, MD;  Location: [facility];  Service: Orthopedics;  Laterality: Right;   Social History  Substance Use Topics  . Smoking status: Light Tobacco Smoker    Years: 18.00    Types: Cigars  . Smokeless tobacco: Former Neurosurgeon  . Alcohol use 0.0 oz/week     Comment: rare     ROS:  As above   Medications: Current Outpatient Prescriptions  Medication Sig Dispense Refill  . docusate sodium (COLACE) 100 MG capsule Take 100 mg by mouth daily.    . ferrous sulfate 325 (65 FE) MG tablet TAKE 1 TABLET BY MOUTH 3 TIMES A DAY WITH MEALS 30 tablet 5  . Multiple Vitamins-Minerals (MULTIVITAMIN WITH MINERALS) tablet Take 1 tablet by mouth daily.     No current facility-administered medications for this visit.    No Known Allergies   Exam:  BP 129/78   Pulse 78   Wt 211 lb (95.7 kg)   BMI 30.28 kg/m  General: Well Developed, well nourished, and in no acute distress.  Neuro/Psych: Alert and oriented x3, extra-ocular muscles intact, able to move all 4 extremities, sensation grossly intact. Skin: Warm and dry, no rashes noted.  Respiratory: Not using accessory muscles, speaking in full sentences, trachea midline.  Cardiovascular: Pulses palpable,  no extremity edema. Abdomen: Does not appear distended. MSK:  Left hip normal-appearing nontender. Range of motion is extremely limited. Internal rotational range of motion is limited to 10 external rotation range of motion is limited to 20 Flexion is extremely limited as well by pain. Patient is unable to touch his feet. He walks with an antalgic Trendelenburg gait.    No results found for this or any previous visit (from the past 48 hour(s)). No results found.    Assessment and Plan: 51 y.o. male with left hip pain Due to end-stage DJD. Patient has exhausted conservative management. Recommend  following back up with orthopedic surgery to pursue total hip replacement. Return as needed.  I believe this time the patient is disabled and is unable to work. I'm not optimistic about his potential to return to work.    No orders of the defined types were placed in this encounter.   Discussed warning signs or symptoms. Please see discharge instructions. Patient expresses understanding.

## 2017-01-12 NOTE — Patient Instructions (Signed)
Thank you for coming in today. I recommend a hip replacement.  Return as needed.

## 2017-03-17 ENCOUNTER — Ambulatory Visit (INDEPENDENT_AMBULATORY_CARE_PROVIDER_SITE_OTHER): Payer: Medicare Other | Admitting: Licensed Clinical Social Worker

## 2017-03-17 DIAGNOSIS — F329 Major depressive disorder, single episode, unspecified: Secondary | ICD-10-CM

## 2017-03-17 DIAGNOSIS — Z789 Other specified health status: Secondary | ICD-10-CM | POA: Diagnosis not present

## 2017-03-17 DIAGNOSIS — F419 Anxiety disorder, unspecified: Secondary | ICD-10-CM

## 2017-03-17 DIAGNOSIS — Z630 Problems in relationship with spouse or partner: Secondary | ICD-10-CM | POA: Diagnosis not present

## 2017-03-17 DIAGNOSIS — F32A Depression, unspecified: Secondary | ICD-10-CM
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
## 2017-03-30 ENCOUNTER — Ambulatory Visit (INDEPENDENT_AMBULATORY_CARE_PROVIDER_SITE_OTHER): Payer: Medicare Other | Admitting: Licensed Clinical Social Worker

## 2017-03-30 DIAGNOSIS — Z789 Other specified health status: Secondary | ICD-10-CM | POA: Diagnosis not present

## 2017-03-30 DIAGNOSIS — Z630 Problems in relationship with spouse or partner: Secondary | ICD-10-CM

## 2017-03-30 DIAGNOSIS — F32A Depression, unspecified: Secondary | ICD-10-CM

## 2017-03-30 DIAGNOSIS — F329 Major depressive disorder, single episode, unspecified: Secondary | ICD-10-CM

## 2017-03-30 DIAGNOSIS — F419 Anxiety disorder, unspecified: Secondary | ICD-10-CM
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
## 2017-04-10 ENCOUNTER — Ambulatory Visit (INDEPENDENT_AMBULATORY_CARE_PROVIDER_SITE_OTHER): Payer: Medicare Other | Admitting: Licensed Clinical Social Worker

## 2017-04-10 DIAGNOSIS — F419 Anxiety disorder, unspecified: Secondary | ICD-10-CM | POA: Diagnosis not present

## 2017-04-10 DIAGNOSIS — F32A Depression, unspecified: Secondary | ICD-10-CM

## 2017-04-10 DIAGNOSIS — F329 Major depressive disorder, single episode, unspecified: Secondary | ICD-10-CM

## 2017-04-10 DIAGNOSIS — Z789 Other specified health status: Secondary | ICD-10-CM | POA: Diagnosis not present

## 2017-04-10 DIAGNOSIS — Z630 Problems in relationship with spouse or partner: Secondary | ICD-10-CM

## 2017-04-10 NOTE — Progress Notes (Signed)
   THERAPIST PROGRESS NOTE  Session Time: 10:05 AM to 11 AM  Participation Level: Active  Behavioral Response: CasualAlertEuthymic and Irritable  Type of Therapy: Individual Therapy  Treatment Goals addressed: patient will utilize therapy to help support him in gaining insight to effective coping skills to manage stressors  Interventions: Solution Focused, Strength-based, Supportive and Other: Healthy interpersonal skills, stress management skills  Summary: Jordan Bailey is a 51 y.o. male who presents with discussing ongoing stressors related to relationship. Has upcoming court dates related to these stressors. Relates that she has used the system and wants a back up plan and that patient is back up plan. Shared that her interferences have caused him significant setbacks and getting a job, and getting insurance and getting his surgery. Reviewed how he feels about the relationship and relates that he is done with the relationship, he had thought it was him and now realizing it isn't him, it was her manipulation all the time and sees how she never had the investment on her part in the marriage while he remained faithful and committed to the relationship. He stayed with the relationship because about the kids. He is ready to move on and to let go of this relationship. Relates insight about her including that she can't change, doesn't love herself or wouldn't be involved In the relationships that she is in currently. Relates her partner currently has a significant negative impact on how they are relating to each other with the kids. Describes his insight that she is "toxic" . Shares that he will continue to be a good example to kids, and to keep fighting and it will work out. Shared that before didn't feel right doing things but now better insight to take steps for his rights and protection. Completed treatment plan and patient feels that he benefits from therapy dealing with significant stressors and  helpful in gaining insight to effective coping.  Suicidal/Homicidal: No  Therapist Response: Reviewed patient's symptoms and help patient to process feelings around stressors including major stressor of relationship with wife. Help patient in problem solving, normalized his feelings and processed various strategies for dealing with stressors. Provided feedback to help reinforce patient's own insight to issues in relationship to recognize the negative impact it has had on him to help him and making healthy choices. Provided patient with ongoing emotional support and encouragement. Commended patient on his progress and reinforced the importance of client staying focused on his  own strengths and resources and resiliency. Completed treatment plan   Plan: Return again in 2 weeks.2. Therapist support patient in effective stress management skills and healthy interpersonal skills  Diagnosis: Axis I:  anxiety and depression, partner relationship problems, known medical problems    Axis II: No diagnosis    Jordan BreezeMary Bowman, LCSW 04/10/2017

## 2017-04-24 ENCOUNTER — Ambulatory Visit (INDEPENDENT_AMBULATORY_CARE_PROVIDER_SITE_OTHER): Payer: Medicare Other | Admitting: Licensed Clinical Social Worker

## 2017-04-24 DIAGNOSIS — F419 Anxiety disorder, unspecified: Secondary | ICD-10-CM | POA: Diagnosis not present

## 2017-04-24 DIAGNOSIS — Z789 Other specified health status: Secondary | ICD-10-CM

## 2017-04-24 DIAGNOSIS — Z630 Problems in relationship with spouse or partner: Secondary | ICD-10-CM

## 2017-04-24 DIAGNOSIS — F32A Depression, unspecified: Secondary | ICD-10-CM

## 2017-04-24 DIAGNOSIS — F329 Major depressive disorder, single episode, unspecified: Secondary | ICD-10-CM
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
## 2017-07-18 ENCOUNTER — Other Ambulatory Visit: Payer: Self-pay | Admitting: Sports Medicine

## 2017-08-08 DIAGNOSIS — M1612 Unilateral primary osteoarthritis, left hip: Secondary | ICD-10-CM | POA: Diagnosis not present

## 2017-08-08 DIAGNOSIS — M1611 Unilateral primary osteoarthritis, right hip: Secondary | ICD-10-CM | POA: Diagnosis not present

## 2017-08-16 ENCOUNTER — Other Ambulatory Visit: Payer: Self-pay | Admitting: Orthopedic Surgery

## 2017-08-17 ENCOUNTER — Other Ambulatory Visit: Payer: Self-pay | Admitting: Orthopedic Surgery

## 2017-08-21 NOTE — Pre-Procedure Instructions (Signed)
Daun PeacockRoger Corsello  08/21/2017      [id number] - [place], [place] - [address] [place] [id number] Phone: [phone] Fax: [phone]    Your procedure is scheduled on Monday, August 28, 2017  [facility] "A" at 12:30P.M.   Call this number if you have problems the morning of surgery:  [phone]   Remember:  Do not eat food or drink liquids after midnight.  Take these medicines the morning of surgery with A SIP OF WATER: None  As of today, stop taking all Aspirins, Vitamins, Fish oils, and Herbal medications. Also stop all NSAIDS i.e. Advil, Ibuprofen, Motrin, Aleve, Anaprox, Naproxen, BC and Goody Powders.    Do not wear jewelry.  Do not wear lotions, powders, colognes, or deodorant.  Do not shave 48 hours prior to surgery.  Men may shave face and neck.  Do not bring valuables to the hospital.  [facility] is not responsible for any belongings or valuables.  Contacts, dentures or bridgework may not be worn into surgery.  Leave your suitcase in the car.  After surgery it may be brought to your room.  For patients admitted to the hospital, discharge time will be determined by your treatment team.  Patients discharged the day of surgery will not be allowed to drive home.   Special instructions:   - Preparing For Surgery  Before surgery, you can play an important role. Because skin is not sterile, your skin needs to be as free of germs as possible. You can reduce the number of germs on your skin by washing with CHG (chlorahexidine gluconate) Soap before surgery.  CHG is an antiseptic cleaner which kills germs and bonds with the skin to continue killing germs even after washing.  Please do not use if you have an allergy to CHG or antibacterial soaps. If your skin becomes reddened/irritated stop using the CHG.  Do not shave (including legs and underarms) for at least 48 hours  prior to first CHG shower. It is OK to shave your face.  Please follow these instructions carefully.   1. Shower the NIGHT BEFORE SURGERY and the MORNING OF SURGERY with CHG.   2. If you chose to wash your hair, wash your hair first as usual with your normal shampoo.  3. After you shampoo, rinse your hair and body thoroughly to remove the shampoo.  4. Use CHG as you would any other liquid soap. You can apply CHG directly to the skin and wash gently with a scrungie or a clean washcloth.   5. Apply the CHG Soap to your body ONLY FROM THE NECK DOWN.  Do not use on open wounds or open sores. Avoid contact with your eyes, ears, mouth and genitals (private parts). Wash Face and genitals (private parts)  with your normal soap.  6. Wash thoroughly, paying special attention to the area where your surgery will be performed.  7. Thoroughly rinse your body with warm water from the neck down.  8. DO NOT shower/wash with your normal soap after using and rinsing off the CHG Soap.  9. Pat yourself dry with a CLEAN TOWEL.  10. Wear CLEAN PAJAMAS to bed the night before surgery, wear comfortable clothes the morning of surgery  11. Place CLEAN SHEETS on your bed the night of your first shower and DO NOT SLEEP WITH PETS.  Day of Surgery: Do not apply any deodorants/lotions. Please wear clean clothes  to the hospital/surgery center.    Please read over the following fact sheets that you were given. Pain Booklet, Coughing and Deep Breathing, Total Joint Packet, MRSA Information and Surgical Site Infection Prevention

## 2017-08-22 ENCOUNTER — Encounter (HOSPITAL_COMMUNITY)
Admission: RE | Admit: 2017-08-22 | Discharge: 2017-08-22 | Disposition: A | Discharge status: Home / Self Care | Payer: Medicare Other | Source: Ambulatory Visit | Attending: Orthopedic Surgery | Admitting: Orthopedic Surgery

## 2017-08-22 ENCOUNTER — Encounter (HOSPITAL_COMMUNITY): Payer: Self-pay

## 2017-08-22 ENCOUNTER — Other Ambulatory Visit: Payer: Self-pay

## 2017-08-22 DIAGNOSIS — Z01818 Encounter for other preprocedural examination: Secondary | ICD-10-CM | POA: Diagnosis not present

## 2017-08-22 DIAGNOSIS — Z79899 Other long term (current) drug therapy: Secondary | ICD-10-CM | POA: Insufficient documentation

## 2017-08-22 DIAGNOSIS — M19011 Primary osteoarthritis, right shoulder: Secondary | ICD-10-CM | POA: Insufficient documentation

## 2017-08-22 DIAGNOSIS — R001 Bradycardia, unspecified: Secondary | ICD-10-CM | POA: Diagnosis not present

## 2017-08-22 DIAGNOSIS — M1612 Unilateral primary osteoarthritis, left hip: Secondary | ICD-10-CM | POA: Diagnosis not present

## 2017-08-22 DIAGNOSIS — Z01810 Encounter for preprocedural cardiovascular examination: Secondary | ICD-10-CM | POA: Diagnosis not present

## 2017-08-22 DIAGNOSIS — M19012 Primary osteoarthritis, left shoulder: Secondary | ICD-10-CM | POA: Insufficient documentation

## 2017-08-22 DIAGNOSIS — Z7982 Long term (current) use of aspirin: Secondary | ICD-10-CM | POA: Insufficient documentation

## 2017-08-22 HISTORY — DX: Personal history of urinary calculi: Z87.442

## 2017-08-22 LAB — COMPREHENSIVE METABOLIC PANEL
ALK PHOS: 73 U/L (ref 38–126)
ALT: 11 U/L — ABNORMAL LOW (ref 17–63)
AST: 22 U/L (ref 15–41)
Albumin: 3.9 g/dL (ref 3.5–5.0)
Anion gap: 5 (ref 5–15)
BILIRUBIN TOTAL: 0.5 mg/dL (ref 0.3–1.2)
BUN: 10 mg/dL (ref 6–20)
CALCIUM: 9.4 mg/dL (ref 8.9–10.3)
CO2: 28 mmol/L (ref 22–32)
CREATININE: 0.86 mg/dL (ref 0.61–1.24)
Chloride: 104 mmol/L (ref 101–111)
Glucose, Bld: 90 mg/dL (ref 65–99)
Potassium: 4.2 mmol/L (ref 3.5–5.1)
Sodium: 137 mmol/L (ref 135–145)
Total Protein: 7.3 g/dL (ref 6.5–8.1)

## 2017-08-22 LAB — APTT: APTT: 33 s (ref 24–36)

## 2017-08-22 LAB — TYPE AND SCREEN
ABO/RH(D): O POS
Antibody Screen: NEGATIVE

## 2017-08-22 LAB — CBC WITH DIFFERENTIAL/PLATELET
BASOS ABS: 0 10*3/uL (ref 0.0–0.1)
BASOS PCT: 0 %
Eosinophils Absolute: 0 10*3/uL (ref 0.0–0.7)
Eosinophils Relative: 1 %
HEMATOCRIT: 40.5 % (ref 39.0–52.0)
HEMOGLOBIN: 13.3 g/dL (ref 13.0–17.0)
LYMPHS PCT: 55 %
Lymphs Abs: 1.8 10*3/uL (ref 0.7–4.0)
MCH: 25.7 pg — AB (ref 26.0–34.0)
MCHC: 32.8 g/dL (ref 30.0–36.0)
MCV: 78.2 fL (ref 78.0–100.0)
MONO ABS: 0.4 10*3/uL (ref 0.1–1.0)
Monocytes Relative: 11 %
NEUTROS PCT: 33 %
Neutro Abs: 1.1 10*3/uL — ABNORMAL LOW (ref 1.7–7.7)
Platelets: 171 10*3/uL (ref 150–400)
RBC: 5.18 MIL/uL (ref 4.22–5.81)
RDW: 14.9 % (ref 11.5–15.5)
WBC: 3.3 10*3/uL — ABNORMAL LOW (ref 4.0–10.5)

## 2017-08-22 LAB — URINALYSIS, ROUTINE W REFLEX MICROSCOPIC
Bilirubin Urine: NEGATIVE
GLUCOSE, UA: NEGATIVE mg/dL
HGB URINE DIPSTICK: NEGATIVE
KETONES UR: NEGATIVE mg/dL
Leukocytes, UA: NEGATIVE
Nitrite: NEGATIVE
PH: 7 (ref 5.0–8.0)
PROTEIN: NEGATIVE mg/dL
Specific Gravity, Urine: 1.017 (ref 1.005–1.030)

## 2017-08-22 LAB — PROTIME-INR
INR: 1.02
PROTHROMBIN TIME: 13.3 s (ref 11.4–15.2)

## 2017-08-22 LAB — SURGICAL PCR SCREEN
MRSA, PCR: NEGATIVE
Staphylococcus aureus: NEGATIVE

## 2017-08-22 NOTE — Progress Notes (Signed)
PCP: Dr. Clementeen GrahamEvan Corey Cardiologist: Denies  EKG: Today CXR: Today ECHO: Denies Stress Test: Denies Cardiac Cath: Denies  Pt light cigar smoker, educated no smoking 24 hours prior to surgery, verbalized understanding.  Patient denies shortness of breath, fever, cough, and chest pain at PAT appointment.  Patient verbalized understanding of instructions provided today at the PAT appointment.  Patient asked to review instructions at home and day of surgery.

## 2017-08-23 ENCOUNTER — Ambulatory Visit (INDEPENDENT_AMBULATORY_CARE_PROVIDER_SITE_OTHER): Payer: Medicare Other | Admitting: Family Medicine

## 2017-08-23 ENCOUNTER — Encounter: Payer: Self-pay | Admitting: Family Medicine

## 2017-08-23 ENCOUNTER — Telehealth: Payer: Self-pay | Admitting: Family Medicine

## 2017-08-23 VITALS — BP 106/62 | HR 69 | Temp 98.4°F | Resp 16 | Ht 71.0 in | Wt 211.1 lb

## 2017-08-23 DIAGNOSIS — Z9884 Bariatric surgery status: Secondary | ICD-10-CM | POA: Diagnosis not present

## 2017-08-23 DIAGNOSIS — M1612 Unilateral primary osteoarthritis, left hip: Secondary | ICD-10-CM | POA: Diagnosis not present

## 2017-08-23 NOTE — Telephone Encounter (Signed)
Patient is planning on having hip replacement surgery soon and his orthopedist will require surgical clearence.  Please have patient schedule with me for pre-surgical evaluation for safety.

## 2017-08-23 NOTE — Progress Notes (Signed)
Jordan Bailey is a 51 y.o. male who presents to [place]: [facility] today for presurgical evaluation.  Jordan Bailey is scheduled to have total hip replacement 6.  He is here today for surgical evaluation.  He feels well with no chest pain palpitations or shortness of breath.  He had labs and EKG yesterday for anesthesia..  The EKG was read as normal.   Past Medical History:  Diagnosis Date  . Arthritis   . History of kidney stones   . Iron deficiency anemia   . Kidney stones    Past Surgical History:  Procedure Laterality Date  . APPLICATION OF A-CELL OF BACK N/A 10/07/2015   Procedure: APPLICATION OF ACELL AND VAC DRESSING TO BACK WOUND;  Surgeon: Wallace Going, DO;  Location: [place];  Service: Plastics;  Laterality: N/A;  . COLONOSCOPY    . COSMETIC SURGERY     360 degree he had a 360 plastic surgery operation last week where excess skin was removed around his trunkhe had a 360 plastic surgery operation  excess skin was removed around his trunk  . INCISION AND DRAINAGE OF WOUND N/A 10/07/2015   Procedure: DEBRIDEMENT OF BACK WOUND;  Surgeon: Wallace Going, DO;  Location: [place];  Service: Plastics;  Laterality: N/A;  . JOINT REPLACEMENT Bilateral   . 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 REATTACHED TENDON Left 05/2011   LEFT LEG  . TOTAL KNEE ARTHROPLASTY Left 03/06/2015   Procedure: TOTAL KNEE ARTHROPLASTY;  Surgeon: Dorna Leitz, MD;  Location: [place];  Service: Orthopedics;  Laterality: Left;  . TOTAL KNEE ARTHROPLASTY Right 06/22/2015   Procedure: RIGHT TOTAL KNEE ARTHROPLASTY;  Surgeon: Dorna Leitz, MD;  Location: [place];  Service: Orthopedics;  Laterality: Right;   Social History   Tobacco Use  . Smoking status: Light Tobacco Smoker    Years: 26.00    Types: Cigars  . Smokeless tobacco: Former Network engineer Use  Topics  . Alcohol use: No    Alcohol/week: 0.0 oz    Frequency: Never   family history includes Diabetes in his mother.  ROS as above:  Medications: Current Outpatient Medications  Medication Sig Dispense Refill  . docusate sodium (COLACE) 100 MG capsule Take 100 mg by mouth daily.    . ferrous sulfate 325 (65 FE) MG tablet TAKE 1 TABLET BY MOUTH 3 TIMES A DAY WITH MEALS (Patient taking differently: Take 325 mg by mouth once daily) 30 tablet 5  . Multiple Vitamins-Minerals (MULTIVITAMIN WITH MINERALS) tablet Take 1 tablet by mouth daily.     No current facility-administered medications for this visit.    No Known Allergies  Health Maintenance Health Maintenance  Topic Date Due  . COLONOSCOPY  10/03/2016  . TETANUS/TDAP  07/08/2018 (Originally 10/03/1985)  . INFLUENZA VACCINE  08/23/2018 (Originally 05/03/2017)  . HIV Screening  Completed     Exam:  BP 106/62   Pulse 69   Temp 98.4 F (36.9 C) (Oral)   Resp 16   Ht 5' 11"  (1.803 m)   Wt 211 lb 1.9 oz (95.8 kg)   SpO2 100%   BMI 29.45 kg/m  Gen: Well NAD HEENT: EOMI,  MMM Lungs: Normal work of breathing. CTABL Heart: RRR no MRG Abd: NABS, Soft. Nondistended, Nontender Exts: Brisk capillary refill, warm and well perfused.    Results for orders placed or performed during the hospital encounter of 08/22/17 (from the  past 72 hour(s))  Urinalysis, Routine w reflex microscopic     Status: None   Collection Time: 08/22/17  8:57 AM  Result Value Ref Range   Color, Urine YELLOW YELLOW   APPearance CLEAR CLEAR   Specific Gravity, Urine 1.017 1.005 - 1.030   pH 7.0 5.0 - 8.0   Glucose, UA NEGATIVE NEGATIVE mg/dL   Hgb urine dipstick NEGATIVE NEGATIVE   Bilirubin Urine NEGATIVE NEGATIVE   Ketones, ur NEGATIVE NEGATIVE mg/dL   Protein, ur NEGATIVE NEGATIVE mg/dL   Nitrite NEGATIVE NEGATIVE   Leukocytes, UA NEGATIVE NEGATIVE  APTT     Status: None   Collection Time: 08/22/17  8:58 AM  Result Value Ref Range   aPTT 33 24  - 36 seconds  CBC WITH DIFFERENTIAL     Status: Abnormal   Collection Time: 08/22/17  8:58 AM  Result Value Ref Range   WBC 3.3 (L) 4.0 - 10.5 K/uL   RBC 5.18 4.22 - 5.81 MIL/uL   Hemoglobin 13.3 13.0 - 17.0 g/dL   HCT 40.5 39.0 - 52.0 %   MCV 78.2 78.0 - 100.0 fL   MCH 25.7 (L) 26.0 - 34.0 pg   MCHC 32.8 30.0 - 36.0 g/dL   RDW 14.9 11.5 - 15.5 %   Platelets 171 150 - 400 K/uL   Neutrophils Relative % 33 %   Neutro Abs 1.1 (L) 1.7 - 7.7 K/uL   Lymphocytes Relative 55 %   Lymphs Abs 1.8 0.7 - 4.0 K/uL   Monocytes Relative 11 %   Monocytes Absolute 0.4 0.1 - 1.0 K/uL   Eosinophils Relative 1 %   Eosinophils Absolute 0.0 0.0 - 0.7 K/uL   Basophils Relative 0 %   Basophils Absolute 0.0 0.0 - 0.1 K/uL  Comprehensive metabolic panel     Status: Abnormal   Collection Time: 08/22/17  8:58 AM  Result Value Ref Range   Sodium 137 135 - 145 mmol/L   Potassium 4.2 3.5 - 5.1 mmol/L   Chloride 104 101 - 111 mmol/L   CO2 28 22 - 32 mmol/L   Glucose, Bld 90 65 - 99 mg/dL   BUN 10 6 - 20 mg/dL   Creatinine, Ser 0.86 0.61 - 1.24 mg/dL   Calcium 9.4 8.9 - 10.3 mg/dL   Total Protein 7.3 6.5 - 8.1 g/dL   Albumin 3.9 3.5 - 5.0 g/dL   AST 22 15 - 41 U/L   ALT 11 (L) 17 - 63 U/L   Alkaline Phosphatase 73 38 - 126 U/L   Total Bilirubin 0.5 0.3 - 1.2 mg/dL   GFR calc non Af Amer >60 >60 mL/min   GFR calc Af Amer >60 >60 mL/min    Comment: (NOTE) The eGFR has been calculated using the CKD EPI equation. This calculation has not been validated in all clinical situations. eGFR's persistently <60 mL/min signify possible Chronic Kidney Disease.    Anion gap 5 5 - 15  Protime-INR     Status: None   Collection Time: 08/22/17  8:58 AM  Result Value Ref Range   Prothrombin Time 13.3 11.4 - 15.2 seconds   INR 1.02   Surgical pcr screen     Status: None   Collection Time: 08/22/17  8:58 AM  Result Value Ref Range   MRSA, PCR NEGATIVE NEGATIVE   Staphylococcus aureus NEGATIVE NEGATIVE    Comment:  (NOTE) The Xpert SA Assay (FDA approved for NASAL specimens in patients 27 years of age and older),  is one component of a comprehensive surveillance program. It is not intended to diagnose infection nor to guide or monitor treatment.   Type and screen Order type and screen if day of surgery is less than 15 days from draw of preadmission visit or order morning of surgery if day of surgery is greater than 6 days from preadmission visit.     Status: None   Collection Time: 08/22/17  9:10 AM  Result Value Ref Range   ABO/RH(D) O POS    Antibody Screen NEG    Sample Expiration 09/05/2017    Extend sample reason NO TRANSFUSIONS OR PREGNANCY IN THE PAST 3 MONTHS    Dg Chest 2 View  Result Date: 08/22/2017 CLINICAL DATA:  Preop for left hip arthroplasty EXAM: CHEST  2 VIEW COMPARISON:  Chest x-ray of 02/23/2015 FINDINGS: The lungs remain clear and somewhat hyperaerated. Mediastinal and hilar contours are unremarkable. The heart is within normal limits in size. No acute bony abnormality is seen. Considerable degenerative change involves both shoulders. IMPRESSION: 1. No active cardiopulmonary disease.  Slight hyper aeration. 2. Considerable degenerative change of both shoulders. Electronically Signed   By: Ivar Drape M.D.   On: 08/22/2017 11:32   Twelve-lead EKG reviewed   Assessment and Plan: 51 y.o. male with presurgical evaluation doing well.  I do not think there are any medical problems that would preclude [place] from having surgery Monday.  From my perspective he is optimized for surgery and low risk.  Recheck with me as needed.   No orders of the defined types were placed in this encounter.  No orders of the defined types were placed in this encounter.    Discussed warning signs or symptoms. Please see discharge instructions. Patient expresses understanding.  I spent 15 minutes with this patient, greater than 50% was face-to-face time counseling regarding medical plan following  surgery and surgical risk.   CC: Dr Berenice Primas

## 2017-08-23 NOTE — Telephone Encounter (Signed)
Patient advised and scheduled.  

## 2017-08-23 NOTE — Patient Instructions (Signed)
Thank you for coming in today. Let me know how you are doing after surgery.

## 2017-08-25 MED ORDER — TRANEXAMIC ACID 1000 MG/10ML IV SOLN
2000.0000 mg | INTRAVENOUS | Status: AC
  Administered 2017-08-28: 2000 mg via TOPICAL
  Filled 2017-08-25: qty 20

## 2017-08-28 ENCOUNTER — Inpatient Hospital Stay (HOSPITAL_COMMUNITY): Payer: Medicare Other

## 2017-08-28 ENCOUNTER — Inpatient Hospital Stay (HOSPITAL_COMMUNITY): Payer: Medicare Other | Admitting: Anesthesiology

## 2017-08-28 ENCOUNTER — Inpatient Hospital Stay (HOSPITAL_COMMUNITY)
Admission: RE | Admit: 2017-08-28 | Discharge: 2017-08-29 | DRG: 470 | Disposition: A | Discharge status: Home Health | Payer: Medicare Other | Source: Ambulatory Visit | Attending: Orthopedic Surgery | Admitting: Orthopedic Surgery

## 2017-08-28 ENCOUNTER — Encounter (HOSPITAL_COMMUNITY)
Admission: RE | Disposition: A | Discharge status: Home Health | Payer: Self-pay | Source: Ambulatory Visit | Attending: Orthopedic Surgery

## 2017-08-28 ENCOUNTER — Encounter (HOSPITAL_COMMUNITY): Payer: Self-pay | Admitting: *Deleted

## 2017-08-28 DIAGNOSIS — M247 Protrusio acetabuli: Secondary | ICD-10-CM | POA: Diagnosis not present

## 2017-08-28 DIAGNOSIS — Z833 Family history of diabetes mellitus: Secondary | ICD-10-CM

## 2017-08-28 DIAGNOSIS — Z96653 Presence of artificial knee joint, bilateral: Secondary | ICD-10-CM | POA: Diagnosis present

## 2017-08-28 DIAGNOSIS — Z9884 Bariatric surgery status: Secondary | ICD-10-CM | POA: Diagnosis not present

## 2017-08-28 DIAGNOSIS — Z96641 Presence of right artificial hip joint: Secondary | ICD-10-CM | POA: Diagnosis not present

## 2017-08-28 DIAGNOSIS — M1612 Unilateral primary osteoarthritis, left hip: Principal | ICD-10-CM | POA: Diagnosis present

## 2017-08-28 DIAGNOSIS — Z419 Encounter for procedure for purposes other than remedying health state, unspecified: Secondary | ICD-10-CM

## 2017-08-28 DIAGNOSIS — M25752 Osteophyte, left hip: Secondary | ICD-10-CM | POA: Diagnosis present

## 2017-08-28 DIAGNOSIS — D509 Iron deficiency anemia, unspecified: Secondary | ICD-10-CM | POA: Diagnosis present

## 2017-08-28 DIAGNOSIS — Z79899 Other long term (current) drug therapy: Secondary | ICD-10-CM | POA: Diagnosis not present

## 2017-08-28 DIAGNOSIS — Z87442 Personal history of urinary calculi: Secondary | ICD-10-CM

## 2017-08-28 DIAGNOSIS — Z471 Aftercare following joint replacement surgery: Secondary | ICD-10-CM | POA: Diagnosis not present

## 2017-08-28 DIAGNOSIS — F17290 Nicotine dependence, other tobacco product, uncomplicated: Secondary | ICD-10-CM | POA: Diagnosis present

## 2017-08-28 DIAGNOSIS — D62 Acute posthemorrhagic anemia: Secondary | ICD-10-CM | POA: Diagnosis not present

## 2017-08-28 DIAGNOSIS — E291 Testicular hypofunction: Secondary | ICD-10-CM | POA: Diagnosis present

## 2017-08-28 DIAGNOSIS — E559 Vitamin D deficiency, unspecified: Secondary | ICD-10-CM | POA: Diagnosis present

## 2017-08-28 DIAGNOSIS — M170 Bilateral primary osteoarthritis of knee: Secondary | ICD-10-CM | POA: Diagnosis not present

## 2017-08-28 DIAGNOSIS — Z96642 Presence of left artificial hip joint: Secondary | ICD-10-CM

## 2017-08-28 HISTORY — PX: TOTAL HIP ARTHROPLASTY: SHX124

## 2017-08-28 SURGERY — ARTHROPLASTY, HIP, TOTAL, ANTERIOR APPROACH
Anesthesia: Spinal | Site: Hip | Laterality: Left

## 2017-08-28 MED ORDER — BUPIVACAINE LIPOSOME 1.3 % IJ SUSP
INTRAMUSCULAR | Status: DC | PRN
  Administered 2017-08-28: 20 mL

## 2017-08-28 MED ORDER — CHLORHEXIDINE GLUCONATE 4 % EX LIQD: 60.0000 mL | Freq: Once | CUTANEOUS | Status: DC

## 2017-08-28 MED ORDER — FERROUS SULFATE 325 (65 FE) MG PO TABS
325.0000 mg | ORAL_TABLET | Freq: Every day | ORAL | Status: DC
  Administered 2017-08-29: 325 mg via ORAL
  Filled 2017-08-28: qty 1

## 2017-08-28 MED ORDER — PROPOFOL 10 MG/ML IV BOLUS
INTRAVENOUS | Status: DC | PRN
  Administered 2017-08-28: 30 mg via INTRAVENOUS

## 2017-08-28 MED ORDER — ALBUMIN HUMAN 5 % IV SOLN
INTRAVENOUS | Status: DC | PRN
  Administered 2017-08-28: 17:00:00 via INTRAVENOUS

## 2017-08-28 MED ORDER — ACETAMINOPHEN 650 MG RE SUPP
650.0000 mg | RECTAL | Status: DC | PRN
Start: 2017-08-28 — End: 2017-08-29

## 2017-08-28 MED ORDER — OXYCODONE-ACETAMINOPHEN 5-325 MG PO TABS: 1.0000 | ORAL_TABLET | Freq: Four times a day (QID) | ORAL | 0 refills | Status: DC | PRN

## 2017-08-28 MED ORDER — ZOLPIDEM TARTRATE 5 MG PO TABS: 5.0000 mg | ORAL_TABLET | Freq: Every evening | ORAL | Status: DC | PRN

## 2017-08-28 MED ORDER — PROPOFOL 10 MG/ML IV BOLUS
INTRAVENOUS | Status: AC
  Filled 2017-08-28: qty 20

## 2017-08-28 MED ORDER — MIDAZOLAM HCL 2 MG/2ML IJ SOLN
INTRAMUSCULAR | Status: DC | PRN
  Administered 2017-08-28: 2 mg via INTRAVENOUS

## 2017-08-28 MED ORDER — CELECOXIB 200 MG PO CAPS
200.0000 mg | ORAL_CAPSULE | Freq: Two times a day (BID) | ORAL | Status: DC
  Administered 2017-08-28 – 2017-08-29 (×2): 200 mg via ORAL
  Filled 2017-08-28 (×2): qty 1

## 2017-08-28 MED ORDER — EPHEDRINE SULFATE 50 MG/ML IJ SOLN
INTRAMUSCULAR | Status: DC | PRN
  Administered 2017-08-28 (×2): 10 mg via INTRAVENOUS

## 2017-08-28 MED ORDER — METHOCARBAMOL 1000 MG/10ML IJ SOLN: 500.0000 mg | Freq: Four times a day (QID) | INTRAVENOUS | Status: DC | PRN

## 2017-08-28 MED ORDER — GABAPENTIN 300 MG PO CAPS
300.0000 mg | ORAL_CAPSULE | Freq: Two times a day (BID) | ORAL | Status: DC
  Administered 2017-08-28 – 2017-08-29 (×2): 300 mg via ORAL
  Filled 2017-08-28 (×2): qty 1

## 2017-08-28 MED ORDER — BUPIVACAINE LIPOSOME 1.3 % IJ SUSP
20.0000 mL | Freq: Once | INTRAMUSCULAR | Status: DC
  Filled 2017-08-28: qty 20

## 2017-08-28 MED ORDER — 0.9 % SODIUM CHLORIDE (POUR BTL) OPTIME
TOPICAL | Status: DC | PRN
  Administered 2017-08-28: 1000 mL

## 2017-08-28 MED ORDER — TRANEXAMIC ACID 1000 MG/10ML IV SOLN
1000.0000 mg | INTRAVENOUS | Status: AC
  Administered 2017-08-28: 1000 mg via INTRAVENOUS
  Filled 2017-08-28: qty 10

## 2017-08-28 MED ORDER — HYDROMORPHONE HCL 1 MG/ML IJ SOLN
INTRAMUSCULAR | Status: AC
  Administered 2017-08-28: 0.5 mg via INTRAVENOUS
  Filled 2017-08-28: qty 1

## 2017-08-28 MED ORDER — TRANEXAMIC ACID 1000 MG/10ML IV SOLN
1000.0000 mg | Freq: Once | INTRAVENOUS | Status: AC
  Administered 2017-08-28: 1000 mg via INTRAVENOUS
  Filled 2017-08-28: qty 10

## 2017-08-28 MED ORDER — METHOCARBAMOL 500 MG PO TABS: 500.0000 mg | ORAL_TABLET | Freq: Four times a day (QID) | ORAL | Status: DC | PRN

## 2017-08-28 MED ORDER — POLYETHYLENE GLYCOL 3350 17 G PO PACK: 17.0000 g | PACK | Freq: Every day | ORAL | Status: DC | PRN

## 2017-08-28 MED ORDER — ASPIRIN EC 325 MG PO TBEC: 325.0000 mg | DELAYED_RELEASE_TABLET | Freq: Two times a day (BID) | ORAL | 0 refills | Status: DC

## 2017-08-28 MED ORDER — ACETAMINOPHEN 325 MG PO TABS: 650.0000 mg | ORAL_TABLET | ORAL | Status: DC | PRN

## 2017-08-28 MED ORDER — BUPIVACAINE IN DEXTROSE 0.75-8.25 % IT SOLN
INTRATHECAL | Status: DC | PRN
  Administered 2017-08-28: 2 mL via INTRATHECAL

## 2017-08-28 MED ORDER — OXYCODONE HCL 5 MG PO TABS: 5.0000 mg | ORAL_TABLET | ORAL | Status: DC | PRN

## 2017-08-28 MED ORDER — BUPIVACAINE HCL 0.5 % IJ SOLN
INTRAMUSCULAR | Status: DC | PRN
  Administered 2017-08-28: 20 mL

## 2017-08-28 MED ORDER — LACTATED RINGERS IV SOLN
INTRAVENOUS | Status: DC
  Administered 2017-08-28 (×4): via INTRAVENOUS

## 2017-08-28 MED ORDER — DOCUSATE SODIUM 100 MG PO CAPS
100.0000 mg | ORAL_CAPSULE | Freq: Two times a day (BID) | ORAL | Status: DC
  Administered 2017-08-28 – 2017-08-29 (×2): 100 mg via ORAL
  Filled 2017-08-28 (×2): qty 1

## 2017-08-28 MED ORDER — TIZANIDINE HCL 2 MG PO TABS: 2.0000 mg | ORAL_TABLET | Freq: Three times a day (TID) | ORAL | 0 refills | Status: DC | PRN

## 2017-08-28 MED ORDER — CEFAZOLIN SODIUM-DEXTROSE 2-4 GM/100ML-% IV SOLN
INTRAVENOUS | Status: AC
  Filled 2017-08-28: qty 100

## 2017-08-28 MED ORDER — SODIUM CHLORIDE 0.9 % IV SOLN
INTRAVENOUS | Status: DC
  Administered 2017-08-28: 21:00:00 via INTRAVENOUS

## 2017-08-28 MED ORDER — BUPIVACAINE HCL (PF) 0.5 % IJ SOLN
INTRAMUSCULAR | Status: AC
  Filled 2017-08-28: qty 30

## 2017-08-28 MED ORDER — PROPOFOL 500 MG/50ML IV EMUL
INTRAVENOUS | Status: DC | PRN
  Administered 2017-08-28: 100 ug/kg/min via INTRAVENOUS

## 2017-08-28 MED ORDER — DIPHENHYDRAMINE HCL 12.5 MG/5ML PO ELIX: 12.5000 mg | ORAL_SOLUTION | ORAL | Status: DC | PRN

## 2017-08-28 MED ORDER — HYDROMORPHONE HCL 1 MG/ML IJ SOLN
0.2500 mg | INTRAMUSCULAR | Status: DC | PRN
  Administered 2017-08-28 (×2): 0.5 mg via INTRAVENOUS

## 2017-08-28 MED ORDER — BISACODYL 5 MG PO TBEC: 5.0000 mg | DELAYED_RELEASE_TABLET | Freq: Every day | ORAL | Status: DC | PRN

## 2017-08-28 MED ORDER — DEXAMETHASONE SODIUM PHOSPHATE 10 MG/ML IJ SOLN
10.0000 mg | Freq: Two times a day (BID) | INTRAMUSCULAR | Status: AC
  Administered 2017-08-28 – 2017-08-29 (×2): 10 mg via INTRAVENOUS
  Filled 2017-08-28 (×2): qty 1

## 2017-08-28 MED ORDER — ADULT MULTIVITAMIN W/MINERALS CH
1.0000 | ORAL_TABLET | Freq: Every day | ORAL | Status: DC
  Administered 2017-08-29: 1 via ORAL
  Filled 2017-08-28: qty 1

## 2017-08-28 MED ORDER — MIDAZOLAM HCL 2 MG/2ML IJ SOLN
INTRAMUSCULAR | Status: AC
  Filled 2017-08-28: qty 2

## 2017-08-28 MED ORDER — ASPIRIN EC 325 MG PO TBEC
325.0000 mg | DELAYED_RELEASE_TABLET | Freq: Two times a day (BID) | ORAL | Status: DC
  Administered 2017-08-29: 325 mg via ORAL
  Filled 2017-08-28: qty 1

## 2017-08-28 MED ORDER — PHENYLEPHRINE HCL 10 MG/ML IJ SOLN
INTRAMUSCULAR | Status: DC | PRN
  Administered 2017-08-28 (×4): 80 ug via INTRAVENOUS

## 2017-08-28 MED ORDER — LIDOCAINE HCL (CARDIAC) 20 MG/ML IV SOLN
INTRAVENOUS | Status: DC | PRN
  Administered 2017-08-28: 100 mg via INTRAVENOUS

## 2017-08-28 MED ORDER — ALUM & MAG HYDROXIDE-SIMETH 200-200-20 MG/5ML PO SUSP: 30.0000 mL | ORAL | Status: DC | PRN

## 2017-08-28 MED ORDER — CEFAZOLIN SODIUM-DEXTROSE 2-4 GM/100ML-% IV SOLN
2.0000 g | INTRAVENOUS | Status: AC
  Administered 2017-08-28: 2 g via INTRAVENOUS

## 2017-08-28 MED ORDER — HYDROMORPHONE HCL 1 MG/ML IJ SOLN: 0.5000 mg | INTRAMUSCULAR | Status: DC | PRN

## 2017-08-28 MED ORDER — GLYCOPYRROLATE 0.2 MG/ML IJ SOLN
INTRAMUSCULAR | Status: DC | PRN
  Administered 2017-08-28: 0.2 mg via INTRAVENOUS

## 2017-08-28 MED ORDER — ONDANSETRON HCL 4 MG PO TABS: 4.0000 mg | ORAL_TABLET | Freq: Four times a day (QID) | ORAL | Status: DC | PRN

## 2017-08-28 MED ORDER — ONDANSETRON HCL 4 MG/2ML IJ SOLN: 4.0000 mg | Freq: Four times a day (QID) | INTRAMUSCULAR | Status: DC | PRN

## 2017-08-28 MED ORDER — MAGNESIUM CITRATE PO SOLN: 1.0000 | Freq: Once | ORAL | Status: DC | PRN

## 2017-08-28 MED ORDER — CEFAZOLIN SODIUM-DEXTROSE 2-4 GM/100ML-% IV SOLN
2.0000 g | Freq: Four times a day (QID) | INTRAVENOUS | Status: AC
  Administered 2017-08-28 – 2017-08-29 (×2): 2 g via INTRAVENOUS
  Filled 2017-08-28 (×2): qty 100

## 2017-08-28 MED ORDER — FENTANYL CITRATE (PF) 250 MCG/5ML IJ SOLN
INTRAMUSCULAR | Status: AC
  Filled 2017-08-28: qty 5

## 2017-08-28 SURGICAL SUPPLY — 56 items
BENZOIN TINCTURE PRP APPL 2/3 (GAUZE/BANDAGES/DRESSINGS) ×2 IMPLANT
BLADE CLIPPER SURG (BLADE) IMPLANT
BNDG COHESIVE 6X5 TAN STRL LF (GAUZE/BANDAGES/DRESSINGS) IMPLANT
BNDG GAUZE ELAST 4 BULKY (GAUZE/BANDAGES/DRESSINGS) IMPLANT
CAPT HIP TOTAL 2 ×2 IMPLANT
CELLS DAT CNTRL 66122 CELL SVR (MISCELLANEOUS) ×1 IMPLANT
CLSR STERI-STRIP ANTIMIC 1/2X4 (GAUZE/BANDAGES/DRESSINGS) IMPLANT
COVER PERINEAL POST (MISCELLANEOUS) ×2 IMPLANT
COVER SURGICAL LIGHT HANDLE (MISCELLANEOUS) ×2 IMPLANT
DRAPE C-ARM 42X72 X-RAY (DRAPES) ×2 IMPLANT
DRAPE STERI IOBAN 125X83 (DRAPES) ×2 IMPLANT
DRAPE U-SHAPE 47X51 STRL (DRAPES) ×4 IMPLANT
DRESSING AQUACEL AG SP 3.5X10 (GAUZE/BANDAGES/DRESSINGS) IMPLANT
DRSG AQUACEL AG ADV 3.5X10 (GAUZE/BANDAGES/DRESSINGS) ×2 IMPLANT
DRSG AQUACEL AG SP 3.5X10 (GAUZE/BANDAGES/DRESSINGS)
DURAPREP 26ML APPLICATOR (WOUND CARE) ×2 IMPLANT
ELECT BLADE 4.0 EZ CLEAN MEGAD (MISCELLANEOUS)
ELECT CAUTERY BLADE 6.4 (BLADE) ×2 IMPLANT
ELECT REM PT RETURN 9FT ADLT (ELECTROSURGICAL) ×2
ELECTRODE BLDE 4.0 EZ CLN MEGD (MISCELLANEOUS) IMPLANT
ELECTRODE REM PT RTRN 9FT ADLT (ELECTROSURGICAL) ×1 IMPLANT
GLOVE BIOGEL PI IND STRL 8 (GLOVE) ×2 IMPLANT
GLOVE BIOGEL PI INDICATOR 8 (GLOVE) ×2
GLOVE ECLIPSE 7.5 STRL STRAW (GLOVE) ×4 IMPLANT
GOWN STRL REUS W/ TWL LRG LVL3 (GOWN DISPOSABLE) ×2 IMPLANT
GOWN STRL REUS W/ TWL XL LVL3 (GOWN DISPOSABLE) ×2 IMPLANT
GOWN STRL REUS W/TWL LRG LVL3 (GOWN DISPOSABLE) ×2
GOWN STRL REUS W/TWL XL LVL3 (GOWN DISPOSABLE) ×2
HOOD PEEL AWAY FACE SHEILD DIS (HOOD) ×4 IMPLANT
KIT BASIN OR (CUSTOM PROCEDURE TRAY) ×2 IMPLANT
KIT ROOM TURNOVER OR (KITS) ×2 IMPLANT
MANIFOLD NEPTUNE II (INSTRUMENTS) ×2 IMPLANT
NEEDLE SPNL 22GX3.5 QUINCKE BK (NEEDLE) ×2 IMPLANT
NS IRRIG 1000ML POUR BTL (IV SOLUTION) ×2 IMPLANT
PACK TOTAL JOINT (CUSTOM PROCEDURE TRAY) ×2 IMPLANT
PACK UNIVERSAL I (CUSTOM PROCEDURE TRAY) ×2 IMPLANT
PAD ARMBOARD 7.5X6 YLW CONV (MISCELLANEOUS) ×4 IMPLANT
RTRCTR WOUND ALEXIS 18CM MED (MISCELLANEOUS) ×2
RTRCTR WOUND ALEXIS 18CM SML (INSTRUMENTS) ×2
SAVER CELL AAL HAEMONETICS (INSTRUMENTS) ×1 IMPLANT
SAW OSC TIP CART 19.5X105X1.3 (SAW) ×2 IMPLANT
SPONGE LAP 18X18 X RAY DECT (DISPOSABLE) IMPLANT
STAPLER VISISTAT 35W (STAPLE) ×2 IMPLANT
SUT ETHIBOND NAB CT1 #1 30IN (SUTURE) ×4 IMPLANT
SUT MNCRL AB 3-0 PS2 18 (SUTURE) IMPLANT
SUT VIC AB 0 CT1 27 (SUTURE) ×1
SUT VIC AB 0 CT1 27XBRD ANBCTR (SUTURE) ×1 IMPLANT
SUT VIC AB 1 CT1 27 (SUTURE) ×2
SUT VIC AB 1 CT1 27XBRD ANBCTR (SUTURE) ×2 IMPLANT
SUT VIC AB 2-0 CT1 27 (SUTURE) ×1
SUT VIC AB 2-0 CT1 TAPERPNT 27 (SUTURE) ×1 IMPLANT
SYR 50ML LL SCALE MARK (SYRINGE) ×2 IMPLANT
TOWEL OR 17X24 6PK STRL BLUE (TOWEL DISPOSABLE) ×2 IMPLANT
TOWEL OR 17X26 10 PK STRL BLUE (TOWEL DISPOSABLE) ×2 IMPLANT
TRAY CATH 16FR W/PLASTIC CATH (SET/KITS/TRAYS/PACK) IMPLANT
TRAY FOLEY CATH SILVER 16FR (SET/KITS/TRAYS/PACK) IMPLANT

## 2017-08-28 NOTE — Transfer of Care (Signed)
Immediate Anesthesia Transfer of Care Note  Patient: Daun PeacockRoger Deviney  Procedure(s) Performed: TOTAL HIP ARTHROPLASTY ANTERIOR APPROACH (Left Hip)  Patient Location: PACU  Anesthesia Type:Spinal  Level of Consciousness: awake, alert  and oriented  Airway & Oxygen Therapy: Patient Spontanous Breathing and Patient connected to face mask oxygen  Post-op Assessment: Report given to RN and Post -op Vital signs reviewed and stable  Post vital signs: Reviewed and stable  Last Vitals:  Vitals:   08/28/17 1236 08/28/17 1749  BP: 126/86   Pulse: (!) 54   Resp: 16   Temp: 36.7 C 36.5 C  SpO2: 100%     Last Pain:  Vitals:   08/28/17 1749  TempSrc:   PainSc: 10-Worst pain ever      Patients Stated Pain Goal: 4 (08/28/17 1241)  Complications: No apparent anesthesia complications

## 2017-08-28 NOTE — Discharge Instructions (Signed)

## 2017-08-28 NOTE — Anesthesia Preprocedure Evaluation (Addendum)
Anesthesia Evaluation  Patient identified by MRN, date of birth, ID band Patient awake    Reviewed: Allergy & Precautions, NPO status , Patient's Chart, lab work & pertinent test results  Airway Mallampati: II  TM Distance: >3 FB Neck ROM: Full    Dental  (+) Dental Advisory Given   Pulmonary Current Smoker,    breath sounds clear to auscultation       Cardiovascular negative cardio ROS   Rhythm:Regular Rate:Normal     Neuro/Psych negative neurological ROS     GI/Hepatic Neg liver ROS, S/p Gastric sleeve   Endo/Other  negative endocrine ROS  Renal/GU negative Renal ROS     Musculoskeletal  (+) Arthritis ,   Abdominal   Peds  Hematology negative hematology ROS (+)   Anesthesia Other Findings   Reproductive/Obstetrics                            Lab Results  Component Value Date   WBC 3.3 (L) 08/22/2017   HGB 13.3 08/22/2017   HCT 40.5 08/22/2017   MCV 78.2 08/22/2017   PLT 171 08/22/2017   Lab Results  Component Value Date   CREATININE 0.86 08/22/2017   BUN 10 08/22/2017   NA 137 08/22/2017   K 4.2 08/22/2017   CL 104 08/22/2017   CO2 28 08/22/2017   Lab Results  Component Value Date   INR 1.02 08/22/2017   INR 1.12 06/11/2015   INR 1.12 02/23/2015    Anesthesia Physical Anesthesia Plan  ASA: II  Anesthesia Plan: Spinal   Post-op Pain Management:    Induction: Intravenous  PONV Risk Score and Plan: 1 and Ondansetron, Propofol infusion and Treatment may vary due to age or medical condition  Airway Management Planned: Natural Airway and Simple Face Mask  Additional Equipment:   Intra-op Plan:   Post-operative Plan:   Informed Consent: I have reviewed the patients History and Physical, chart, labs and discussed the procedure including the risks, benefits and alternatives for the proposed anesthesia with the patient or authorized representative who has  indicated his/her understanding and acceptance.     Plan Discussed with: CRNA  Anesthesia Plan Comments:         Anesthesia Quick Evaluation

## 2017-08-28 NOTE — Anesthesia Procedure Notes (Signed)
Spinal  Patient location during procedure: OR Start time: 08/28/2017 3:03 PM End time: 08/28/2017 3:08 PM Staffing Anesthesiologist: Lowella CurbMiller, Warren Ray, MD Performed: anesthesiologist  Preanesthetic Checklist Completed: patient identified, site marked, surgical consent, pre-op evaluation, timeout performed, IV checked, risks and benefits discussed and monitors and equipment checked Spinal Block Patient position: sitting Prep: Betadine Patient monitoring: heart rate, cardiac monitor, continuous pulse ox and blood pressure Approach: midline Location: L3-4 Injection technique: single-shot Needle Needle type: Pencan  Needle gauge: 24 G

## 2017-08-28 NOTE — H&P (Signed)
TOTAL HIP ADMISSION H&P  Patient is admitted for bilaterally total hip arthroplasty.  Subjective:  Chief Complaint: bilaterally hip pain  HPI: Jordan Bailey, 51 y.o. male, has a history of pain and functional disability in the bilaterally hip(s) due to arthritis and patient has failed non-surgical conservative treatments for greater than 12 weeks to include NSAID's and/or analgesics, viscosupplementation injections, weight reduction as appropriate and activity modification.  Onset of symptoms was gradual starting 5 years ago with gradually worsening course since that time.The patient noted no past surgery on the bilaterally hip(s).  Patient currently rates pain in the left hip at 10 out of 10 with activity. Patient has night pain, worsening of pain with activity and weight bearing, trendelenberg gait, pain that interfers with activities of daily living, pain with passive range of motion, crepitus and joint swelling. 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 This condition presents safety issues increasing the risk of falls. This patient has had failure of all reasonable conservative care.  There is no current active infection.  Patient Active Problem List   Diagnosis Date Noted  . Excessive and redundant skin and subcutaneous tissue 11/08/2016  . Vitamin D deficiency 07/11/2016  . Hypogonadism in male 08/17/2015  . Primary osteoarthritis of right knee 06/22/2015  . Primary osteoarthritis of left knee 03/06/2015  . Obesity 03/06/2015  . Anemia 01/12/2015  . Bariatric surgery status 01/12/2015  . Hammer toe of left foot 12/11/2014  . Osteoarthritis of left hip 12/11/2014  . Osteoarthritis of both knees 11/28/2014  . Rupture of left quadriceps muscle post repair 11/28/2014   Past Medical History:  Diagnosis Date  . Arthritis   . History of kidney stones   . Iron  deficiency anemia   . Kidney stones     Past Surgical History:  Procedure Laterality Date  . APPLICATION OF A-CELL OF BACK N/A 10/07/2015   Procedure: APPLICATION OF ACELL AND VAC DRESSING TO BACK WOUND;  Surgeon: Wallace Going, DO;  Location: [facility];  Service: Plastics;  Laterality: N/A;  . COLONOSCOPY    . COSMETIC SURGERY     360 degree he had a 360 plastic surgery operation last week where excess skin was removed around his trunkhe had a 360 plastic surgery operation  excess skin was removed around his trunk  . INCISION AND DRAINAGE OF WOUND N/A 10/07/2015   Procedure: DEBRIDEMENT OF BACK WOUND;  Surgeon: Wallace Going, DO;  Location: [facility];  Service: Plastics;  Laterality: N/A;  . JOINT REPLACEMENT Bilateral   . 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 REATTACHED TENDON Left 05/2011   LEFT LEG  . TOTAL KNEE ARTHROPLASTY Left 03/06/2015   Procedure: TOTAL KNEE ARTHROPLASTY;  Surgeon: Dorna Leitz, MD;  Location: [facility];  Service: Orthopedics;  Laterality: Left;  . TOTAL KNEE ARTHROPLASTY Right 06/22/2015   Procedure: RIGHT TOTAL KNEE ARTHROPLASTY;  Surgeon: Dorna Leitz, MD;  Location: [facility];  Service: Orthopedics;  Laterality: Right;    Current Facility-Administered Medications  Medication Dose Route Frequency Provider Last Rate Last Dose  . tranexamic acid (CYKLOKAPRON) 2,000 mg in sodium chloride 0.9 % 50 mL Topical Application  4,401 mg Topical To OR Dorna Leitz, MD       Current Outpatient Medications  Medication Sig Dispense Refill Last Dose  . docusate sodium (COLACE) 100 MG capsule Take 100 mg by mouth daily.   Taking  .  ferrous sulfate 325 (65 FE) MG tablet TAKE 1 TABLET BY MOUTH 3 TIMES A DAY WITH MEALS (Patient taking differently: Take 325 mg by mouth once daily) 30 tablet 5   . Multiple Vitamins-Minerals (MULTIVITAMIN WITH MINERALS) tablet Take 1 tablet by mouth daily.   Taking   No Known Allergies  Social History    Tobacco Use  . Smoking status: Light Tobacco Smoker    Years: 26.00    Types: Cigars  . Smokeless tobacco: Former Network engineer Use Topics  . Alcohol use: No    Alcohol/week: 0.0 oz    Frequency: Never    Family History  Problem Relation Age of Onset  . Diabetes Mother      ROS ROS: I have reviewed the patient's review of systems thoroughly and there are no positive responses as relates to the HPI. Objective:  Physical Exam  Vital signs in last 24 hours:   Well-developed well-nourished patient in no acute distress. Alert and oriented x3 HEENT:within normal limits Cardiac: Regular rate and rhythm Pulmonary: Lungs clear to auscultation Abdomen: Soft and nontender.  Normal active bowel sounds  Musculoskeletal: (Left hip: Limited range of motion.  Painful range of motion.  Neurovascular intact distally. Labs: Recent Results (from the past 2160 hour(s))  Urinalysis, Routine w reflex microscopic     Status: None   Collection Time: 08/22/17  8:57 AM  Result Value Ref Range   Color, Urine YELLOW YELLOW   APPearance CLEAR CLEAR   Specific Gravity, Urine 1.017 1.005 - 1.030   pH 7.0 5.0 - 8.0   Glucose, UA NEGATIVE NEGATIVE mg/dL   Hgb urine dipstick NEGATIVE NEGATIVE   Bilirubin Urine NEGATIVE NEGATIVE   Ketones, ur NEGATIVE NEGATIVE mg/dL   Protein, ur NEGATIVE NEGATIVE mg/dL   Nitrite NEGATIVE NEGATIVE   Leukocytes, UA NEGATIVE NEGATIVE  APTT     Status: None   Collection Time: 08/22/17  8:58 AM  Result Value Ref Range   aPTT 33 24 - 36 seconds  CBC WITH DIFFERENTIAL     Status: Abnormal   Collection Time: 08/22/17  8:58 AM  Result Value Ref Range   WBC 3.3 (L) 4.0 - 10.5 K/uL   RBC 5.18 4.22 - 5.81 MIL/uL   Hemoglobin 13.3 13.0 - 17.0 g/dL   HCT 40.5 39.0 - 52.0 %   MCV 78.2 78.0 - 100.0 fL   MCH 25.7 (L) 26.0 - 34.0 pg   MCHC 32.8 30.0 - 36.0 g/dL   RDW 14.9 11.5 - 15.5 %   Platelets 171 150 - 400 K/uL   Neutrophils Relative % 33 %   Neutro Abs 1.1 (L)  1.7 - 7.7 K/uL   Lymphocytes Relative 55 %   Lymphs Abs 1.8 0.7 - 4.0 K/uL   Monocytes Relative 11 %   Monocytes Absolute 0.4 0.1 - 1.0 K/uL   Eosinophils Relative 1 %   Eosinophils Absolute 0.0 0.0 - 0.7 K/uL   Basophils Relative 0 %   Basophils Absolute 0.0 0.0 - 0.1 K/uL  Comprehensive metabolic panel     Status: Abnormal   Collection Time: 08/22/17  8:58 AM  Result Value Ref Range   Sodium 137 135 - 145 mmol/L   Potassium 4.2 3.5 - 5.1 mmol/L   Chloride 104 101 - 111 mmol/L   CO2 28 22 - 32 mmol/L   Glucose, Bld 90 65 - 99 mg/dL   BUN 10 6 - 20 mg/dL   Creatinine, Ser 0.86 0.61 - 1.24 mg/dL  Calcium 9.4 8.9 - 10.3 mg/dL   Total Protein 7.3 6.5 - 8.1 g/dL   Albumin 3.9 3.5 - 5.0 g/dL   AST 22 15 - 41 U/L   ALT 11 (L) 17 - 63 U/L   Alkaline Phosphatase 73 38 - 126 U/L   Total Bilirubin 0.5 0.3 - 1.2 mg/dL   GFR calc non Af Amer >60 >60 mL/min   GFR calc Af Amer >60 >60 mL/min    Comment: (NOTE) The eGFR has been calculated using the CKD EPI equation. This calculation has not been validated in all clinical situations. eGFR's persistently <60 mL/min signify possible Chronic Kidney Disease.    Anion gap 5 5 - 15  Protime-INR     Status: None   Collection Time: 08/22/17  8:58 AM  Result Value Ref Range   Prothrombin Time 13.3 11.4 - 15.2 seconds   INR 1.02   Surgical pcr screen     Status: None   Collection Time: 08/22/17  8:58 AM  Result Value Ref Range   MRSA, PCR NEGATIVE NEGATIVE   Staphylococcus aureus NEGATIVE NEGATIVE    Comment: (NOTE) The Xpert SA Assay (FDA approved for NASAL specimens in patients 53 years of age and older), is one component of a comprehensive surveillance program. It is not intended to diagnose infection nor to guide or monitor treatment.   Type and screen Order type and screen if day of surgery is less than 15 days from draw of preadmission visit or order morning of surgery if day of surgery is greater than 6 days from preadmission visit.      Status: None   Collection Time: 08/22/17  9:10 AM  Result Value Ref Range   ABO/RH(D) O POS    Antibody Screen NEG    Sample Expiration 09/05/2017    Extend sample reason NO TRANSFUSIONS OR PREGNANCY IN THE PAST 3 MONTHS     Estimated body mass index is 29.45 kg/m as calculated from the following:   Height as of 08/23/17: 5' 11"  (1.803 m).   Weight as of 08/23/17: 95.8 kg (211 lb 1.9 oz).   Imaging Review Plain radiographs demonstrate severe degenerative joint disease of the left hip(s). The bone quality appears to be fair for age and reported activity level.  Assessment/Plan:  End stage arthritis, left hip(s)  The patient history, physical examination, clinical judgement of the provider and imaging studies are consistent with end stage degenerative joint disease of the left hip(s) and total hip arthroplasty is deemed medically necessary. The treatment options including medical management, injection therapy, arthroscopy and arthroplasty were discussed at length. The risks and benefits of total hip arthroplasty were presented and reviewed. The risks due to aseptic loosening, infection, stiffness, dislocation/subluxation,  thromboembolic complications and other imponderables were discussed.  The patient acknowledged the explanation, agreed to proceed with the plan and consent was signed. Patient is being admitted for inpatient treatment for surgery, pain control, PT, OT, prophylactic antibiotics, VTE prophylaxis, progressive ambulation and ADL's and discharge planning.The patient is planning to be discharged home with home health services

## 2017-08-29 ENCOUNTER — Other Ambulatory Visit: Payer: Self-pay

## 2017-08-29 ENCOUNTER — Encounter (HOSPITAL_COMMUNITY): Payer: Self-pay | Admitting: General Practice

## 2017-08-29 DIAGNOSIS — D62 Acute posthemorrhagic anemia: Secondary | ICD-10-CM

## 2017-08-29 LAB — BASIC METABOLIC PANEL
Anion gap: 6 (ref 5–15)
BUN: 10 mg/dL (ref 6–20)
CHLORIDE: 109 mmol/L (ref 101–111)
CO2: 21 mmol/L — ABNORMAL LOW (ref 22–32)
Calcium: 8.5 mg/dL — ABNORMAL LOW (ref 8.9–10.3)
Creatinine, Ser: 0.72 mg/dL (ref 0.61–1.24)
GFR calc Af Amer: >60 mL/min (ref >60)
GFR calc non Af Amer: >60 mL/min (ref >60)
GLUCOSE: 135 mg/dL — AB (ref 65–99)
POTASSIUM: 4 mmol/L (ref 3.5–5.1)
Sodium: 136 mmol/L (ref 135–145)

## 2017-08-29 LAB — CBC
HEMATOCRIT: 30.5 % — AB (ref 39.0–52.0)
HEMOGLOBIN: 9.9 g/dL — AB (ref 13.0–17.0)
MCH: 25.1 pg — AB (ref 26.0–34.0)
MCHC: 32.5 g/dL (ref 30.0–36.0)
MCV: 77.2 fL — AB (ref 78.0–100.0)
Platelets: 130 10*3/uL — ABNORMAL LOW (ref 150–400)
RBC: 3.95 MIL/uL — AB (ref 4.22–5.81)
RDW: 14.6 % (ref 11.5–15.5)
WBC: 6.4 10*3/uL (ref 4.0–10.5)

## 2017-08-29 NOTE — Progress Notes (Signed)
Pt given prescriptions and discharge instructions. RN gone over instructions with him and answered any questions he had to satisfaction. He is awaiting his ride to pick him up and take him home. Had 3N1 and walker.

## 2017-08-29 NOTE — Progress Notes (Signed)
Subjective: 1 Day Post-Op Procedure(s) (LRB): TOTAL HIP ARTHROPLASTY ANTERIOR APPROACH (Left) Patient reports pain as mild. Foley out this morning.  He has not voided yet.  He reports no pain in his left hip.  He has not taken any pain medicine.  He is taking by mouth without difficulty. Denies dizziness, chest pain, or shortness of breath.  He wants to go home later today.   Objective: Vital signs in last 24 hours: Temp:  [97.7 F (36.5 C)-98 F (36.7 C)] 97.7 F (36.5 C) (11/27 0500) Pulse Rate:  [54-78] 55 (11/27 0500) Resp:  [12-20] 20 (11/27 0500) BP: (102-131)/(55-99) 109/55 (11/27 0500) SpO2:  [100 %] 100 % (11/27 0500) Weight:  [95.7 kg (211 lb)] 95.7 kg (211 lb) (11/26 1236)  Intake/Output from previous day: 11/26 0701 - 11/27 0700 In: 2445 [P.O.:120; I.V.:2000; IV Piggyback:250] Out: 2100 [Urine:1100; Blood:1000] Intake/Output this shift: No intake/output data recorded.  Recent Labs    08/29/17 0648  HGB 9.9*   Recent Labs    08/29/17 0648  WBC 6.4  RBC 3.95*  HCT 30.5*  PLT 130*   Recent Labs    08/29/17 0648  NA 136  K 4.0  CL 109  CO2 21*  BUN 10  CREATININE 0.72  GLUCOSE 135*  CALCIUM 8.5*   No results for input(s): LABPT, INR in the last 72 hours. Left hip exam: Sensation intact distally Intact pulses distally Dorsiflexion/Plantar flexion intact Incision: dressing C/D/I Compartment soft  Assessment/Plan: 1 Day Post-Op Procedure(s) (LRB): TOTAL HIP ARTHROPLASTY ANTERIOR APPROACH (Left)Acute blood loss anemia, asymptomatic, expected. Plan: Weight-bear as tolerated on left without hip precautions. Aspirin 325 mg twice daily for DVT prophylaxis..  Takes 1 month postop. Up with therapy Discharge home with home health if passes physical therapy this morning. Follow-up with Dr. Luiz BlareGraves in 2 weeks.  Daija Routson G 08/29/2017, 8:37 AM

## 2017-08-29 NOTE — Care Management Note (Signed)
  Case Management Note  Patient Details  Name: Jordan PeacockRoger Bailey MRN: [id number] Date of Birth: 12/22/1965  Subjective/Objective:  51 yr old male s/p left total hip anterior approach.                    Action/Plan: Case manager spoke with patient concerning discharge plan and DME needs. Patient was preoperatively setup with [facility], no changes. DME has been delivered, he will have family support at discharge.    Expected Discharge Date:  08/29/17               Expected Discharge Plan:  Home w Home Health Services  In-House Referral:     Discharge planning Services     Post Acute Care Choice:  Durable Medical Equipment, Home Health Choice offered to:  Patient  DME Arranged:  3-N-1, Walker rolling DME Agency:  [employer]  HH Arranged:  PT HH Agency:  [facility]  Status of Service:  Completed, signed off  If discussed at Long Length of Stay Meetings, dates discussed:    Additional Comments:  Durenda GuthrieBrady, Kippy Gohman Naomi, RN 08/29/2017, 12:35 PM

## 2017-08-29 NOTE — Plan of Care (Signed)
  Adequate for Discharge Health Behavior/Discharge Planning: Ability to manage health-related needs will improve 08/29/2017 1508 - Adequate for Discharge by Ephraim HamburgerAdams, Amillya Chavira M, RN Activity: Risk for activity intolerance will decrease 08/29/2017 1508 - Adequate for Discharge by Ephraim HamburgerAdams, Irish Breisch M, RN Safety: Ability to remain free from injury will improve 08/29/2017 1508 - Adequate for Discharge by Ephraim HamburgerAdams, Rivkah Wolz M, RN Skin Integrity: Risk for impaired skin integrity will decrease 08/29/2017 1508 - Adequate for Discharge by Ephraim HamburgerAdams, Shavontae Gibeault M, RN

## 2017-08-29 NOTE — Brief Op Note (Signed)
08/28/2017  7:34 AM  PATIENT:  Jordan Bailey  51 y.o. male  PRE-OPERATIVE DIAGNOSIS:  OSTEOARTHRITIS LEFT HIP  POST-OPERATIVE DIAGNOSIS:  OSTEOARTHRITIS LEFT HIP  PROCEDURE:  Procedure(s): TOTAL HIP ARTHROPLASTY ANTERIOR APPROACH (Left)  SURGEON:  Surgeon(s) and Role:    Jodi Geralds* Saathvik Every, MD - Primary  PHYSICIAN ASSISTANT:   ASSISTANTS: jim bethune   ANESTHESIA:   spinal  EBL:  1000 mL   BLOOD ADMINISTERED:none  DRAINS: none   LOCAL MEDICATIONS USED:  MARCAINE    and OTHER experel  SPECIMEN:  No Specimen  DISPOSITION OF SPECIMEN:  N/A  COUNTS:  YES  TOURNIQUET:  * No tourniquets in log *  DICTATION: .Other Dictation: Dictation Number [id number]  PLAN OF CARE: Admit to inpatient   PATIENT DISPOSITION:  PACU - hemodynamically stable.   Delay start of Pharmacological VTE agent (>24hrs) due to surgical blood loss or risk of bleeding: no

## 2017-08-29 NOTE — Progress Notes (Signed)
Physical Therapy Treatment Patient Details Name: Jordan PeacockRoger Strite MRN: [id number] DOB: 08/19/1966 Today's Date: 08/29/2017    History of Present Illness Pt is a 51 y/o male who presents s/p L THA direct anterior approach on 08/28/17. PMH significant for bilateral knee replacements, and L tendon repair (stretched - ??quad??) with residual decreased knee extension.     PT Comments    Pt progressing towards physical therapy goals. Was able to perform transfers and ambulation with gross supervision for safety. Pt was educated on car transfer, HEP, and optimal posture. Recommended waiting until MD clears pt for any quad/hip flexor stretches, however feel pt's long history of muscle imbalance is limiting pt's ability to achieve upright posture. Will continue to follow.   Follow Up Recommendations  DC plan and follow up therapy as arranged by surgeon;Home health PT     Equipment Recommendations  Rolling walker with 5" wheels;3in1 (PT)    Recommendations for Other Services       Precautions / Restrictions Precautions Precautions: Fall Precaution Comments: No precautions, however pt was educated on positioning and general safety. Restrictions Weight Bearing Restrictions: Yes LLE Weight Bearing: Weight bearing as tolerated    Mobility  Bed Mobility               General bed mobility comments: Pt up in chair upon PT arrival  Transfers Overall transfer level: Needs assistance Equipment used: Rolling walker (2 wheeled) Transfers: Sit to/from Stand Sit to Stand: Supervision         General transfer comment: Light supervision for safety as pt powered-up to full standing position. Pt was able to gain and maintain standing balance without assist, however increased time required to improve flexed posture.   Ambulation/Gait Ambulation/Gait assistance: Supervision Ambulation Distance (Feet): 250 Feet Assistive device: Rolling walker (2 wheeled) Gait Pattern/deviations: Step-through  pattern;Decreased stride length;Trunk flexed Gait velocity: Decreased Gait velocity interpretation: Below normal speed for age/gender General Gait Details: Continued to focus on improved posture, increased heel strike, and general safety with RW. Pt motivated to make corrective changes with posture.    Stairs            Wheelchair Mobility    Modified Rankin (Stroke Patients Only)       Balance Overall balance assessment: Needs assistance Sitting-balance support: Feet supported;No upper extremity supported Sitting balance-Leahy Scale: Good     Standing balance support: Bilateral upper extremity supported;During functional activity Standing balance-Leahy Scale: Poor Standing balance comment: reliant on UE support                            Cognition Arousal/Alertness: Awake/alert Behavior During Therapy: WFL for tasks assessed/performed Overall Cognitive Status: Within Functional Limits for tasks assessed                                        Exercises      General Comments        Pertinent Vitals/Pain Pain Assessment: Faces Faces Pain Scale: Hurts a little bit Pain Location: L hip Pain Descriptors / Indicators: Operative site guarding;Sore Pain Intervention(s): Monitored during session    Home Living                      Prior Function            PT Goals (current goals can now be  found in the care plan section) Acute Rehab PT Goals Patient Stated Goal: Return home at d/c (today) PT Goal Formulation: With patient Time For Goal Achievement: 09/12/17 Potential to Achieve Goals: Good Progress towards PT goals: Progressing toward goals    Frequency    7X/week      PT Plan Current plan remains appropriate    Co-evaluation              AM-PAC PT "6 Clicks" Daily Activity  Outcome Measure  Difficulty turning over in bed (including adjusting bedclothes, sheets and blankets)?: None Difficulty moving  from lying on back to sitting on the side of the bed? : A Little Difficulty sitting down on and standing up from a chair with arms (e.g., wheelchair, bedside commode, etc,.)?: A Little Help needed moving to and from a bed to chair (including a wheelchair)?: A Little Help needed walking in hospital room?: A Little Help needed climbing 3-5 steps with a railing? : A Little 6 Click Score: 19    End of Session Equipment Utilized During Treatment: Gait belt Activity Tolerance: Patient tolerated treatment well Patient left: in chair;with call bell/phone within reach;with nursing/sitter in room Nurse Communication: Mobility status PT Visit Diagnosis: Unsteadiness on feet (R26.81);Pain;Difficulty in walking, not elsewhere classified (R26.2) Pain - Right/Left: Left Pain - part of body: Hip     Time: 1355-1417 PT Time Calculation (min) (ACUTE ONLY): 22 min  Charges:  $Gait Training: 8-22 mins                    G Codes:       Conni SlipperLaura Eleaner Dibartolo, PT, DPT Acute Rehabilitation Services Pager: [phone]    Marylynn PearsonLaura D Devanny Palecek 08/29/2017, 2:53 PM

## 2017-08-29 NOTE — Anesthesia Postprocedure Evaluation (Signed)
Anesthesia Post Note  Patient: Jordan PeacockRoger Bailey  Procedure(s) Performed: TOTAL HIP ARTHROPLASTY ANTERIOR APPROACH (Left Hip)     Patient location during evaluation: PACU Anesthesia Type: Spinal Level of consciousness: oriented and awake and alert Pain management: pain level controlled Vital Signs Assessment: post-procedure vital signs reviewed and stable Respiratory status: spontaneous breathing and respiratory function stable Cardiovascular status: blood pressure returned to baseline and stable Postop Assessment: no headache, no backache and no apparent nausea or vomiting Anesthetic complications: no    Last Vitals:  Vitals:   08/28/17 2045 08/29/17 0500  BP: 116/68 (!) 109/55  Pulse: 63 (!) 55  Resp: 20 20  Temp: 36.5 C 36.5 C  SpO2: 100% 100%    Last Pain:  Vitals:   08/29/17 0500  TempSrc: Oral  PainSc:                  Lowella CurbWarren Ray Miller

## 2017-08-29 NOTE — Evaluation (Signed)
Physical Therapy Evaluation Patient Details Name: Jordan PeacockRoger Adames MRN: [id number] DOB: 09/03/1966 Today's Date: 08/29/2017   History of Present Illness  Pt is a 51 y/o male who presents s/p L THA direct anterior approach on 08/28/17. PMH significant for bilateral knee replacements, and L tendon repair (stretched - ??quad??) with residual decreased knee extension.   Clinical Impression  Pt admitted with above diagnosis. Pt currently with functional limitations due to the deficits listed below (see PT Problem List). At the time of PT eval pt was able to perform transfers and ambulation with gross min guard assist to supervision for safety. Pt mobilizing well overall however demonstrates a very flexed trunk posture which pt states is normal for him. Pt with many questions regarding return to exercise - particularly running - and pt was educated on appropriate HEP post-op and safe progression of activity upon return home. Pt will benefit from skilled PT to increase their independence and safety with mobility to allow discharge to the venue listed below.       Follow Up Recommendations DC plan and follow up therapy as arranged by surgeon;Home health PT    Equipment Recommendations  Rolling walker with 5" wheels;3in1 (PT)    Recommendations for Other Services       Precautions / Restrictions Precautions Precautions: Fall Precaution Comments: No precautions, however pt was educated on positioning and general safety. Restrictions Weight Bearing Restrictions: Yes LLE Weight Bearing: Weight bearing as tolerated      Mobility  Bed Mobility               General bed mobility comments: pt sitting EOB when PT arrived.   Transfers Overall transfer level: Needs assistance Equipment used: Rolling walker (2 wheeled) Transfers: Sit to/from Stand Sit to Stand: Supervision         General transfer comment: Close supervision for safety as pt powered-up to full standing position. Pt was able to  gain and maintain standing balance without assist, however increased time required to improve flexed posture.   Ambulation/Gait Ambulation/Gait assistance: Min guard;Supervision Ambulation Distance (Feet): 200 Feet Assistive device: Rolling walker (2 wheeled) Gait Pattern/deviations: Step-through pattern;Decreased stride length;Trunk flexed Gait velocity: Decreased Gait velocity interpretation: Below normal speed for age/gender General Gait Details: VC's for sequencing and general safety with the RW. Pt initially with step-to pattern progressing to step-through. Grossly flexed posture throughout with minimal ability to make corrective changes with cues. Pt reports he has been flexed for a very long time.  Stairs            Wheelchair Mobility    Modified Rankin (Stroke Patients Only)       Balance Overall balance assessment: Needs assistance Sitting-balance support: Feet supported;No upper extremity supported Sitting balance-Leahy Scale: Good     Standing balance support: Bilateral upper extremity supported;During functional activity Standing balance-Leahy Scale: Poor Standing balance comment: reliant on UE support                             Pertinent Vitals/Pain Pain Assessment: 0-10 Pain Score: 2  Pain Location: L hip Pain Descriptors / Indicators: Operative site guarding;Sore Pain Intervention(s): Limited activity within patient's tolerance;Monitored during session;Repositioned    Home Living Family/patient expects to be discharged to:: Private residence Living Arrangements: Children Available Help at Discharge: Family;Available PRN/intermittently Type of Home: Apartment Home Access: Level entry     Home Layout: One level Home Equipment: Emergency planning/management officerhower seat;Walker - 4 wheels  Prior Function Level of Independence: Independent with assistive device(s)               Hand Dominance        Extremity/Trunk Assessment   Upper Extremity  Assessment Upper Extremity Assessment: Defer to OT evaluation    Lower Extremity Assessment Lower Extremity Assessment: LLE deficits/detail LLE Deficits / Details: Decreased strength and AROM consistent with above mentioned procedure, and residual decreased ROM from tendon repair    Cervical / Trunk Assessment Cervical / Trunk Assessment: Other exceptions Cervical / Trunk Exceptions: grossly flexed trunk with forward head and rounded shoulder posture  Communication   Communication: No difficulties  Cognition Arousal/Alertness: Awake/alert Behavior During Therapy: WFL for tasks assessed/performed Overall Cognitive Status: Within Functional Limits for tasks assessed                                        General Comments      Exercises General Exercises - Lower Extremity Ankle Circles/Pumps: 15 reps Quad Sets: 15 reps Gluteal Sets: 15 reps Long Arc Quad: 15 reps;Limitations Long Arc Quad Limitations: not able to get full extension Hip ABduction/ADduction: 10 reps   Assessment/Plan    PT Assessment Patient needs continued PT services  PT Problem List Decreased strength;Decreased range of motion;Decreased activity tolerance;Decreased balance;Decreased mobility;Decreased knowledge of use of DME;Decreased safety awareness;Decreased knowledge of precautions;Pain       PT Treatment Interventions DME instruction;Gait training;Functional mobility training;Therapeutic activities;Therapeutic exercise;Neuromuscular re-education;Patient/family education    PT Goals (Current goals can be found in the Care Plan section)  Acute Rehab PT Goals Patient Stated Goal: Return home at d/c (today) PT Goal Formulation: With patient Time For Goal Achievement: 09/12/17 Potential to Achieve Goals: Good    Frequency 7X/week   Barriers to discharge        Co-evaluation               AM-PAC PT "6 Clicks" Daily Activity  Outcome Measure Difficulty turning over in bed  (including adjusting bedclothes, sheets and blankets)?: None Difficulty moving from lying on back to sitting on the side of the bed? : A Little Difficulty sitting down on and standing up from a chair with arms (e.g., wheelchair, bedside commode, etc,.)?: A Little Help needed moving to and from a bed to chair (including a wheelchair)?: A Little Help needed walking in hospital room?: A Little Help needed climbing 3-5 steps with a railing? : A Little 6 Click Score: 19    End of Session Equipment Utilized During Treatment: Gait belt Activity Tolerance: Patient tolerated treatment well Patient left: in chair;with call bell/phone within reach;with nursing/sitter in room Nurse Communication: Mobility status PT Visit Diagnosis: Unsteadiness on feet (R26.81);Pain;Difficulty in walking, not elsewhere classified (R26.2) Pain - Right/Left: Left Pain - part of body: Hip    Time: 0810-0858 PT Time Calculation (min) (ACUTE ONLY): 48 min   Charges:   PT Evaluation $PT Eval Moderate Complexity: 1 Mod PT Treatments $Gait Training: 8-22 mins $Therapeutic Exercise: 8-22 mins   PT G Codes:        Conni SlipperLaura Leveda Kendrix, PT, DPT Acute Rehabilitation Services Pager: [phone]   Marylynn PearsonLaura D Maryuri Warnke 08/29/2017, 9:50 AM

## 2017-08-29 NOTE — Discharge Summary (Signed)
Patient ID: Daun PeacockRoger Durnell MRN: [id number] DOB/AGE: 50/10/1965 50 y.o.  Admit date: 08/28/2017 Discharge date: 08/29/2017  Admission Diagnoses:  Principal Problem:   Primary osteoarthritis of left hip Active Problems:   Intrapelvic protrusion of left acetabulum   Acute blood loss anemia   Discharge Diagnoses:  Same  Past Medical History:  Diagnosis Date  . Arthritis   . History of kidney stones   . Iron deficiency anemia   . Kidney stones     Surgeries: Procedure(s):left TOTAL HIP ARTHROPLASTY ANTERIOR APPROACH on 08/28/2017   Consultants:   Discharged Condition: Improved  Hospital Course: Daun PeacockRoger Cleavenger is an 51 y.o. male who was admitted 08/28/2017 for operative treatment ofPrimary osteoarthritis of left hip. Patient has severe unremitting pain that affects sleep, daily activities, and work/hobbies. After pre-op clearance the patient was taken to the operating room on 08/28/2017 and underwent  Procedure(s):Left TOTAL HIP ARTHROPLASTY ANTERIOR APPROACH.    Patient was given perioperative antibiotics:  Anti-infectives (From admission, onward)   Start     Dose/Rate Route Frequency Ordered Stop   08/28/17 2300  ceFAZolin (ANCEF) IVPB 2g/100 mL premix     2 g 200 mL/hr over 30 Minutes Intravenous Every 6 hours 08/28/17 2035 08/29/17 0438   08/28/17 1400  ceFAZolin (ANCEF) IVPB 2g/100 mL premix     2 g 200 mL/hr over 30 Minutes Intravenous To ShortStay Surgical 08/28/17 1228 08/28/17 1510   08/28/17 1230  ceFAZolin (ANCEF) 2-4 GM/100ML-% IVPB    Comments:  Kathrene BongoSmith, Catherine   : cabinet override      08/28/17 1230 08/28/17 1510       Patient was given sequential compression devices, early ambulation, and chemoprophylaxis to prevent DVT.The patient had no dizziness, shortness of breath, or chest pain postoperatively. He did not require any pain medication.  Patient benefited maximally from hospital stay and there were no complications.    Recent vital signs:  Patient Vitals  for the past 24 hrs:  BP Temp Temp src Pulse Resp SpO2 Weight  08/29/17 0500 (!) 109/55 97.7 F (36.5 C) Oral (!) 55 20 100 % -  08/28/17 2045 116/68 97.7 F (36.5 C) Oral 63 20 100 % -  08/28/17 1940 112/71 97.8 F (36.6 C) - (!) 59 20 100 % -  08/28/17 1925 126/75 - - 60 15 100 % -  08/28/17 1910 (!) 131/99 - - 64 18 100 % -  08/28/17 1855 125/84 - - (!) 59 14 100 % -  08/28/17 1833 (!) 118/92 - - (!) 58 12 100 % -  08/28/17 1818 122/83 - - 67 16 100 % -  08/28/17 1803 115/70 - - 65 12 100 % -  08/28/17 1749 (!) 102/92 97.7 F (36.5 C) - 78 12 100 % -  08/28/17 1236 126/86 98 F (36.7 C) Oral (!) 54 16 100 % 95.7 kg (211 lb)     Recent laboratory studies:  Recent Labs    08/29/17 0648  WBC 6.4  HGB 9.9*  HCT 30.5*  PLT 130*  NA 136  K 4.0  CL 109  CO2 21*  BUN 10  CREATININE 0.72  GLUCOSE 135*  CALCIUM 8.5*     Discharge Medications:   Allergies as of 08/29/2017   No Known Allergies     Medication List    TAKE these medications   aspirin EC 325 MG tablet Take 1 tablet (325 mg total) by mouth 2 (two) times daily after a meal. Take x 1 month post  op to decrease risk of blood clots.   docusate sodium 100 MG capsule Commonly known as:  COLACE Take 100 mg by mouth daily.   ferrous sulfate 325 (65 FE) MG tablet TAKE 1 TABLET BY MOUTH 3 TIMES A DAY WITH MEALS What changed:  See the new instructions.   multivitamin with minerals tablet Take 1 tablet by mouth daily.   oxyCODONE-acetaminophen 5-325 MG tablet Commonly known as:  PERCOCET/ROXICET Take 1-2 tablets by mouth every 6 (six) hours as needed for severe pain.   tiZANidine 2 MG tablet Commonly known as:  ZANAFLEX Take 1 tablet (2 mg total) by mouth every 8 (eight) hours as needed for muscle spasms.            Discharge Care Instructions  (From admission, onward)        Start     Ordered   08/29/17 0000  Weight bearing as tolerated    Question:  Laterality  Answer:  left   08/29/17 0840       Diagnostic Studies: Dg Chest 2 View  Result Date: 08/22/2017 CLINICAL DATA:  Preop for left hip arthroplasty EXAM: CHEST  2 VIEW COMPARISON:  Chest x-ray of 02/23/2015 FINDINGS: The lungs remain clear and somewhat hyperaerated. Mediastinal and hilar contours are unremarkable. The heart is within normal limits in size. No acute bony abnormality is seen. Considerable degenerative change involves both shoulders. IMPRESSION: 1. No active cardiopulmonary disease.  Slight hyper aeration. 2. Considerable degenerative change of both shoulders. Electronically Signed   By: Dwyane DeePaul  Barry M.D.   On: 08/22/2017 11:32   Dg C-arm 61-120 Min  Result Date: 08/28/2017 CLINICAL DATA:  Total left hip arthroplasty. EXAM: OPERATIVE LEFT HIP (WITH PELVIS IF PERFORMED) 1 VIEWS TECHNIQUE: Fluoroscopic spot image(s) were submitted for interpretation post-operatively. COMPARISON:  None. FINDINGS: Procedural fluoroscopy of the left hip shows total arthroplasty that is well seated. No evidence of periprosthetic fracture or dislocation. IMPRESSION: Fluoroscopy for left total hip arthroplasty.  No unexpected finding. Electronically Signed   By: Marnee SpringJonathon  Watts M.D.   On: 08/28/2017 18:47   Dg Hip Port Unilat With Pelvis 1v Left  Result Date: 08/28/2017 CLINICAL DATA:  51 y/o  M; status post total left hip replacement. EXAM: DG HIP (WITH OR WITHOUT PELVIS) 1V PORT LEFT COMPARISON:  None. FINDINGS: Left total hip prosthesis. No periprosthetic lucency or fracture identified. Severe osteoarthrosis of the right hip joint with loss of superior joint space and sclerotic/ fibrocystic degeneration of articular surfaces. Postsurgical changes of left hip soft tissues with edema and air throughout the flank and skin staples. IMPRESSION: Total left hip prosthesis with soft tissue postsurgical changes. Severe right hip osteoarthrosis. Electronically Signed   By: Mitzi HansenLance  Furusawa-Stratton M.D.   On: 08/28/2017 20:48   Dg Hip Operative  Unilat W Or W/o Pelvis Left  Result Date: 08/28/2017 CLINICAL DATA:  Total left hip arthroplasty. EXAM: OPERATIVE LEFT HIP (WITH PELVIS IF PERFORMED) 1 VIEWS TECHNIQUE: Fluoroscopic spot image(s) were submitted for interpretation post-operatively. COMPARISON:  None. FINDINGS: Procedural fluoroscopy of the left hip shows total arthroplasty that is well seated. No evidence of periprosthetic fracture or dislocation. IMPRESSION: Fluoroscopy for left total hip arthroplasty.  No unexpected finding. Electronically Signed   By: Marnee SpringJonathon  Watts M.D.   On: 08/28/2017 18:47    Disposition: 01-Home or Self Care  Discharge Instructions    Call MD / Call 911   Complete by:  As directed    If you experience chest pain or shortness of  breath, CALL 911 and be transported to the hospital emergency room.  If you develope a fever above 101 F, pus (white drainage) or increased drainage or redness at the wound, or calf pain, call your surgeon's office.   Constipation Prevention   Complete by:  As directed    Drink plenty of fluids.  Prune juice may be helpful.  You may use a stool softener, such as Colace (over the counter) 100 mg twice a day.  Use MiraLax (over the counter) for constipation as needed.   Do not sit on low chairs, stoools or toilet seats, as it may be difficult to get up from low surfaces   Complete by:  As directed    Increase activity slowly as tolerated   Complete by:  As directed    Weight bearing as tolerated   Complete by:  As directed    Laterality:  left      Follow-up Information    Jodi Geralds, MD. Schedule an appointment as soon as possible for a visit in 2 weeks.   Specialty:  Orthopedic Surgery Contact information: [address] [place] [address] [phone]            Signed: Matthew Folks 08/29/2017, 8:41 AM

## 2017-08-29 NOTE — Op Note (Signed)
NAME:  Jordan Bailey, Jordan Bailey                     ACCOUNT NO.:  MEDICAL RECORD NO.:  [id number]  LOCATION:                                 FACILITY:  PHYSICIAN:  Jordan Bailey, M.D.        DATE OF BIRTH:  DATE OF PROCEDURE:  08/28/2017 DATE OF DISCHARGE:                              OPERATIVE REPORT   PREOPERATIVE DIAGNOSIS:  Severe end-stage degeneration degenerative joint disease, left hip, with severe protrusio acetabulum.  POSTOPERATIVE DIAGNOSIS:  Severe end-stage degeneration degenerative joint disease, left hip, with severe protrusio acetabulum.  PROCEDURES: 1. Left total hip replacement with Corail stem size11,2656mm gription cup and 2 dome screws, and 8836mm+8.5 32mm ceramic head ball,and +4 neutral liner  with treatment of extreme severe     protrusio acetabulum. 2. Interpretation of multiple intraoperative fluoroscopic images.  SURGEON:  Jordan JuniorJohn L. Jordan Foti, Jordan Bailey.  ASSISTANT:  Jordan LyJames Bethune, PA.  ANESTHESIA:  Spinal.  BRIEF HISTORY:  Jordan Bailey is a 51 year old male with a long history of having been morbidly obese through some portion of his life.  He underwent significant weight loss and unfortunately was left with severe arthritic joints including both of his hips.  Both of his hips unfortunate also had significant protrusio acetabulum.  We had discussed issues with him, but he felt that hip replacement was appropriate and he is brought to the operating room for this procedure because of severe arthritic change, nighttime pain, light activity pain, and x-rays unfortunately showing severe end-stage degenerative joint disease with severe protrusio acetabulum.  The patient was brought to the operating room for this procedure.  DESCRIPTION OF PROCEDURE:  The patient was brought to the operating room after adequate anesthesia was obtained with spinal anesthetic.  The patient was placed supine on the operating table.  He was then moved onto the [place] bed.  Attention was then turned to  the left hip, which was prepped and draped in the usual sterile fashion.  Following this, an incision was made for an anterior approach to the hip.  Subcutaneous tissue was taken down the level of the tensor fascia, divided in line with its fibers.  The muscle was then finger fractured, finger dissected and retractors were put in place above and below the femoral neck.  The capsule was opened and tagged and following this, a provisional neck cut was made.  The head was removed and sized on the back table.  At this point, retractors were put in place above and below the acetabulum and labrectomy was performed.  Osteophytes were removed to get better access to the actual acetabulum.  At this point, we started with a very large reamer, which would just contact outwardly and we gradually went up.  We used fluoro multiple times at this point to get the angle alignment of the acetabulum correct and we did not want to go deeper into the depths of this acetabulum at all.  Once we got a sizing situation, a 55 reamer was used, was getting bite in circumferential basis.  We at that point we took the femoral head to the back table and took a 42 reamer and filled it  5 times to get essentially all the bone graft that was available from the femoral head.  We took all this bone graft and packed it aggressively in the bottom of the acetabulum, which I had scraped with both the reamer and also a curette to get some foci of bleeding bone and get that exposed.  Once this bone graft had been placed, a Sector Gription cup was placed and this was done completely under fluoroscopic guidance to get to 45 degrees of lateral opening and 30 degrees of anteversion to be perfectly positioned where the cup would normally fit, but the bone graft had done a very nice job of filling in this entire defect behind the cup and we were able to actually seat the cup very aggressively because we had a new fresh layer of bone  graft essentially behind this cup.  At this point, the cup was actually very stable, but I felt that screw fixation would make sense.  I put 2 screws up to the superior quadrant, which gave very nice fixation.  We then put a +4 neutral liner and then turned to the femoral side.  I went to the femoral side and actually rotated the leg and extended the leg and abducted the leg.  At this point, we opened the hip laterally and sequentially rasped initially with a cookie cutter followed by a chili pepper followed by a size 8 rasp and we rasped subsequently to a level of 11 which got an excellent bite and fill and then placed the 11 in the stem at this point.  The 11 stem fit very nicely and we then did a trial reduction.  At that point, we were fairly symmetric with a +0 ball.  Did a leg length on the opposite side, which I felt was short because of protrusio on that side.  So, at this point, we put a trial with a +12. This appeared to be a little longish at that point with +8.5 ball.  The final ceramic ball was opened and placed and at this point, final images showed to be lengthened about 5-7 mm which was exactly what we wanted to do.  At this point, the capsule was closed with #1 Vicryl running.  The tensor fascia was closed with 0 Vicryl running, skin with 0 and 2-0 Vicryl and 3-0 Monocryl subcuticular.  Benzoin and Steri-Strips were applied.  Sterile compressive dressing was applied.  The patient was taken to the recovery room where she was noted to be in satisfactory condition.  Estimated blood loss for procedure was around 1000 mL.  A final accurate blood loss could be gotten from the anesthetic record.  The case was a very complicated case involving significant preoperative planning and intraoperative decision making.  We bone grafted the acetabulum.  We had to use fluoro to much greater extent than typical to get the alignment of the cup in position and also to use screw fixation to  make sure that we had adequate fixation.  Case took potentially twice the time of a normal total hip and happily we were very satisfied with the ultimate result.  Jordan Bailey assisted throughout the case and was critical in the performance of the surgery.     Jordan Bailey, M.D.     Ranae PlumberJLG/MEDQ  D:  08/29/2017  T:  08/29/2017  Job:  [id number]

## 2017-08-30 ENCOUNTER — Telehealth: Payer: Self-pay

## 2017-08-30 DIAGNOSIS — D649 Anemia, unspecified: Secondary | ICD-10-CM | POA: Diagnosis not present

## 2017-08-30 DIAGNOSIS — Z96642 Presence of left artificial hip joint: Secondary | ICD-10-CM | POA: Diagnosis not present

## 2017-08-30 DIAGNOSIS — Z96653 Presence of artificial knee joint, bilateral: Secondary | ICD-10-CM | POA: Diagnosis not present

## 2017-08-30 DIAGNOSIS — Z471 Aftercare following joint replacement surgery: Secondary | ICD-10-CM | POA: Diagnosis not present

## 2017-08-30 MED ORDER — FERROUS SULFATE 325 (65 FE) MG PO TABS: 325.0000 mg | ORAL_TABLET | Freq: Three times a day (TID) | ORAL | 5 refills | Status: DC

## 2017-08-30 NOTE — Telephone Encounter (Signed)
Patient is requesting an updated prescription for Ferrous Sulfate for 3 times daily. Last prescription was for only 30 tablets. He wants a 30 day supply. Never written by Dr Denyse Amassorey.   Also he just was released from the hospital for a hip replacement. A CBC was drawn and the hemoglobin was low.

## 2017-08-30 NOTE — Telephone Encounter (Signed)
Iron written

## 2017-08-31 NOTE — Telephone Encounter (Signed)
Patient aware.

## 2017-09-01 DIAGNOSIS — Z96653 Presence of artificial knee joint, bilateral: Secondary | ICD-10-CM | POA: Diagnosis not present

## 2017-09-01 DIAGNOSIS — Z96642 Presence of left artificial hip joint: Secondary | ICD-10-CM | POA: Diagnosis not present

## 2017-09-01 DIAGNOSIS — D649 Anemia, unspecified: Secondary | ICD-10-CM | POA: Diagnosis not present

## 2017-09-01 DIAGNOSIS — Z471 Aftercare following joint replacement surgery: Secondary | ICD-10-CM | POA: Diagnosis not present

## 2017-09-05 DIAGNOSIS — D649 Anemia, unspecified: Secondary | ICD-10-CM | POA: Diagnosis not present

## 2017-09-05 DIAGNOSIS — Z96642 Presence of left artificial hip joint: Secondary | ICD-10-CM | POA: Diagnosis not present

## 2017-09-05 DIAGNOSIS — Z96653 Presence of artificial knee joint, bilateral: Secondary | ICD-10-CM | POA: Diagnosis not present

## 2017-09-05 DIAGNOSIS — Z471 Aftercare following joint replacement surgery: Secondary | ICD-10-CM | POA: Diagnosis not present

## 2017-09-07 DIAGNOSIS — Z471 Aftercare following joint replacement surgery: Secondary | ICD-10-CM | POA: Diagnosis not present

## 2017-09-07 DIAGNOSIS — Z96653 Presence of artificial knee joint, bilateral: Secondary | ICD-10-CM | POA: Diagnosis not present

## 2017-09-07 DIAGNOSIS — D649 Anemia, unspecified: Secondary | ICD-10-CM | POA: Diagnosis not present

## 2017-09-07 DIAGNOSIS — Z96642 Presence of left artificial hip joint: Secondary | ICD-10-CM | POA: Diagnosis not present

## 2017-09-12 DIAGNOSIS — Z471 Aftercare following joint replacement surgery: Secondary | ICD-10-CM | POA: Diagnosis not present

## 2017-09-12 DIAGNOSIS — Z96642 Presence of left artificial hip joint: Secondary | ICD-10-CM | POA: Diagnosis not present

## 2017-09-12 DIAGNOSIS — D649 Anemia, unspecified: Secondary | ICD-10-CM | POA: Diagnosis not present

## 2017-09-12 DIAGNOSIS — Z96653 Presence of artificial knee joint, bilateral: Secondary | ICD-10-CM | POA: Diagnosis not present

## 2017-09-14 DIAGNOSIS — M1612 Unilateral primary osteoarthritis, left hip: Secondary | ICD-10-CM | POA: Diagnosis not present

## 2017-09-14 DIAGNOSIS — Z9889 Other specified postprocedural states: Secondary | ICD-10-CM | POA: Diagnosis not present

## 2017-09-15 DIAGNOSIS — Z96653 Presence of artificial knee joint, bilateral: Secondary | ICD-10-CM | POA: Diagnosis not present

## 2017-09-15 DIAGNOSIS — D649 Anemia, unspecified: Secondary | ICD-10-CM | POA: Diagnosis not present

## 2017-09-15 DIAGNOSIS — Z96642 Presence of left artificial hip joint: Secondary | ICD-10-CM | POA: Diagnosis not present

## 2017-09-15 DIAGNOSIS — Z471 Aftercare following joint replacement surgery: Secondary | ICD-10-CM | POA: Diagnosis not present

## 2017-09-19 DIAGNOSIS — Z96642 Presence of left artificial hip joint: Secondary | ICD-10-CM | POA: Diagnosis not present

## 2017-09-19 DIAGNOSIS — D649 Anemia, unspecified: Secondary | ICD-10-CM | POA: Diagnosis not present

## 2017-09-19 DIAGNOSIS — Z471 Aftercare following joint replacement surgery: Secondary | ICD-10-CM | POA: Diagnosis not present

## 2017-09-19 DIAGNOSIS — Z96653 Presence of artificial knee joint, bilateral: Secondary | ICD-10-CM | POA: Diagnosis not present

## 2017-09-19 DIAGNOSIS — M1612 Unilateral primary osteoarthritis, left hip: Secondary | ICD-10-CM | POA: Diagnosis not present

## 2017-09-25 DIAGNOSIS — Z471 Aftercare following joint replacement surgery: Secondary | ICD-10-CM | POA: Diagnosis not present

## 2017-09-25 DIAGNOSIS — D649 Anemia, unspecified: Secondary | ICD-10-CM | POA: Diagnosis not present

## 2017-09-25 DIAGNOSIS — Z96642 Presence of left artificial hip joint: Secondary | ICD-10-CM | POA: Diagnosis not present

## 2017-09-25 DIAGNOSIS — Z96653 Presence of artificial knee joint, bilateral: Secondary | ICD-10-CM | POA: Diagnosis not present

## 2017-09-28 DIAGNOSIS — Z96642 Presence of left artificial hip joint: Secondary | ICD-10-CM | POA: Diagnosis not present

## 2017-09-28 DIAGNOSIS — D649 Anemia, unspecified: Secondary | ICD-10-CM | POA: Diagnosis not present

## 2017-09-28 DIAGNOSIS — Z471 Aftercare following joint replacement surgery: Secondary | ICD-10-CM | POA: Diagnosis not present

## 2017-09-28 DIAGNOSIS — Z96653 Presence of artificial knee joint, bilateral: Secondary | ICD-10-CM | POA: Diagnosis not present

## 2017-10-03 DIAGNOSIS — D649 Anemia, unspecified: Secondary | ICD-10-CM | POA: Diagnosis not present

## 2017-10-03 DIAGNOSIS — Z96653 Presence of artificial knee joint, bilateral: Secondary | ICD-10-CM | POA: Diagnosis not present

## 2017-10-03 DIAGNOSIS — Z96642 Presence of left artificial hip joint: Secondary | ICD-10-CM | POA: Diagnosis not present

## 2017-10-03 DIAGNOSIS — Z471 Aftercare following joint replacement surgery: Secondary | ICD-10-CM | POA: Diagnosis not present

## 2017-10-05 DIAGNOSIS — M1612 Unilateral primary osteoarthritis, left hip: Secondary | ICD-10-CM | POA: Diagnosis not present

## 2017-10-06 ENCOUNTER — Telehealth: Payer: Self-pay

## 2017-10-06 DIAGNOSIS — Z96653 Presence of artificial knee joint, bilateral: Secondary | ICD-10-CM | POA: Diagnosis not present

## 2017-10-06 DIAGNOSIS — Z96642 Presence of left artificial hip joint: Secondary | ICD-10-CM | POA: Diagnosis not present

## 2017-10-06 DIAGNOSIS — Z471 Aftercare following joint replacement surgery: Secondary | ICD-10-CM | POA: Diagnosis not present

## 2017-10-06 DIAGNOSIS — D649 Anemia, unspecified: Secondary | ICD-10-CM | POA: Diagnosis not present

## 2017-10-06 NOTE — Telephone Encounter (Signed)
Patient called requested a pain medication sent to his pharmacy, he is due to have surgery at some point on his hip.he stated that he forgot to ask his surgeon when he went to visit him for a Rx and he did not want to drive [place] the way to [place]. Please advise. Rhonda Cunningham,CMA

## 2017-10-09 NOTE — Telephone Encounter (Signed)
I think pain medicine should be handled by the surgeon. If pain is intolerable that really requires an office visit

## 2017-10-09 NOTE — Telephone Encounter (Signed)
SPOKE TO PATIENT GAVE HIM ADVISE AS NOTED BELOW. Kahil Agner,CMA  

## 2017-10-11 DIAGNOSIS — Z471 Aftercare following joint replacement surgery: Secondary | ICD-10-CM | POA: Diagnosis not present

## 2017-10-11 DIAGNOSIS — Z96642 Presence of left artificial hip joint: Secondary | ICD-10-CM | POA: Diagnosis not present

## 2017-10-11 DIAGNOSIS — Z96653 Presence of artificial knee joint, bilateral: Secondary | ICD-10-CM | POA: Diagnosis not present

## 2017-10-11 DIAGNOSIS — D649 Anemia, unspecified: Secondary | ICD-10-CM | POA: Diagnosis not present

## 2017-10-13 DIAGNOSIS — Z96653 Presence of artificial knee joint, bilateral: Secondary | ICD-10-CM | POA: Diagnosis not present

## 2017-10-13 DIAGNOSIS — Z471 Aftercare following joint replacement surgery: Secondary | ICD-10-CM | POA: Diagnosis not present

## 2017-10-13 DIAGNOSIS — Z96642 Presence of left artificial hip joint: Secondary | ICD-10-CM | POA: Diagnosis not present

## 2017-10-13 DIAGNOSIS — D649 Anemia, unspecified: Secondary | ICD-10-CM | POA: Diagnosis not present

## 2017-11-14 DIAGNOSIS — M1612 Unilateral primary osteoarthritis, left hip: Secondary | ICD-10-CM | POA: Diagnosis not present

## 2017-11-14 DIAGNOSIS — Z96642 Presence of left artificial hip joint: Secondary | ICD-10-CM | POA: Diagnosis not present

## 2017-11-14 DIAGNOSIS — Z471 Aftercare following joint replacement surgery: Secondary | ICD-10-CM | POA: Diagnosis not present

## 2017-11-14 DIAGNOSIS — M1611 Unilateral primary osteoarthritis, right hip: Secondary | ICD-10-CM | POA: Diagnosis not present

## 2017-11-21 ENCOUNTER — Other Ambulatory Visit: Payer: Self-pay | Admitting: Orthopedic Surgery

## 2017-11-24 ENCOUNTER — Encounter (HOSPITAL_COMMUNITY): Payer: Self-pay

## 2017-11-24 NOTE — Patient Instructions (Signed)
Daun PeacockRoger Arizmendi  11/24/2017   Your procedure is scheduled on: 12-08-17  Report to [facility] Main  Entr[facility]   Report to admitting at   0730 AM  Call this number if you have problems the morning of surgery [phone]    Remember: Do not eat food or drink liquids :After Midnight.     Take these medicines the morning of surgery with A SIP OF WATER: NONE                                You may not have any metal on your body including hair pins and              piercings  Do not wear jewelry, make-up, lotions, powders or perfumes, deodorant             Do not wear nail polish.  Do not shave  48 hours prior to surgery.              Men may shave face and neck.   Do not bring valuables to the hospital. [place] IS NOT             RESPONSIBLE   FOR VALUABLES.  Contacts, dentures or bridgework may not be worn into surgery.  Leave suitcase in the car. After surgery it may be brought to your room.                Please read over the following fact sheets you were given: _____________________________________________________________________           [facility] - Preparing for Surgery Before surgery, you can play an important role.  Because skin is not sterile, your skin needs to be as free of germs as possible.  You can reduce the number of germs on your skin by washing with CHG (chlorahexidine gluconate) soap before surgery.  CHG is an antiseptic cleaner which kills germs and bonds with the skin to continue killing germs even after washing. Please DO NOT use if you have an allergy to CHG or antibacterial soaps.  If your skin becomes reddened/irritated stop using the CHG and inform your nurse when you arrive at Short Stay. Do not shave (including legs and underarms) for at least 48 hours prior to the first CHG shower.  You may shave your face/neck. Please follow these instructions carefully:  1.  Shower with CHG Soap the night before surgery and the  morning of  Surgery.  2.  If you choose to wash your hair, wash your hair first as usual with your  normal  shampoo.  3.  After you shampoo, rinse your hair and body thoroughly to remove the  shampoo.                           4.  Use CHG as you would any other liquid soap.  You can apply chg directly  to the skin and wash                       Gently with a scrungie or clean washcloth.  5.  Apply the CHG Soap to your body ONLY FROM THE NECK DOWN.   Do not use on face/ open  Wound or open sores. Avoid contact with eyes, ears mouth and genitals (private parts).                       Wash face,  Genitals (private parts) with your normal soap.             6.  Wash thoroughly, paying special attention to the area where your surgery  will be performed.  7.  Thoroughly rinse your body with warm water from the neck down.  8.  DO NOT shower/wash with your normal soap after using and rinsing off  the CHG Soap.                9.  Pat yourself dry with a clean towel.            10.  Wear clean pajamas.            11.  Place clean sheets on your bed the night of your first shower and do not  sleep with pets. Day of Surgery : Do not apply any lotions/deodorants the morning of surgery.  Please wear clean clothes to the hospital/surgery center.  FAILURE TO FOLLOW THESE INSTRUCTIONS MAY RESULT IN THE CANCELLATION OF YOUR SURGERY PATIENT SIGNATURE_________________________________  NURSE SIGNATURE__________________________________  ________________________________________________________________________  WHAT IS A BLOOD TRANSFUSION? Blood Transfusion Information  A transfusion is the replacement of blood or some of its parts. Blood is made up of multiple cells which provide different functions.  Red blood cells carry oxygen and are used for blood loss replacement.  White blood cells fight against infection.  Platelets control bleeding.  Plasma helps clot blood.  Other blood products are  available for specialized needs, such as hemophilia or other clotting disorders. BEFORE THE TRANSFUSION  Who gives blood for transfusions?   Healthy volunteers who are fully evaluated to make sure their blood is safe. This is blood bank blood. Transfusion therapy is the safest it has ever been in the practice of medicine. Before blood is taken from a donor, a complete history is taken to make sure that person has no history of diseases nor engages in risky social behavior (examples are intravenous drug use or sexual activity with multiple partners). The donor's travel history is screened to minimize risk of transmitting infections, such as malaria. The donated blood is tested for signs of infectious diseases, such as HIV and hepatitis. The blood is then tested to be sure it is compatible with you in order to minimize the chance of a transfusion reaction. If you or a relative donates blood, this is often done in anticipation of surgery and is not appropriate for emergency situations. It takes many days to process the donated blood. RISKS AND COMPLICATIONS Although transfusion therapy is very safe and saves many lives, the main dangers of transfusion include:   Getting an infectious disease.  Developing a transfusion reaction. This is an allergic reaction to something in the blood you were given. Every precaution is taken to prevent this. The decision to have a blood transfusion has been considered carefully by your caregiver before blood is given. Blood is not given unless the benefits outweigh the risks. AFTER THE TRANSFUSION  Right after receiving a blood transfusion, you will usually feel much better and more energetic. This is especially true if your red blood cells have gotten low (anemic). The transfusion raises the level of the red blood cells which carry oxygen, and this usually causes an energy increase.  The  nurse administering the transfusion will monitor you carefully for  complications. HOME CARE INSTRUCTIONS  No special instructions are needed after a transfusion. You may find your energy is better. Speak with your caregiver about any limitations on activity for underlying diseases you may have. SEEK MEDICAL CARE IF:   Your condition is not improving after your transfusion.  You develop redness or irritation at the intravenous (IV) site. SEEK IMMEDIATE MEDICAL CARE IF:  Any of the following symptoms occur over the next 12 hours:  Shaking chills.  You have a temperature by mouth above 102 F (38.9 C), not controlled by medicine.  Chest, back, or muscle pain.  People around you feel you are not acting correctly or are confused.  Shortness of breath or difficulty breathing.  Dizziness and fainting.  You get a rash or develop hives.  You have a decrease in urine output.  Your urine turns a dark color or changes to pink, red, or brown. Any of the following symptoms occur over the next 10 days:  You have a temperature by mouth above 102 F (38.9 C), not controlled by medicine.  Shortness of breath.  Weakness after normal activity.  The white part of the eye turns yellow (jaundice).  You have a decrease in the amount of urine or are urinating less often.  Your urine turns a dark color or changes to pink, red, or brown. Document Released: 09/16/2000 Document Revised: 12/12/2011 Document Reviewed: 05/05/2008 ExitCare Patient Information 2014 [place].  _______________________________________________________________________  Incentive Spirometer  An incentive spirometer is a tool that can help keep your lungs clear and active. This tool measures how well you are filling your lungs with each breath. Taking long deep breaths may help reverse or decrease the chance of developing breathing (pulmonary) problems (especially infection) following:  A long period of time when you are unable to move or be active. BEFORE THE PROCEDURE   If  the spirometer includes an indicator to show your best effort, your nurse or respiratory therapist will set it to a desired goal.  If possible, sit up straight or lean slightly forward. Try not to slouch.  Hold the incentive spirometer in an upright position. INSTRUCTIONS FOR USE  1. Sit on the edge of your bed if possible, or sit up as far as you can in bed or on a chair. 2. Hold the incentive spirometer in an upright position. 3. Breathe out normally. 4. Place the mouthpiece in your mouth and seal your lips tightly around it. 5. Breathe in slowly and as deeply as possible, raising the piston or the ball toward the top of the column. 6. Hold your breath for 3-5 seconds or for as long as possible. Allow the piston or ball to fall to the bottom of the column. 7. Remove the mouthpiece from your mouth and breathe out normally. 8. Rest for a few seconds and repeat Steps 1 through 7 at least 10 times every 1-2 hours when you are awake. Take your time and take a few normal breaths between deep breaths. 9. The spirometer may include an indicator to show your best effort. Use the indicator as a goal to work toward during each repetition. 10. After each set of 10 deep breaths, practice coughing to be sure your lungs are clear. If you have an incision (the cut made at the time of surgery), support your incision when coughing by placing a pillow or rolled up towels firmly against it. Once you are able to get  out of bed, walk around indoors and cough well. You may stop using the incentive spirometer when instructed by your caregiver.  RISKS AND COMPLICATIONS  Take your time so you do not get dizzy or light-headed.  If you are in pain, you may need to take or ask for pain medication before doing incentive spirometry. It is harder to take a deep breath if you are having pain. AFTER USE  Rest and breathe slowly and easily.  It can be helpful to keep track of a log of your progress. Your caregiver can  provide you with a simple table to help with this. If you are using the spirometer at home, follow these instructions: [place] IF:   You are having difficultly using the spirometer.  You have trouble using the spirometer as often as instructed.  Your pain medication is not giving enough relief while using the spirometer.  You develop fever of 100.5 F (38.1 C) or higher. SEEK IMMEDIATE MEDICAL CARE IF:   You cough up bloody sputum that had not been present before.  You develop fever of 102 F (38.9 C) or greater.  You develop worsening pain at or near the incision site. MAKE SURE YOU:   Understand these instructions.  Will watch your condition.  Will get help right away if you are not doing well or get worse. Document Released: 01/30/2007 Document Revised: 12/12/2011 Document Reviewed: 04/02/2007 [facility] Patient Information 2014 [place], [place].   ________________________________________________________________________

## 2017-11-30 NOTE — Progress Notes (Signed)
Ekg, cxr  08-22-17 epic

## 2017-12-01 ENCOUNTER — Other Ambulatory Visit: Payer: Self-pay

## 2017-12-01 ENCOUNTER — Encounter (HOSPITAL_COMMUNITY)
Admission: RE | Admit: 2017-12-01 | Discharge: 2017-12-01 | Disposition: A | Discharge status: Home / Self Care | Payer: Medicare Other | Source: Ambulatory Visit | Attending: Orthopedic Surgery | Admitting: Orthopedic Surgery

## 2017-12-01 ENCOUNTER — Ambulatory Visit (HOSPITAL_COMMUNITY)
Admission: RE | Admit: 2017-12-01 | Discharge: 2017-12-01 | Disposition: A | Discharge status: Home / Self Care | Payer: Medicare Other | Source: Ambulatory Visit | Attending: Orthopedic Surgery | Admitting: Orthopedic Surgery

## 2017-12-01 ENCOUNTER — Encounter (HOSPITAL_COMMUNITY): Payer: Self-pay

## 2017-12-01 DIAGNOSIS — Z01818 Encounter for other preprocedural examination: Secondary | ICD-10-CM | POA: Diagnosis not present

## 2017-12-01 DIAGNOSIS — Z01812 Encounter for preprocedural laboratory examination: Secondary | ICD-10-CM | POA: Diagnosis not present

## 2017-12-01 DIAGNOSIS — Z01810 Encounter for preprocedural cardiovascular examination: Secondary | ICD-10-CM | POA: Insufficient documentation

## 2017-12-01 DIAGNOSIS — R918 Other nonspecific abnormal finding of lung field: Secondary | ICD-10-CM | POA: Diagnosis not present

## 2017-12-01 LAB — COMPREHENSIVE METABOLIC PANEL
ALT: 8 U/L — ABNORMAL LOW (ref 17–63)
ANION GAP: 8 (ref 5–15)
AST: 18 U/L (ref 15–41)
Albumin: 3.8 g/dL (ref 3.5–5.0)
Alkaline Phosphatase: 71 U/L (ref 38–126)
BUN: 13 mg/dL (ref 6–20)
CHLORIDE: 108 mmol/L (ref 101–111)
CO2: 25 mmol/L (ref 22–32)
Calcium: 9.1 mg/dL (ref 8.9–10.3)
Creatinine, Ser: 0.7 mg/dL (ref 0.61–1.24)
Glucose, Bld: 87 mg/dL (ref 65–99)
Potassium: 4 mmol/L (ref 3.5–5.1)
SODIUM: 141 mmol/L (ref 135–145)
Total Bilirubin: 0.7 mg/dL (ref 0.3–1.2)
Total Protein: 6.9 g/dL (ref 6.5–8.1)

## 2017-12-01 LAB — CBC WITH DIFFERENTIAL/PLATELET
Basophils Absolute: 0 10*3/uL (ref 0.0–0.1)
Basophils Relative: 0 %
EOS PCT: 1 %
Eosinophils Absolute: 0 10*3/uL (ref 0.0–0.7)
HCT: 37.9 % — ABNORMAL LOW (ref 39.0–52.0)
Hemoglobin: 12.3 g/dL — ABNORMAL LOW (ref 13.0–17.0)
LYMPHS ABS: 2 10*3/uL (ref 0.7–4.0)
Lymphocytes Relative: 52 %
MCH: 25.3 pg — AB (ref 26.0–34.0)
MCHC: 32.5 g/dL (ref 30.0–36.0)
MCV: 78 fL (ref 78.0–100.0)
MONO ABS: 0.3 10*3/uL (ref 0.1–1.0)
MONOS PCT: 9 %
Neutro Abs: 1.4 10*3/uL — ABNORMAL LOW (ref 1.7–7.7)
Neutrophils Relative %: 38 %
PLATELETS: 198 10*3/uL (ref 150–400)
RBC: 4.86 MIL/uL (ref 4.22–5.81)
RDW: 15.2 % (ref 11.5–15.5)
WBC: 3.8 10*3/uL — ABNORMAL LOW (ref 4.0–10.5)

## 2017-12-01 LAB — URINALYSIS, ROUTINE W REFLEX MICROSCOPIC
Bilirubin Urine: NEGATIVE
GLUCOSE, UA: NEGATIVE mg/dL
HGB URINE DIPSTICK: NEGATIVE
Ketones, ur: NEGATIVE mg/dL
Leukocytes, UA: NEGATIVE
Nitrite: NEGATIVE
PH: 7 (ref 5.0–8.0)
PROTEIN: NEGATIVE mg/dL
Specific Gravity, Urine: 1.021 (ref 1.005–1.030)

## 2017-12-01 LAB — SURGICAL PCR SCREEN
MRSA, PCR: NEGATIVE
Staphylococcus aureus: NEGATIVE

## 2017-12-01 LAB — PROTIME-INR
INR: 1.04
PROTHROMBIN TIME: 13.5 s (ref 11.4–15.2)

## 2017-12-01 LAB — ABO/RH: ABO/RH(D): O POS

## 2017-12-01 LAB — APTT: aPTT: 32 seconds (ref 24–36)

## 2017-12-04 NOTE — Progress Notes (Signed)
Final ekg in epic 

## 2017-12-08 ENCOUNTER — Inpatient Hospital Stay (HOSPITAL_COMMUNITY): Payer: Medicare Other | Admitting: Certified Registered"

## 2017-12-08 ENCOUNTER — Inpatient Hospital Stay (HOSPITAL_COMMUNITY): Payer: Medicare Other

## 2017-12-08 ENCOUNTER — Encounter (HOSPITAL_COMMUNITY): Payer: Self-pay | Admitting: *Deleted

## 2017-12-08 ENCOUNTER — Encounter (HOSPITAL_COMMUNITY)
Admission: RE | Disposition: A | Discharge status: Home Health | Payer: Self-pay | Source: Ambulatory Visit | Attending: Orthopedic Surgery

## 2017-12-08 ENCOUNTER — Other Ambulatory Visit: Payer: Self-pay

## 2017-12-08 ENCOUNTER — Inpatient Hospital Stay (HOSPITAL_COMMUNITY)
Admission: RE | Admit: 2017-12-08 | Discharge: 2017-12-09 | DRG: 470 | Disposition: A | Discharge status: Home Health | Payer: Medicare Other | Source: Ambulatory Visit | Attending: Orthopedic Surgery | Admitting: Orthopedic Surgery

## 2017-12-08 DIAGNOSIS — D62 Acute posthemorrhagic anemia: Secondary | ICD-10-CM | POA: Diagnosis not present

## 2017-12-08 DIAGNOSIS — Z96641 Presence of right artificial hip joint: Secondary | ICD-10-CM | POA: Diagnosis not present

## 2017-12-08 DIAGNOSIS — Z471 Aftercare following joint replacement surgery: Secondary | ICD-10-CM | POA: Diagnosis not present

## 2017-12-08 DIAGNOSIS — Z96642 Presence of left artificial hip joint: Secondary | ICD-10-CM | POA: Diagnosis present

## 2017-12-08 DIAGNOSIS — M1611 Unilateral primary osteoarthritis, right hip: Secondary | ICD-10-CM | POA: Diagnosis not present

## 2017-12-08 DIAGNOSIS — M25551 Pain in right hip: Secondary | ICD-10-CM | POA: Diagnosis present

## 2017-12-08 DIAGNOSIS — E559 Vitamin D deficiency, unspecified: Secondary | ICD-10-CM | POA: Diagnosis not present

## 2017-12-08 DIAGNOSIS — M247 Protrusio acetabuli: Secondary | ICD-10-CM | POA: Diagnosis not present

## 2017-12-08 DIAGNOSIS — Z9181 History of falling: Secondary | ICD-10-CM | POA: Diagnosis not present

## 2017-12-08 DIAGNOSIS — Z9884 Bariatric surgery status: Secondary | ICD-10-CM | POA: Diagnosis not present

## 2017-12-08 DIAGNOSIS — Z833 Family history of diabetes mellitus: Secondary | ICD-10-CM | POA: Diagnosis not present

## 2017-12-08 DIAGNOSIS — M1990 Unspecified osteoarthritis, unspecified site: Secondary | ICD-10-CM | POA: Diagnosis not present

## 2017-12-08 DIAGNOSIS — Z96653 Presence of artificial knee joint, bilateral: Secondary | ICD-10-CM | POA: Diagnosis not present

## 2017-12-08 DIAGNOSIS — F17290 Nicotine dependence, other tobacco product, uncomplicated: Secondary | ICD-10-CM | POA: Diagnosis present

## 2017-12-08 DIAGNOSIS — Z419 Encounter for procedure for purposes other than remedying health state, unspecified: Secondary | ICD-10-CM

## 2017-12-08 HISTORY — PX: TOTAL HIP ARTHROPLASTY: SHX124

## 2017-12-08 LAB — TYPE AND SCREEN
ABO/RH(D): O POS
Antibody Screen: NEGATIVE

## 2017-12-08 SURGERY — ARTHROPLASTY, HIP, TOTAL, ANTERIOR APPROACH
Anesthesia: Spinal | Site: Hip | Laterality: Right

## 2017-12-08 MED ORDER — ONDANSETRON HCL 4 MG/2ML IJ SOLN: 4.0000 mg | Freq: Four times a day (QID) | INTRAMUSCULAR | Status: DC | PRN

## 2017-12-08 MED ORDER — LIDOCAINE 2% (20 MG/ML) 5 ML SYRINGE
INTRAMUSCULAR | Status: DC | PRN
  Administered 2017-12-08: 50 mg via INTRAVENOUS
  Administered 2017-12-08: 100 mg via INTRAVENOUS

## 2017-12-08 MED ORDER — ONDANSETRON HCL 4 MG/2ML IJ SOLN
INTRAMUSCULAR | Status: AC
  Filled 2017-12-08: qty 2

## 2017-12-08 MED ORDER — PROPOFOL 10 MG/ML IV BOLUS
INTRAVENOUS | Status: AC
  Filled 2017-12-08: qty 20

## 2017-12-08 MED ORDER — HYDROMORPHONE HCL 1 MG/ML IJ SOLN: 0.2500 mg | INTRAMUSCULAR | Status: DC | PRN

## 2017-12-08 MED ORDER — 0.9 % SODIUM CHLORIDE (POUR BTL) OPTIME
TOPICAL | Status: DC | PRN
  Administered 2017-12-08: 1000 mL

## 2017-12-08 MED ORDER — HYDROCODONE-ACETAMINOPHEN 7.5-325 MG PO TABS
1.0000 | ORAL_TABLET | ORAL | Status: DC | PRN
  Administered 2017-12-08 – 2017-12-09 (×3): 2 via ORAL
  Filled 2017-12-08 (×3): qty 2

## 2017-12-08 MED ORDER — STERILE WATER FOR IRRIGATION IR SOLN
Status: DC | PRN
  Administered 2017-12-08: 2000 mL

## 2017-12-08 MED ORDER — ACETAMINOPHEN 325 MG PO TABS: 325.0000 mg | ORAL_TABLET | Freq: Four times a day (QID) | ORAL | Status: DC | PRN

## 2017-12-08 MED ORDER — FENTANYL CITRATE (PF) 100 MCG/2ML IJ SOLN
INTRAMUSCULAR | Status: DC | PRN
  Administered 2017-12-08: 100 ug via INTRAVENOUS
  Administered 2017-12-08 (×2): 50 ug via INTRAVENOUS

## 2017-12-08 MED ORDER — ONDANSETRON HCL 4 MG PO TABS: 4.0000 mg | ORAL_TABLET | Freq: Four times a day (QID) | ORAL | Status: DC | PRN

## 2017-12-08 MED ORDER — DEXAMETHASONE SODIUM PHOSPHATE 10 MG/ML IJ SOLN
INTRAMUSCULAR | Status: AC
  Filled 2017-12-08: qty 1

## 2017-12-08 MED ORDER — BISACODYL 5 MG PO TBEC: 5.0000 mg | DELAYED_RELEASE_TABLET | Freq: Every day | ORAL | Status: DC | PRN

## 2017-12-08 MED ORDER — CEFAZOLIN SODIUM-DEXTROSE 2-4 GM/100ML-% IV SOLN
2.0000 g | INTRAVENOUS | Status: AC
  Administered 2017-12-08: 2 g via INTRAVENOUS
  Filled 2017-12-08: qty 100

## 2017-12-08 MED ORDER — BUPIVACAINE IN DEXTROSE 0.75-8.25 % IT SOLN
INTRATHECAL | Status: DC | PRN
  Administered 2017-12-08: 2 mL via INTRATHECAL

## 2017-12-08 MED ORDER — PHENYLEPHRINE 40 MCG/ML (10ML) SYRINGE FOR IV PUSH (FOR BLOOD PRESSURE SUPPORT)
PREFILLED_SYRINGE | INTRAVENOUS | Status: DC | PRN
  Administered 2017-12-08: 80 ug via INTRAVENOUS

## 2017-12-08 MED ORDER — BUPIVACAINE HCL (PF) 0.25 % IJ SOLN
INTRAMUSCULAR | Status: DC | PRN
  Administered 2017-12-08: 20 mL

## 2017-12-08 MED ORDER — FENTANYL CITRATE (PF) 100 MCG/2ML IJ SOLN
INTRAMUSCULAR | Status: AC
  Filled 2017-12-08: qty 2

## 2017-12-08 MED ORDER — ONDANSETRON HCL 4 MG/2ML IJ SOLN
INTRAMUSCULAR | Status: DC | PRN
  Administered 2017-12-08: 4 mg via INTRAVENOUS

## 2017-12-08 MED ORDER — DOCUSATE SODIUM 100 MG PO CAPS
100.0000 mg | ORAL_CAPSULE | Freq: Two times a day (BID) | ORAL | Status: DC
  Administered 2017-12-08 – 2017-12-09 (×2): 100 mg via ORAL
  Filled 2017-12-08 (×2): qty 1

## 2017-12-08 MED ORDER — ALUM & MAG HYDROXIDE-SIMETH 200-200-20 MG/5ML PO SUSP: 30.0000 mL | ORAL | Status: DC | PRN

## 2017-12-08 MED ORDER — TRANEXAMIC ACID 1000 MG/10ML IV SOLN
1000.0000 mg | Freq: Once | INTRAVENOUS | Status: AC
  Administered 2017-12-08: 1000 mg via INTRAVENOUS
  Filled 2017-12-08: qty 1100

## 2017-12-08 MED ORDER — PROPOFOL 10 MG/ML IV BOLUS
INTRAVENOUS | Status: AC
  Filled 2017-12-08: qty 40

## 2017-12-08 MED ORDER — MEPERIDINE HCL 50 MG/ML IJ SOLN: 6.2500 mg | INTRAMUSCULAR | Status: DC | PRN

## 2017-12-08 MED ORDER — GABAPENTIN 300 MG PO CAPS
300.0000 mg | ORAL_CAPSULE | Freq: Two times a day (BID) | ORAL | Status: DC
  Administered 2017-12-08 – 2017-12-09 (×3): 300 mg via ORAL
  Filled 2017-12-08 (×3): qty 1

## 2017-12-08 MED ORDER — DEXAMETHASONE SODIUM PHOSPHATE 10 MG/ML IJ SOLN
10.0000 mg | Freq: Two times a day (BID) | INTRAMUSCULAR | Status: AC
  Administered 2017-12-08 – 2017-12-09 (×2): 10 mg via INTRAVENOUS
  Filled 2017-12-08 (×2): qty 1

## 2017-12-08 MED ORDER — TIZANIDINE HCL 2 MG PO TABS: 2.0000 mg | ORAL_TABLET | Freq: Three times a day (TID) | ORAL | 0 refills | Status: DC | PRN

## 2017-12-08 MED ORDER — CEFAZOLIN SODIUM-DEXTROSE 2-4 GM/100ML-% IV SOLN
2.0000 g | Freq: Four times a day (QID) | INTRAVENOUS | Status: AC
  Administered 2017-12-08 (×2): 2 g via INTRAVENOUS
  Filled 2017-12-08 (×2): qty 100

## 2017-12-08 MED ORDER — DIPHENHYDRAMINE HCL 12.5 MG/5ML PO ELIX: 12.5000 mg | ORAL_SOLUTION | ORAL | Status: DC | PRN

## 2017-12-08 MED ORDER — MAGNESIUM CITRATE PO SOLN: 1.0000 | Freq: Once | ORAL | Status: DC | PRN

## 2017-12-08 MED ORDER — ASPIRIN EC 325 MG PO TBEC
325.0000 mg | DELAYED_RELEASE_TABLET | Freq: Two times a day (BID) | ORAL | Status: DC
  Administered 2017-12-08 – 2017-12-09 (×2): 325 mg via ORAL
  Filled 2017-12-08 (×2): qty 1

## 2017-12-08 MED ORDER — BUPIVACAINE HCL (PF) 0.25 % IJ SOLN
INTRAMUSCULAR | Status: AC
  Filled 2017-12-08: qty 30

## 2017-12-08 MED ORDER — POLYETHYLENE GLYCOL 3350 17 G PO PACK: 17.0000 g | PACK | Freq: Every day | ORAL | Status: DC | PRN

## 2017-12-08 MED ORDER — SODIUM CHLORIDE 0.9 % IV SOLN
INTRAVENOUS | Status: DC
  Administered 2017-12-08 – 2017-12-09 (×2): via INTRAVENOUS

## 2017-12-08 MED ORDER — TRANEXAMIC ACID 1000 MG/10ML IV SOLN
1000.0000 mg | INTRAVENOUS | Status: AC
  Administered 2017-12-08: 1000 mg via INTRAVENOUS
  Filled 2017-12-08: qty 1100

## 2017-12-08 MED ORDER — PROMETHAZINE HCL 25 MG/ML IJ SOLN: 6.2500 mg | INTRAMUSCULAR | Status: DC | PRN

## 2017-12-08 MED ORDER — CHLORHEXIDINE GLUCONATE 4 % EX LIQD: 60.0000 mL | Freq: Once | CUTANEOUS | Status: DC

## 2017-12-08 MED ORDER — MIDAZOLAM HCL 2 MG/2ML IJ SOLN
INTRAMUSCULAR | Status: AC
  Filled 2017-12-08: qty 2

## 2017-12-08 MED ORDER — LACTATED RINGERS IV SOLN
INTRAVENOUS | Status: DC
  Administered 2017-12-08 (×3): via INTRAVENOUS

## 2017-12-08 MED ORDER — DEXAMETHASONE SODIUM PHOSPHATE 10 MG/ML IJ SOLN
INTRAMUSCULAR | Status: DC | PRN
  Administered 2017-12-08: 10 mg via INTRAVENOUS

## 2017-12-08 MED ORDER — HYDROCODONE-ACETAMINOPHEN 5-325 MG PO TABS: 1.0000 | ORAL_TABLET | Freq: Four times a day (QID) | ORAL | 0 refills | Status: DC | PRN

## 2017-12-08 MED ORDER — PROPOFOL 500 MG/50ML IV EMUL
INTRAVENOUS | Status: DC | PRN
  Administered 2017-12-08: 100 ug/kg/min via INTRAVENOUS

## 2017-12-08 MED ORDER — METHOCARBAMOL 500 MG PO TABS
500.0000 mg | ORAL_TABLET | Freq: Four times a day (QID) | ORAL | Status: DC | PRN
  Administered 2017-12-08: 500 mg via ORAL
  Filled 2017-12-08: qty 1

## 2017-12-08 MED ORDER — BUPIVACAINE LIPOSOME 1.3 % IJ SUSP
20.0000 mL | Freq: Once | INTRAMUSCULAR | Status: DC
  Filled 2017-12-08: qty 20

## 2017-12-08 MED ORDER — DOCUSATE SODIUM 100 MG PO CAPS: 100.0000 mg | ORAL_CAPSULE | Freq: Two times a day (BID) | ORAL | 0 refills | Status: AC

## 2017-12-08 MED ORDER — LIDOCAINE 2% (20 MG/ML) 5 ML SYRINGE
INTRAMUSCULAR | Status: AC
  Filled 2017-12-08: qty 5

## 2017-12-08 MED ORDER — MIDAZOLAM HCL 5 MG/5ML IJ SOLN
INTRAMUSCULAR | Status: DC | PRN
  Administered 2017-12-08: 2 mg via INTRAVENOUS

## 2017-12-08 MED ORDER — METHOCARBAMOL 1000 MG/10ML IJ SOLN
500.0000 mg | Freq: Four times a day (QID) | INTRAVENOUS | Status: DC | PRN
  Filled 2017-12-08: qty 5

## 2017-12-08 MED ORDER — ASPIRIN EC 325 MG PO TBEC: 325.0000 mg | DELAYED_RELEASE_TABLET | Freq: Two times a day (BID) | ORAL | 0 refills | Status: DC

## 2017-12-08 MED ORDER — BUPIVACAINE LIPOSOME 1.3 % IJ SUSP
INTRAMUSCULAR | Status: DC | PRN
  Administered 2017-12-08: 20 mL

## 2017-12-08 MED ORDER — MORPHINE SULFATE (PF) 2 MG/ML IV SOLN: 0.5000 mg | INTRAVENOUS | Status: DC | PRN

## 2017-12-08 SURGICAL SUPPLY — 40 items
BAG ZIPLOCK 12X15 (MISCELLANEOUS) ×2 IMPLANT
BENZOIN TINCTURE PRP APPL 2/3 (GAUZE/BANDAGES/DRESSINGS) ×2 IMPLANT
BLADE SAW SGTL 18X1.27X75 (BLADE) ×2 IMPLANT
BNDG COHESIVE 6X5 TAN STRL LF (GAUZE/BANDAGES/DRESSINGS) IMPLANT
CAPT HIP TOTAL 2 ×2 IMPLANT
CELLS DAT CNTRL 66122 CELL SVR (MISCELLANEOUS) ×1 IMPLANT
COVER PERINEAL POST (MISCELLANEOUS) ×2 IMPLANT
COVER SURGICAL LIGHT HANDLE (MISCELLANEOUS) ×2 IMPLANT
DRAPE STERI IOBAN 125X83 (DRAPES) ×2 IMPLANT
DRAPE U-SHAPE 47X51 STRL (DRAPES) ×4 IMPLANT
DRSG AQUACEL AG ADV 3.5X 6 (GAUZE/BANDAGES/DRESSINGS) ×2 IMPLANT
DRSG AQUACEL AG ADV 3.5X10 (GAUZE/BANDAGES/DRESSINGS) ×2 IMPLANT
DURAPREP 26ML APPLICATOR (WOUND CARE) ×2 IMPLANT
ELECT REM PT RETURN 15FT ADLT (MISCELLANEOUS) ×2 IMPLANT
GAUZE XEROFORM 1X8 LF (GAUZE/BANDAGES/DRESSINGS) IMPLANT
GLOVE BIO SURGEON STRL SZ 6.5 (GLOVE) ×2 IMPLANT
GLOVE BIOGEL PI IND STRL 6.5 (GLOVE) ×1 IMPLANT
GLOVE BIOGEL PI IND STRL 7.5 (GLOVE) ×3 IMPLANT
GLOVE BIOGEL PI IND STRL 8 (GLOVE) ×2 IMPLANT
GLOVE BIOGEL PI INDICATOR 6.5 (GLOVE) ×1
GLOVE BIOGEL PI INDICATOR 7.5 (GLOVE) ×3
GLOVE BIOGEL PI INDICATOR 8 (GLOVE) ×2
GLOVE ECLIPSE 7.5 STRL STRAW (GLOVE) ×6 IMPLANT
GOWN STRL REUS W/ TWL XL LVL3 (GOWN DISPOSABLE) ×1 IMPLANT
GOWN STRL REUS W/TWL LRG LVL3 (GOWN DISPOSABLE) ×2 IMPLANT
GOWN STRL REUS W/TWL XL LVL3 (GOWN DISPOSABLE) ×5 IMPLANT
HOLDER FOLEY CATH W/STRAP (MISCELLANEOUS) ×2 IMPLANT
HOOD PEEL AWAY FLYTE STAYCOOL (MISCELLANEOUS) ×4 IMPLANT
PACK ANTERIOR HIP CUSTOM (KITS) ×2 IMPLANT
RTRCTR WOUND ALEXIS 18CM MED (MISCELLANEOUS) ×2
STAPLER VISISTAT 35W (STAPLE) IMPLANT
STRIP CLOSURE SKIN 1/2X4 (GAUZE/BANDAGES/DRESSINGS) ×2 IMPLANT
SUT ETHIBOND NAB CT1 #1 30IN (SUTURE) ×4 IMPLANT
SUT MNCRL AB 3-0 PS2 18 (SUTURE) IMPLANT
SUT VIC AB 0 CT1 36 (SUTURE) ×2 IMPLANT
SUT VIC AB 1 CT1 36 (SUTURE) ×4 IMPLANT
SUT VIC AB 2-0 CT1 27 (SUTURE) ×2
SUT VIC AB 2-0 CT1 TAPERPNT 27 (SUTURE) ×2 IMPLANT
TRAY FOLEY W/METER SILVER 16FR (SET/KITS/TRAYS/PACK) ×2 IMPLANT
YANKAUER SUCT BULB TIP NO VENT (SUCTIONS) ×2 IMPLANT

## 2017-12-08 NOTE — Plan of Care (Signed)
Care plan  

## 2017-12-08 NOTE — H&P (Signed)
TOTAL HIP ADMISSION H&P  Patient is admitted for right total hip arthroplasty.  Subjective:  Chief Complaint: right hip pain  HPI: Jordan Bailey, 52 y.o. male, has a history of pain and functional disability in the right hip(s) due to arthritis and patient has failed non-surgical conservative treatments for greater than 12 weeks to include NSAID's and/or analgesics, supervised PT with diminished ADL's post treatment, use of assistive devices, weight reduction as appropriate and activity modification.  Onset of symptoms was gradual starting 5 years ago with gradually worsening course since that time.The patient noted no past surgery on the right hip(s).  Patient currently rates pain in the right hip at 9 out of 10 with activity. Patient has night pain, worsening of pain with activity and weight bearing, trendelenberg gait, pain that interfers with activities of daily living, pain with passive range of motion, crepitus and joint swelling. 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 This condition presents safety issues increasing the risk of falls. This patient has had Failure of all reasonable conservative care.  There is no current active infection.  Patient Active Problem List   Diagnosis Date Noted  . Acute blood loss anemia 08/29/2017  . Primary osteoarthritis of left hip 08/28/2017  . Intrapelvic protrusion of left acetabulum 08/28/2017  . Excessive and redundant skin and subcutaneous tissue 11/08/2016  . Vitamin D deficiency 07/11/2016  . Hypogonadism in male 08/17/2015  . Primary osteoarthritis of right knee 06/22/2015  . Primary osteoarthritis of left knee 03/06/2015  . Obesity 03/06/2015  . Anemia 01/12/2015  . Bariatric surgery status 01/12/2015  . Hammer toe of left foot 12/11/2014  . Osteoarthritis of left hip 12/11/2014  . Osteoarthritis of both knees 11/28/2014  . Rupture of left  quadriceps muscle post repair 11/28/2014   Past Medical History:  Diagnosis Date  . Arthritis   . History of kidney stones   . Iron deficiency anemia     Past Surgical History:  Procedure Laterality Date  . APPLICATION OF A-CELL OF BACK N/A 10/07/2015   Procedure: APPLICATION OF ACELL AND VAC DRESSING TO BACK WOUND;  Surgeon: Wallace Going, DO;  Location: [facility];  Service: Plastics;  Laterality: N/A;  . COLONOSCOPY    . COSMETIC SURGERY     360 degree he had a 360 plastic surgery operation last week where excess skin was removed around his trunkhe had a 360 plastic surgery operation  excess skin was removed around his trunk  . INCISION AND DRAINAGE OF WOUND N/A 10/07/2015   Procedure: DEBRIDEMENT OF BACK WOUND;  Surgeon: Wallace Going, DO;  Location: [place];  Service: Plastics;  Laterality: N/A;  . JOINT REPLACEMENT Bilateral    knees  . 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 REATTACHED TENDON Left 05/2011   LEFT LEG  . TOTAL HIP ARTHROPLASTY Left 08/28/2017   Procedure: TOTAL HIP ARTHROPLASTY ANTERIOR APPROACH;  Surgeon: Dorna Leitz, MD;  Location: [place];  Service: Orthopedics;  Laterality: Left;  . TOTAL HIP ARTHROPLASTY     right Dr. Berenice Primas 12-08-17  . TOTAL KNEE ARTHROPLASTY Left 03/06/2015   Procedure: TOTAL KNEE ARTHROPLASTY;  Surgeon: Dorna Leitz, MD;  Location: [place];  Service: Orthopedics;  Laterality: Left;  . TOTAL KNEE ARTHROPLASTY Right 06/22/2015   Procedure: RIGHT TOTAL KNEE ARTHROPLASTY;  Surgeon: Dorna Leitz, MD;  Location: [facility];  Service: Orthopedics;  Laterality: Right;    Current Facility-Administered  Medications  Medication Dose Route Frequency Provider Last Rate Last Dose  . ceFAZolin (ANCEF) IVPB 2g/100 mL premix  2 g Intravenous On Call to OR Dorna Leitz, MD      . chlorhexidine (HIBICLENS) 4 % liquid 4 application  60 mL Topical Once Dorna Leitz, MD      . lactated ringers infusion   Intravenous  Continuous Nolon Nations, MD      . tranexamic acid (CYKLOKAPRON) 1,000 mg in sodium chloride 0.9 % 100 mL IVPB  1,000 mg Intravenous To OR Dorna Leitz, MD       No Known Allergies  Social History   Tobacco Use  . Smoking status: Light Tobacco Smoker    Years: 26.00    Types: Cigars  . Smokeless tobacco: Never Used  Substance Use Topics  . Alcohol use: No    Alcohol/week: 0.0 oz    Frequency: Never    Family History  Problem Relation Age of Onset  . Diabetes Mother      ROS ROS: I have reviewed the patient's review of systems thoroughly and there are no positive responses as relates to the HPI. Objective:  Physical Exam  Vital signs in last 24 hours: Temp:  [98.4 F (36.9 C)] 98.4 F (36.9 C) (03/08 0654) Pulse Rate:  [56] 56 (03/08 0654) Resp:  [16] 16 (03/08 0654) BP: (125)/(82) 125/82 (03/08 0654) SpO2:  [100 %] 100 % (03/08 0654) Weight:  [101.6 kg (224 lb)] 101.6 kg (224 lb) (03/08 0701) Well-developed well-nourished patient in no acute distress. Alert and oriented x3 HEENT:within normal limits Cardiac: Regular rate and rhythm Pulmonary: Lungs clear to auscultation Abdomen: Soft and nontender.  Normal active bowel sounds  Musculoskeletal: Right hip: Limited range of motion.  Pain with all range of motion.  Neurovascular intact distally. Labs: Recent Results (from the past 2160 hour(s))  Type and screen Order type and screen if day of surgery is less than 15 days from draw of preadmission visit or order morning of surgery if day of surgery is greater than 6 days from preadmission visit.     Status: None   Collection Time: 12/01/17 10:54 AM  Result Value Ref Range   ABO/RH(D) O POS    Antibody Screen NEG    Sample Expiration 12/11/2017    Extend sample reason      NO TRANSFUSIONS OR PREGNANCY IN THE PAST 3 MONTHS Performed at [facility], [place] [address], [place], [place] [address]   ABO/Rh     Status: None   Collection Time: 12/01/17  10:54 AM  Result Value Ref Range   ABO/RH(D)      O POS Performed at [facility], [place] [address], [place], [place] [address]   APTT     Status: None   Collection Time: 12/01/17 10:58 AM  Result Value Ref Range   aPTT 32 24 - 36 seconds    Comment: Performed at [facility], [place] [address] [place], [place] [address]  CBC WITH DIFFERENTIAL     Status: Abnormal   Collection Time: 12/01/17 10:58 AM  Result Value Ref Range   WBC 3.8 (L) 4.0 - 10.5 K/uL   RBC 4.86 4.22 - 5.81 MIL/uL   Hemoglobin 12.3 (L) 13.0 - 17.0 g/dL   HCT 37.9 (L) 39.0 - 52.0 %   MCV 78.0 78.0 - 100.0 fL   MCH 25.3 (L) 26.0 - 34.0 pg   MCHC 32.5 30.0 - 36.0 g/dL  RDW 15.2 11.5 - 15.5 %   Platelets 198 150 - 400 K/uL   Neutrophils Relative % 38 %   Neutro Abs 1.4 (L) 1.7 - 7.7 K/uL   Lymphocytes Relative 52 %   Lymphs Abs 2.0 0.7 - 4.0 K/uL   Monocytes Relative 9 %   Monocytes Absolute 0.3 0.1 - 1.0 K/uL   Eosinophils Relative 1 %   Eosinophils Absolute 0.0 0.0 - 0.7 K/uL   Basophils Relative 0 %   Basophils Absolute 0.0 0.0 - 0.1 K/uL    Comment: Performed at [facility], [place] [address] [place], [place] [address]  Comprehensive metabolic panel     Status: Abnormal   Collection Time: 12/01/17 10:58 AM  Result Value Ref Range   Sodium 141 135 - 145 mmol/L   Potassium 4.0 3.5 - 5.1 mmol/L   Chloride 108 101 - 111 mmol/L   CO2 25 22 - 32 mmol/L   Glucose, Bld 87 65 - 99 mg/dL   BUN 13 6 - 20 mg/dL   Creatinine, Ser 0.70 0.61 - 1.24 mg/dL   Calcium 9.1 8.9 - 10.3 mg/dL   Total Protein 6.9 6.5 - 8.1 g/dL   Albumin 3.8 3.5 - 5.0 g/dL   AST 18 15 - 41 U/L   ALT 8 (L) 17 - 63 U/L   Alkaline Phosphatase 71 38 - 126 U/L   Total Bilirubin 0.7 0.3 - 1.2 mg/dL   GFR calc non Af Amer >60 >60 mL/min   GFR calc Af Amer >60 >60 mL/min    Comment: (NOTE) The eGFR has been calculated using the CKD EPI equation. This calculation has not been validated in  all clinical situations. eGFR's persistently <60 mL/min signify possible Chronic Kidney Disease.    Anion gap 8 5 - 15    Comment: Performed at [facility], [place] [address], [place], [place] [address]  Protime-INR     Status: None   Collection Time: 12/01/17 10:58 AM  Result Value Ref Range   Prothrombin Time 13.5 11.4 - 15.2 seconds   INR 1.04     Comment: Performed at [facility], [place] [address], [place], [place] [address]  Urinalysis, Routine w reflex microscopic     Status: Abnormal   Collection Time: 12/01/17 10:58 AM  Result Value Ref Range   Color, Urine YELLOW YELLOW   APPearance HAZY (A) CLEAR   Specific Gravity, Urine 1.021 1.005 - 1.030   pH 7.0 5.0 - 8.0   Glucose, UA NEGATIVE NEGATIVE mg/dL   Hgb urine dipstick NEGATIVE NEGATIVE   Bilirubin Urine NEGATIVE NEGATIVE   Ketones, ur NEGATIVE NEGATIVE mg/dL   Protein, ur NEGATIVE NEGATIVE mg/dL   Nitrite NEGATIVE NEGATIVE   Leukocytes, UA NEGATIVE NEGATIVE    Comment: Performed at [place] [address] [place], [place] [address]  Surgical pcr screen     Status: None   Collection Time: 12/01/17 10:58 AM  Result Value Ref Range   MRSA, PCR NEGATIVE NEGATIVE   Staphylococcus aureus NEGATIVE NEGATIVE    Comment: (NOTE) The Xpert SA Assay (FDA approved for NASAL specimens in patients 48 years of age and older), is one component of a comprehensive surveillance program. It is not intended to diagnose infection nor to guide or monitor treatment. Performed at [facility], [place] [address] [place],  [address]     Estimated body mass index is 31.24 kg/m as calculated from the following:  Height as of this encounter: _0  (1.803 m).   Weight as of this encounter: 101.6 kg (224 lb).   Imaging Review Plain radiographs demonstrate severe degenerative joint disease of the Severe protrusio acetabulum hip(s). The bone quality appears  to be fair for age and reported activity level.  Assessment/Plan:  End stage arthritis, right hip(s)  The patient history, physical examination, clinical judgement of the provider and imaging studies are consistent with end stage degenerative joint disease of the right hip(s) and total hip arthroplasty is deemed medically necessary. The treatment options including medical management, injection therapy, arthroscopy and arthroplasty were discussed at length. The risks and benefits of total hip arthroplasty were presented and reviewed. The risks due to aseptic loosening, infection, stiffness, dislocation/subluxation,  thromboembolic complications and other imponderables were discussed.  The patient acknowledged the explanation, agreed to proceed with the plan and consent was signed. Patient is being admitted for inpatient treatment for surgery, pain control, PT, OT, prophylactic antibiotics, VTE prophylaxis, progressive ambulation and ADL's and discharge planning.The patient is planning to be discharged home with home health services

## 2017-12-08 NOTE — Transfer of Care (Signed)
Immediate Anesthesia Transfer of Care Note  Patient: Jordan PeacockRoger Bailey  Procedure(s) Performed: RIGHT TOTAL HIP ARTHROPLASTY ANTERIOR (Right Hip)  Patient Location: PACU  Anesthesia Type:Spinal  Level of Consciousness: awake, alert  and oriented  Airway & Oxygen Therapy: Patient Spontanous Breathing and Patient connected to face mask oxygen  Post-op Assessment: Report given to RN and Post -op Vital signs reviewed and stable  Post vital signs: Reviewed and stable  Last Vitals:  Vitals:   12/08/17 0654  BP: 125/82  Pulse: (!) 56  Resp: 16  Temp: 36.9 C  SpO2: 100%    Last Pain:  Vitals:   12/08/17 0654  TempSrc: Oral      Patients Stated Pain Goal: 4 (12/08/17 0701)  Complications: No apparent anesthesia complications

## 2017-12-08 NOTE — Brief Op Note (Signed)
12/08/2017  9:14 PM  PATIENT:  Jordan Bailey  52 y.o. male  PRE-OPERATIVE DIAGNOSIS:  OSTEOARTHRITIS RIGHT HIP  POST-OPERATIVE DIAGNOSIS:  OSTEOARTHRITIS RIGHT HIP  PROCEDURE:  Procedure(s): RIGHT TOTAL HIP ARTHROPLASTY ANTERIOR (Right)  SURGEON:  Surgeon(s) and Role:    Jodi Geralds* Kyanna Mahrt, MD - Primary  PHYSICIAN ASSISTANT:   ASSISTANTS: bethune   ANESTHESIA:   spinal  EBL:  750 mL   BLOOD ADMINISTERED:none  DRAINS: none   LOCAL MEDICATIONS USED:  MARCAINE    and OTHER experel  SPECIMEN:  No Specimen  DISPOSITION OF SPECIMEN:  N/A  COUNTS:  YES  TOURNIQUET:  * No tourniquets in log *  DICTATION: .Other Dictation: Dictation Number [id number]  PLAN OF CARE: Admit to inpatient   PATIENT DISPOSITION:  PACU - hemodynamically stable.   Delay start of Pharmacological VTE agent (>24hrs) due to surgical blood loss or risk of bleeding: no

## 2017-12-08 NOTE — Evaluation (Signed)
Physical Therapy Evaluation Patient Details Name: Jordan PeacockRoger Bailey MRN: [id number] DOB: 04/01/1966 Today's Date: 12/08/2017   History of Present Illness  52 y.o. male admitted for R DA-THA. 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 Pt currently with functional limitations due to the deficits listed below (see PT Problem List). Pt ambulated 180' with RW, performed THA exercises with min A. Pt very motivated, excellent progress expected.  Pt will benefit from skilled PT to increase their independence and safety with mobility to allow discharge to the venue listed below.       Follow Up Recommendations Follow surgeon's recommendation for DC plan and follow-up therapies    Equipment Recommendations  None recommended by PT    Recommendations for Other Services       Precautions / Restrictions Precautions Precautions: Fall Restrictions Weight Bearing Restrictions: No      Mobility  Bed Mobility Overal bed mobility: Modified Independent             General bed mobility comments: HOB up 30*  Transfers Overall transfer level: Modified independent Equipment used: Rolling walker (2 wheeled)                Ambulation/Gait Ambulation/Gait assistance: Supervision Ambulation Distance (Feet): 180 Feet Assistive device: Rolling walker (2 wheeled) Gait Pattern/deviations: Step-through pattern Gait velocity: WFL   General Gait Details: steady with RW  Stairs            Wheelchair Mobility    Modified Rankin (Stroke Patients Only)       Balance Overall balance assessment: Modified Independent                                           Pertinent Vitals/Pain Pain Assessment: No/denies pain    Home Living Family/patient expects to be discharged to:: Private residence Living Arrangements: Children Available Help at Discharge: Family;Available  PRN/intermittently Type of Home: Apartment Home Access: Level entry     Home Layout: One level Home Equipment: Emergency planning/management officerhower seat;Walker - 4 wheels;Walker - 2 wheels;Bedside commode      Prior Function Level of Independence: Independent with assistive device(s)         Comments: walks with cane     Hand Dominance        Extremity/Trunk Assessment        Lower Extremity Assessment Lower Extremity Assessment: LLE deficits/detail;RLE deficits/detail RLE Deficits / Details: knee ext +4/5, hip AAROM WFL LLE Deficits / Details: decreased knee ext 2* prior tendon repair    Cervical / Trunk Assessment Cervical / Trunk Assessment: Normal  Communication   Communication: No difficulties  Cognition Arousal/Alertness: Awake/alert Behavior During Therapy: WFL for tasks assessed/performed Overall Cognitive Status: Within Functional Limits for tasks assessed                                        General Comments      Exercises Total Joint Exercises Ankle Circles/Pumps: AROM;10 reps;Both Heel Slides: AAROM;Right;10 reps;Supine Hip ABduction/ADduction: AAROM;Right;10 reps;Supine Long Arc Quad: AROM;Right;10 reps;Supine   Assessment/Plan    PT Assessment Patient needs continued PT services  PT Problem List Decreased strength;Decreased mobility;Decreased activity tolerance       PT Treatment Interventions Gait training;DME  instruction;Functional mobility training;Therapeutic activities    PT Goals (Current goals can be found in the Care Plan section)  Acute Rehab PT Goals Patient Stated Goal: ride exercise bike PT Goal Formulation: With patient Time For Goal Achievement: 12/15/17 Potential to Achieve Goals: Good    Frequency 7X/week   Barriers to discharge        Co-evaluation               AM-PAC PT "6 Clicks" Daily Activity  Outcome Measure Difficulty turning over in bed (including adjusting bedclothes, sheets and blankets)?:  None Difficulty moving from lying on back to sitting on the side of the bed? : A Little Difficulty sitting down on and standing up from a chair with arms (e.g., wheelchair, bedside commode, etc,.)?: A Little Help needed moving to and from a bed to chair (including a wheelchair)?: A Little Help needed walking in hospital room?: A Little Help needed climbing 3-5 steps with a railing? : A Lot 6 Click Score: 18    End of Session Equipment Utilized During Treatment: Gait belt Activity Tolerance: Patient tolerated treatment well Patient left: in chair;with call bell/phone within reach Nurse Communication: Mobility status PT Visit Diagnosis: Muscle weakness (generalized) (M62.81);Difficulty in walking, not elsewhere classified (R26.2)    Time: 1610-9604 PT Time Calculation (min) (ACUTE ONLY): 21 min   Charges:   PT Evaluation $PT Eval Low Complexity: 1 Low     PT G Codes:          Tamala Ser 12/08/2017, 3:45 PM [phone]

## 2017-12-08 NOTE — Anesthesia Procedure Notes (Addendum)
Spinal  Patient location during procedure: OR End time: 12/08/2017 10:05 AM Staffing Anesthesiologist: Nolon Nations, MD Performed: anesthesiologist  Preanesthetic Checklist Completed: patient identified, site marked, surgical consent, pre-op evaluation, timeout performed, IV checked, risks and benefits discussed and monitors and equipment checked Spinal Block Patient position: sitting Prep: Betadine Patient monitoring: heart rate, continuous pulse ox and blood pressure Approach: right paramedian Location: L3-4 Injection technique: single-shot Needle Needle type: Sprotte  Needle gauge: 24 G Needle length: 9 cm Assessment Sensory level: T6 Additional Notes Expiration date of kit checked and confirmed. Patient tolerated procedure well, without complications.

## 2017-12-08 NOTE — Discharge Instructions (Signed)

## 2017-12-08 NOTE — Anesthesia Preprocedure Evaluation (Addendum)
Anesthesia Evaluation  Patient identified by MRN, date of birth, ID band Patient awake    Reviewed: Allergy & Precautions, NPO status , Patient's Chart, lab work & pertinent test results  Airway Mallampati: II  TM Distance: >3 FB Neck ROM: Full    Dental  (+) Dental Advisory Given   Pulmonary Current Smoker,    breath sounds clear to auscultation       Cardiovascular negative cardio ROS   Rhythm:Regular Rate:Normal     Neuro/Psych negative neurological ROS     GI/Hepatic Neg liver ROS, S/p Gastric sleeve   Endo/Other  negative endocrine ROS  Renal/GU negative Renal ROS     Musculoskeletal  (+) Arthritis ,   Abdominal   Peds  Hematology negative hematology ROS (+) anemia ,   Anesthesia Other Findings   Reproductive/Obstetrics                             Lab Results  Component Value Date   WBC 3.8 (L) 12/01/2017   HGB 12.3 (L) 12/01/2017   HCT 37.9 (L) 12/01/2017   MCV 78.0 12/01/2017   PLT 198 12/01/2017   Lab Results  Component Value Date   CREATININE 0.70 12/01/2017   BUN 13 12/01/2017   NA 141 12/01/2017   K 4.0 12/01/2017   CL 108 12/01/2017   CO2 25 12/01/2017   Lab Results  Component Value Date   INR 1.04 12/01/2017   INR 1.02 08/22/2017   INR 1.12 06/11/2015    Anesthesia Physical  Anesthesia Plan  ASA: II  Anesthesia Plan: Spinal   Post-op Pain Management:    Induction: Intravenous  PONV Risk Score and Plan: 1 and Ondansetron, Propofol infusion and Treatment may vary due to age or medical condition  Airway Management Planned: Natural Airway and Simple Face Mask  Additional Equipment:   Intra-op Plan:   Post-operative Plan:   Informed Consent: I have reviewed the patients History and Physical, chart, labs and discussed the procedure including the risks, benefits and alternatives for the proposed anesthesia with the patient or authorized  representative who has indicated his/her understanding and acceptance.   Dental advisory given  Plan Discussed with: CRNA  Anesthesia Plan Comments:         Anesthesia Quick Evaluation

## 2017-12-08 NOTE — Anesthesia Postprocedure Evaluation (Signed)
Anesthesia Post Note  Patient: Jordan PeacockRoger Nanna  Procedure(s) Performed: RIGHT TOTAL HIP ARTHROPLASTY ANTERIOR (Right Hip)     Patient location during evaluation: PACU Anesthesia Type: Spinal Level of consciousness: awake and alert Pain management: pain level controlled Vital Signs Assessment: post-procedure vital signs reviewed and stable Respiratory status: spontaneous breathing and respiratory function stable Cardiovascular status: blood pressure returned to baseline and stable Postop Assessment: spinal receding Anesthetic complications: no    Last Vitals:  Vitals:   12/08/17 1400 12/08/17 1500  BP: 91/76 (!) 136/97  Pulse: (!) 45 60  Resp: 12 14  Temp: 36.6 C 36.6 C  SpO2: 96% 100%    Last Pain:  Vitals:   12/08/17 1500  TempSrc: Oral  PainSc:                  Lewie LoronJohn Nykolas Bacallao

## 2017-12-09 LAB — CBC
HEMATOCRIT: 33.6 % — AB (ref 39.0–52.0)
Hemoglobin: 10.7 g/dL — ABNORMAL LOW (ref 13.0–17.0)
MCH: 24.7 pg — AB (ref 26.0–34.0)
MCHC: 31.8 g/dL (ref 30.0–36.0)
MCV: 77.6 fL — AB (ref 78.0–100.0)
PLATELETS: 190 10*3/uL (ref 150–400)
RBC: 4.33 MIL/uL (ref 4.22–5.81)
RDW: 14.9 % (ref 11.5–15.5)
WBC: 11.9 10*3/uL — ABNORMAL HIGH (ref 4.0–10.5)

## 2017-12-09 LAB — BASIC METABOLIC PANEL
ANION GAP: 6 (ref 5–15)
BUN: 10 mg/dL (ref 6–20)
CALCIUM: 8.7 mg/dL — AB (ref 8.9–10.3)
CO2: 26 mmol/L (ref 22–32)
Chloride: 107 mmol/L (ref 101–111)
Creatinine, Ser: 0.85 mg/dL (ref 0.61–1.24)
Glucose, Bld: 152 mg/dL — ABNORMAL HIGH (ref 65–99)
Potassium: 4.3 mmol/L (ref 3.5–5.1)
SODIUM: 139 mmol/L (ref 135–145)

## 2017-12-09 NOTE — Care Management Note (Signed)
Case Management Note  Patient Details  Name: Jordan Bailey MRN: [id number] Date of Birth: 05/01/1966  Subjective/Objective:   Right THA                 Action/Plan: [facility] Pt has RW and 3n1 bedside commode at home. Pt requesting [facility] for HH. States he had [facility] in the past. Contacted [facility] with new referral.    Expected Discharge Date:  12/09/17               Expected Discharge Plan:  Home w Home Health Services  In-House Referral:  NA  Discharge planning Services  CM Consult  Post Acute Care Choice:  Home Health Choice offered to:  Patient  DME Arranged:  N/A DME Agency:  NA  HH Arranged:  PT HH Agency:  [facility]  Status of Service:  Completed, signed off  If discussed at Long Length of Stay Meetings, dates discussed:    Additional Comments:  Elliot CousinShavis, Wynn Alldredge Ellen, RN 12/09/2017, 10:12 AM

## 2017-12-09 NOTE — Progress Notes (Signed)
Physical Therapy Treatment Patient Details Name: Jordan PeacockRoger Iodice MRN: [id number] DOB: 02/10/1966 Today's Date: 12/09/2017    History of Present Illness 52 y.o. male admitted for R DA-THA. 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 Reviewed exercises and gait training. All education completed. Okay to d/c from PT standpoint-made RN aware.    Follow Up Recommendations  Follow surgeon's recommendation for DC plan and follow-up therapies     Equipment Recommendations  None recommended by PT    Recommendations for Other Services       Precautions / Restrictions Precautions Precautions: Fall Restrictions Weight Bearing Restrictions: No RLE Weight Bearing: Weight bearing as tolerated    Mobility  Bed Mobility               General bed mobility comments: oob in recliner  Transfers Overall transfer level: Needs assistance Equipment used: Rolling walker (2 wheeled) Transfers: Sit to/from Stand Sit to Stand: Supervision         General transfer comment: for safety  Ambulation/Gait Ambulation/Gait assistance: Supervision Ambulation Distance (Feet): 300 Feet Assistive device: Rolling walker (2 wheeled) Gait Pattern/deviations: Step-through pattern     General Gait Details: steady with RW   Stairs            Wheelchair Mobility    Modified Rankin (Stroke Patients Only)       Balance                                            Cognition Arousal/Alertness: Awake/alert Behavior During Therapy: WFL for tasks assessed/performed Overall Cognitive Status: Within Functional Limits for tasks assessed                                        Exercises Total Joint Exercises Hip ABduction/ADduction: Right;10 reps;Supine;AROM Long Arc Quad: AROM;Right;10 reps;Supine Knee Flexion: AROM;Right;10 reps;Standing Marching in Standing:  AROM;Both;10 reps;Standing    General Comments        Pertinent Vitals/Pain Pain Assessment: 0-10 Pain Score: 3  Pain Location: R hip Pain Descriptors / Indicators: Sore Pain Intervention(s): Monitored during session    Home Living                      Prior Function            PT Goals (current goals can now be found in the care plan section) Progress towards PT goals: Progressing toward goals    Frequency    7X/week      PT Plan      Co-evaluation              AM-PAC PT "6 Clicks" Daily Activity  Outcome Measure  Difficulty turning over in bed (including adjusting bedclothes, sheets and blankets)?: None Difficulty moving from lying on back to sitting on the side of the bed? : None Difficulty sitting down on and standing up from a chair with arms (e.g., wheelchair, bedside commode, etc,.)?: A Little Help needed moving to and from a bed to chair (including a wheelchair)?: A Little Help needed walking in hospital room?: A Little Help needed climbing 3-5 steps with a railing? : A Little 6 Click Score: 20  End of Session   Activity Tolerance: Patient tolerated treatment well Patient left: in chair;with call bell/phone within reach   PT Visit Diagnosis: Muscle weakness (generalized) (M62.81);Difficulty in walking, not elsewhere classified (R26.2)     Time: 4098-1191 PT Time Calculation (min) (ACUTE ONLY): 10 min  Charges:  $Gait Training: 8-22 mins                    G Codes:          Rebeca Alert, MPT Pager: [phone]

## 2017-12-09 NOTE — Discharge Summary (Signed)
Patient ID: Jordan Bailey MRN: [id number] DOB/AGE: 1966-08-26 52 y.o.  Admit date: 12/08/2017 Discharge date: 12/09/2017  Admission Diagnoses:  Principal Problem:   Primary osteoarthritis of right hip   Discharge Diagnoses:  Same  Past Medical History:  Diagnosis Date  . Arthritis   . History of kidney stones   . Iron deficiency anemia     Surgeries: Procedure(s): RIGHT TOTAL HIP ARTHROPLASTY ANTERIOR on 12/08/2017   Consultants:   Discharged Condition: Improved  Hospital Course: Jordan Bailey is an 52 y.o. male who was admitted 12/08/2017 for operative treatment ofPrimary osteoarthritis of right hip. Patient has severe unremitting pain that affects sleep, daily activities, and work/hobbies. After pre-op clearance the patient was taken to the operating room on 12/08/2017 and underwent  Procedure(s): RIGHT TOTAL HIP ARTHROPLASTY ANTERIOR.    Patient was given perioperative antibiotics:  Anti-infectives (From admission, onward)   Start     Dose/Rate Route Frequency Ordered Stop   12/08/17 1600  ceFAZolin (ANCEF) IVPB 2g/100 mL premix     2 g 200 mL/hr over 30 Minutes Intravenous Every 6 hours 12/08/17 1414 12/08/17 2224   12/08/17 0637  ceFAZolin (ANCEF) IVPB 2g/100 mL premix     2 g 200 mL/hr over 30 Minutes Intravenous On call to O.R. 12/08/17 4098 12/08/17 1007       Patient was given sequential compression devices, early ambulation, and chemoprophylaxis to prevent DVT.  Patient benefited maximally from hospital stay and there were no complications.    Recent vital signs:  Patient Vitals for the past 24 hrs:  BP Temp Temp src Pulse Resp SpO2  12/09/17 0527 (!) 145/85 98.4 F (36.9 C) Oral 64 18 100 %  12/09/17 0116 126/61 98.1 F (36.7 C) Oral 68 18 98 %  12/08/17 2100 133/73 97.8 F (36.6 C) Oral (!) 58 18 100 %  12/08/17 1700 (!) 145/75 98 F (36.7 C) Oral 62 16 100 %     Recent laboratory studies:  Recent Labs    12/09/17 0516  WBC 11.9*  HGB 10.7*  HCT 33.6*   PLT 190  NA 139  K 4.3  CL 107  CO2 26  BUN 10  CREATININE 0.85  GLUCOSE 152*  CALCIUM 8.7*     Discharge Medications:   Allergies as of 12/09/2017   No Known Allergies     Medication List    TAKE these medications   aspirin EC 325 MG tablet Take 1 tablet (325 mg total) by mouth 2 (two) times daily after a meal. Take x 1 month post op to decrease risk of blood clots.   B-12 DOTS PO Take 3 each by mouth every other day.   BIOTIN PO Take 1 tablet by mouth daily.   docusate sodium 100 MG capsule Commonly known as:  COLACE Take 1 capsule (100 mg total) by mouth 2 (two) times daily. What changed:  when to take this   ferrous sulfate 325 (65 FE) MG tablet Take 1 tablet (325 mg total) by mouth 3 (three) times daily with meals.   HYDROcodone-acetaminophen 5-325 MG tablet Commonly known as:  NORCO Take 1-2 tablets by mouth every 6 (six) hours as needed for moderate pain.   multivitamin with minerals tablet Take 1 tablet by mouth daily.   oxyCODONE-acetaminophen 5-325 MG tablet Commonly known as:  PERCOCET/ROXICET Take 1-2 tablets by mouth every 6 (six) hours as needed for severe pain. What changed:    how much to take  when to take this  tiZANidine 2 MG tablet Commonly known as:  ZANAFLEX Take 1 tablet (2 mg total) by mouth every 8 (eight) hours as needed for muscle spasms. What changed:  Another medication with the same name was added. Make sure you understand how and when to take each.   tiZANidine 2 MG tablet Commonly known as:  ZANAFLEX Take 1 tablet (2 mg total) by mouth every 8 (eight) hours as needed for muscle spasms. What changed:  You were already taking a medication with the same name, and this prescription was added. Make sure you understand how and when to take each.            Discharge Care Instructions  (From admission, onward)        Start     Ordered   12/09/17 0000  Weight bearing as tolerated    Question Answer Comment   Laterality right   Extremity Lower      12/09/17 0935      Diagnostic Studies: Dg Chest 2 View  Result Date: 12/01/2017 CLINICAL DATA:  Preoperative evaluation for RIGHT hip replacement, occasional smoker EXAM: CHEST  2 VIEW COMPARISON:  08/22/2017 FINDINGS: Normal heart size, mediastinal contours, and pulmonary vascularity. Lungs clear. Lucency projecting over the lower lateral RIGHT chest likely reflects asymmetry of the overlying chest wall soft tissues on the PA view is not seen on the lateral view. No pleural effusion or pneumothorax. Bones unremarkable. IMPRESSION: No acute abnormalities. Electronically Signed   By: Ulyses SouthwardMark  Boles M.D.   On: 12/01/2017 13:03   Dg Pelvis Portable  Result Date: 12/08/2017 CLINICAL DATA:  Right hip replacement EXAM: PORTABLE PELVIS 1-2 VIEWS COMPARISON:  08/28/2017 FINDINGS: Right hip replacement with satisfactory alignment. No fracture or dislocation Chronic left hip replacement also appears satisfactory IMPRESSION: Satisfactory right hip replacement. Electronically Signed   By: Marlan Palauharles  Clark M.D.   On: 12/08/2017 13:49   Dg C-arm 1-60 Min-no Report  Result Date: 12/08/2017 Fluoroscopy was utilized by the requesting physician.  No radiographic interpretation.   Dg Hip Port Unilat With Pelvis 1v Right  Result Date: 12/08/2017 CLINICAL DATA:  Postop right hip EXAM: DG HIP (WITH OR WITHOUT PELVIS) 1V PORT RIGHT COMPARISON:  None. FINDINGS: Right total hip arthroplasty without failure or complication. Postsurgical changes in the surrounding soft tissues. IMPRESSION: Interval right total hip arthroplasty. Electronically Signed   By: Elige KoHetal  Patel   On: 12/08/2017 14:18   Dg Hip Operative Unilat W Or W/o Pelvis Right  Result Date: 12/08/2017 CLINICAL DATA:  Right hip replacement EXAM: OPERATIVE RIGHT HIP (WITH PELVIS IF PERFORMED) 2 VIEWS TECHNIQUE: Fluoroscopic spot image(s) were submitted for interpretation post-operatively. COMPARISON:  None FLUOROSCOPY TIME:  24  seconds FINDINGS: Interval right total hip arthroplasty without failure or complication. Postsurgical changes in the surrounding soft tissues. IMPRESSION: Interval right total hip arthroplasty. Electronically Signed   By: Elige KoHetal  Patel   On: 12/08/2017 12:15    Disposition: 06-Home-Health Care Svc  Discharge Instructions    Call MD / Call 911   Complete by:  As directed    If you experience chest pain or shortness of breath, CALL 911 and be transported to the hospital emergency room.  If you develope a fever above 101 F, pus (white drainage) or increased drainage or redness at the wound, or calf pain, call your surgeon's office.   Diet general   Complete by:  As directed    Do not sit on low chairs, stoools or toilet seats, as it may be  difficult to get up from low surfaces   Complete by:  As directed    Increase activity slowly as tolerated   Complete by:  As directed    Weight bearing as tolerated   Complete by:  As directed    Laterality:  right   Extremity:  Lower      Follow-up Information    Jodi Geralds, MD. Schedule an appointment as soon as possible for a visit in 2 week(s).   Specialty:  Orthopedic Surgery Contact information: [address] [place] [address] [phone]        [facility] Follow up.   Specialty:  Home Health Services Why:  Home Health Physical Therapy-agency will call to arrange initial visit Contact information: [address] [place] [address] [phone]            Signed: Matthew Folks 12/09/2017, 4:50 PM

## 2017-12-09 NOTE — Progress Notes (Signed)
Subjective: 1 Day Post-Op Procedure(s) (LRB): RIGHT TOTAL HIP ARTHROPLASTY ANTERIOR (Right) Patient reports pain as mild.    Objective: Vital signs in last 24 hours: Temp:  [97.4 F (36.3 C)-98.4 F (36.9 C)] 98.4 F (36.9 C) (03/09 0527) Pulse Rate:  [41-68] 64 (03/09 0527) Resp:  [5-18] 18 (03/09 0527) BP: (91-153)/(47-97) 145/85 (03/09 0527) SpO2:  [92 %-100 %] 100 % (03/09 0527) Weight:  [101.6 kg (223 lb 15.8 oz)] 101.6 kg (223 lb 15.8 oz) (03/08 1416)  Intake/Output from previous day: 03/08 0701 - 03/09 0700 In: 5708.3 [P.O.:960; I.V.:4038.3; IV Piggyback:310] Out: 1745 [Urine:995; Blood:750] Intake/Output this shift: No intake/output data recorded.  Recent Labs    12/09/17 0516  HGB 10.7*   Recent Labs    12/09/17 0516  WBC 11.9*  RBC 4.33  HCT 33.6*  PLT 190   Recent Labs    12/09/17 0516  NA 139  K 4.3  CL 107  CO2 26  BUN 10  CREATININE 0.85  GLUCOSE 152*  CALCIUM 8.7*   No results for input(s): LABPT, INR in the last 72 hours.  Neurologically intact ABD soft Neurovascular intact Sensation intact distally No cellulitis present Compartment soft  Assessment/Plan: 1 Day Post-Op Procedure(s) (LRB): RIGHT TOTAL HIP ARTHROPLASTY ANTERIOR (Right) Advance diet Up with therapy Discharge home with home health  Patient is doing well status post total hip replacement.  He will be discharged home today in follow-up in the office in a couple weeks.  He knows to call should issues arise at home.  Harvie JuniorJohn L Elsbeth Yearick 12/09/2017, 8:37 AM

## 2017-12-10 DIAGNOSIS — Z7982 Long term (current) use of aspirin: Secondary | ICD-10-CM | POA: Diagnosis not present

## 2017-12-10 DIAGNOSIS — Z96653 Presence of artificial knee joint, bilateral: Secondary | ICD-10-CM | POA: Diagnosis not present

## 2017-12-10 DIAGNOSIS — Z9884 Bariatric surgery status: Secondary | ICD-10-CM | POA: Diagnosis not present

## 2017-12-10 DIAGNOSIS — Z471 Aftercare following joint replacement surgery: Secondary | ICD-10-CM | POA: Diagnosis not present

## 2017-12-10 DIAGNOSIS — D649 Anemia, unspecified: Secondary | ICD-10-CM | POA: Diagnosis not present

## 2017-12-10 DIAGNOSIS — Z96643 Presence of artificial hip joint, bilateral: Secondary | ICD-10-CM | POA: Diagnosis not present

## 2017-12-11 NOTE — Op Note (Signed)
NAME:  Jordan Bailey, [name]                ACCOUNT NO.:  [id number]  MEDICAL RECORD NO.:  [id number]  LOCATION:                                 FACILITY:  PHYSICIAN:  Harvie JuniorJohn L. Basilia Stuckert, M.D.   DATE OF BIRTH:  [birth year]  DATE OF PROCEDURE:  12/08/2017 DATE OF DISCHARGE:  12/09/2017                              OPERATIVE REPORT   The patient is a 52 year old male on the Orthopedic Surgery Service.  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  PROCEDURES PERFORMED: 1. Right total hip replacement with a Corail stem size 12, a 54-mm     Pinnacle Sector Gription cup, a 36-mm +0 hip ball, and a +4 neutral     liner. 2. Interpretation of multiple intraoperative fluoroscopic images.  SURGEON:  Harvie JuniorJohn L. Shiquan Mathieu, MD.  ASSISTANT:  Marshia LyJames Bethune, PA.  ANESTHESIA:  General.  BRIEF HISTORY:  Mr. 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  He has been treated with bilateral knee replacement and left hip replacement.  He has done well with all of these procedures.  His right hip had severe protrusio acetabulum and severe arthritis.  After failure of all conservative care, he was taken to the operating room for a right total hip replacement.  DESCRIPTION OF PROCEDURE:  The patient was brought to the operating room, and after adequate anesthesia was obtained with a spinal anesthetic, the patient was placed supine on the bed and then moved onto the Hana bed.  All bony prominences were well padded.  Attention was then turned to the right hip.  The incision was made for an anterior approach to the hip after routine prep and drape, and following this, attention was turned to the tensor fascia, which was divided in line with its  fibers.  The muscle was finger-dissected and retractors were put in place above and below the femoral neck.  The capsule was then opened and tagged, and the provisional neck cut was made.  This was followed by placement of retractors for exposure of the acetabulum, and a labrectomy was performed.  At this time, I used a curette to carefully scrape the inner portion of the protrusio, and then we used a reamer; initially, a fairly large size reamer of 51 mm, and put it in the exact position that we wanted the cup.  We brought in fluoro to ensure that were in the exact position of 45 degrees of lateral opening and 30 degrees of anteversion.  Once we got this, we then went to one additional reamer of 53, and this was getting excellent circumferential bite.  At this point, it became relatively clear that the protrusio was not as severe as anticipated.  We went to the back table, and took tremendous amounts of graft from the head, and then put this graft back up behind the cup.  Following this, the Sector Gription cup was used with the holes in the superior position.  Once this was  done, we hammered it into place, and had such a nice rim fit that I did not feel that we needed to put any dome screws in.  It really was a tremendous rim fit.  At this point, the excess graft that had come to the screw holes was removed, and a +4 neutral liner was placed.  Attention was then turned to the stem side.  Retractors were put in place.  We initially put an 8 stem in after a cookie cutter and chili pepper.  At this point, the 8 was really struggling to get down, and I wanted to take a look at what it was.  It appeared to have penetrated out the back.  We pulled the 8 out and took the 9 back, and then took the 8 back and carefully manipulated down the canal.  We then got fluoro in again to make sure that we were in the canal.  We were, and we then sequentially rasped with the 9, 10, and 11.  Once we were  satisfied that the 11 was an excellent fit, we trialed it.  The +0 ball actually gave symmetric leg length to the opposite side and we were satisfied with this.  We then had gone up to the 12-level implant and we were satisfied that it was centered.  We opened the 12 and placed it through the +0 trial ball.  This had symmetric leg lengths.  WOUND CLOSURE:  With the final ball in place and excellent range of motion and stability were achieved, the capsule was closed with 1 Vicryl running, the tensor fascia was closed with 0 Vicryl running, and the skin was closed with 0 and 2-0 Vicryl and skin staples.  A sterile compressive dressing was applied.  CONDITION:  The patient was taken to the recovery room, where he was noted to be in satisfactory condition.  ESTIMATED BLOOD LOSS:  The estimated blood loss for this procedure was 750 mL, but the final can be gotten from the anesthetic record.     Harvie Junior, M.D.     Ranae Plumber  D:  12/08/2017  T:  12/09/2017  Job:  [id number]

## 2017-12-19 ENCOUNTER — Telehealth: Payer: Self-pay | Admitting: Family Medicine

## 2017-12-19 DIAGNOSIS — Z9884 Bariatric surgery status: Secondary | ICD-10-CM | POA: Diagnosis not present

## 2017-12-19 DIAGNOSIS — Z471 Aftercare following joint replacement surgery: Secondary | ICD-10-CM | POA: Diagnosis not present

## 2017-12-19 DIAGNOSIS — Z7982 Long term (current) use of aspirin: Secondary | ICD-10-CM | POA: Diagnosis not present

## 2017-12-19 DIAGNOSIS — Z96653 Presence of artificial knee joint, bilateral: Secondary | ICD-10-CM | POA: Diagnosis not present

## 2017-12-19 DIAGNOSIS — Z96643 Presence of artificial hip joint, bilateral: Secondary | ICD-10-CM | POA: Diagnosis not present

## 2017-12-19 DIAGNOSIS — D649 Anemia, unspecified: Secondary | ICD-10-CM | POA: Diagnosis not present

## 2017-12-19 NOTE — Telephone Encounter (Signed)
Patient needs an appointment to discuss the cologuard. Otherwise he can get it done when he comes in for his annual. Estelle JuneRhonda Cunningham,CMA

## 2017-12-19 NOTE — Telephone Encounter (Signed)
Pt called and states that he got a automated call letting him know that he is due for CPE and also a colonoscopy but he would like to have a phone call when he can pick up the kit for colo guard  to be picked up. Thanks Pt stated he will call back to schedule a CPE

## 2017-12-20 NOTE — Telephone Encounter (Signed)
Pt advised.

## 2017-12-22 DIAGNOSIS — M1611 Unilateral primary osteoarthritis, right hip: Secondary | ICD-10-CM | POA: Diagnosis not present

## 2017-12-28 DIAGNOSIS — M1611 Unilateral primary osteoarthritis, right hip: Secondary | ICD-10-CM | POA: Diagnosis not present

## 2018-01-02 ENCOUNTER — Ambulatory Visit (INDEPENDENT_AMBULATORY_CARE_PROVIDER_SITE_OTHER): Payer: Medicare Other | Admitting: Family Medicine

## 2018-01-02 ENCOUNTER — Encounter: Payer: Self-pay | Admitting: Family Medicine

## 2018-01-02 DIAGNOSIS — S9301XA Subluxation of right ankle joint, initial encounter: Secondary | ICD-10-CM

## 2018-01-02 NOTE — Progress Notes (Signed)
    Orthotics Note:   Patient was fitted for a : standard, cushioned, semi-rigid orthotic. The orthotic was heated and afterward the patient stood on the orthotic blank positioned on the orthotic stand. The patient was positioned in subtalar neutral position and 10 degrees of ankle dorsiflexion in a weight bearing stance. After completion of molding, a stable base was applied to the orthotic blank. The blank was ground to a stable position for weight bearing. Size: 13 Base: [facility] Additional Posting and Padding: Significantly shaved down right orthotic to help combat severe subluxation.  This helped some The patient ambulated these, and they were very comfortable.  I spent 40 minutes with this patient independent of orthotic preparation, greater than 50% was face-to-face time counseling regarding risks and benefits of orthotics expected side effects in addition to what type of shoes or compatible with semirigid orthotics as well as expected lifetime of orthotics.

## 2018-01-02 NOTE — Patient Instructions (Signed)
Thank you for coming in today. Let me know if you have any lumps or bumps.  We can drain that seroma but I recommend we defer to your orthopedist.  Consider an ankle brace with the orthotic.   If not better let me know.

## 2018-01-11 DIAGNOSIS — M1611 Unilateral primary osteoarthritis, right hip: Secondary | ICD-10-CM | POA: Diagnosis not present

## 2018-03-01 ENCOUNTER — Telehealth: Payer: Self-pay | Admitting: Family Medicine

## 2018-03-01 ENCOUNTER — Ambulatory Visit (INDEPENDENT_AMBULATORY_CARE_PROVIDER_SITE_OTHER): Payer: Medicare Other | Admitting: Family Medicine

## 2018-03-01 ENCOUNTER — Encounter: Payer: Self-pay | Admitting: Family Medicine

## 2018-03-01 VITALS — BP 136/74 | HR 59 | Ht 68.5 in | Wt 237.0 lb

## 2018-03-01 DIAGNOSIS — R609 Edema, unspecified: Secondary | ICD-10-CM

## 2018-03-01 DIAGNOSIS — Z9884 Bariatric surgery status: Secondary | ICD-10-CM

## 2018-03-01 DIAGNOSIS — D508 Other iron deficiency anemias: Secondary | ICD-10-CM | POA: Diagnosis not present

## 2018-03-01 DIAGNOSIS — R5383 Other fatigue: Secondary | ICD-10-CM | POA: Diagnosis not present

## 2018-03-01 DIAGNOSIS — M7989 Other specified soft tissue disorders: Secondary | ICD-10-CM

## 2018-03-01 DIAGNOSIS — E538 Deficiency of other specified B group vitamins: Secondary | ICD-10-CM | POA: Diagnosis not present

## 2018-03-01 DIAGNOSIS — Z5181 Encounter for therapeutic drug level monitoring: Secondary | ICD-10-CM

## 2018-03-01 MED ORDER — FUROSEMIDE 40 MG PO TABS: 40.0000 mg | ORAL_TABLET | Freq: Every day | ORAL | 3 refills | Status: DC

## 2018-03-01 NOTE — Progress Notes (Signed)
Jordan Bailey is a 52 y.o. male who presents to [facility]: [facility] Medicine today for leg swelling.  Tivis notes bilateral leg swelling.  This is been ongoing now for a few weeks.  He denies any shortness of breath chest pain or palpitations.  He denies any orthopnea.  He cannot think of any inciting events or behavior change.  He does note that he has been using his compression stockings less than usual because it summer now he is wearing shorts.  In the past he had some leg swelling after surgery and was prescribed what he thinks his furosemide.  He notes these prescription pills are quite old and he is tried taking with few times in notes the not very effective at all.  He feels well otherwise.  No significant recent immobilization or injury to his legs.  Patient notes he is slightly fatigued.  Additionally he notes a history of iron deficiency anemia following his gastric bypass surgery.  He is taking iron 3 times daily.  He additionally is continuing to use B12 sublingually.  He denies a history of hypothyroidism.  ROS as above:  Exam:  BP 136/74   Pulse (!) 59   Ht 5' 8.5" (1.74 m)   Wt 237 lb (107.5 kg)   BMI 35.51 kg/m   Wt Readings from Last 5 Encounters:  03/01/18 237 lb (107.5 kg)  01/02/18 225 lb (102.1 kg)  12/08/17 223 lb 15.8 oz (101.6 kg)  12/01/17 224 lb (101.6 kg)  08/28/17 211 lb (95.7 kg)    Gen: Well NAD HEENT: EOMI,  MMM Lungs: Normal work of breathing. CTABL Heart: RRR no MRG Abd: NABS, Soft. Nondistended, Nontender Exts: 2+ edema bilateral lower extremities to mid tibia.  Nontender.  Capillary refill and pulses are intact.  Lab and Radiology Results   Chemistry      Component Value Date/Time   NA 139 12/09/2017 0516   K 4.3 12/09/2017 0516   CL 107 12/09/2017 0516   CO2 26 12/09/2017 0516   BUN 10 12/09/2017 0516   CREATININE 0.85 12/09/2017 0516   CREATININE 0.83 07/08/2016 0931      Component Value Date/Time   CALCIUM 8.7 (L) 12/09/2017 0516   ALKPHOS 71 12/01/2017 1058   AST 18 12/01/2017 1058   ALT 8 (L) 12/01/2017 1058   BILITOT 0.7 12/01/2017 1058        Assessment and Plan: 52 y.o. male with  Bilateral lower extremity edema.  Symmetrical.  DVT is extremely unlikely.  Heart failure kidney failure also very doubtful given no orthopnea and normal kidney function when checked in March 2019.  The top of the differential is dependent edema.  Patient stopped using his compression stockings as much I think that is probably the cause.  Regardless we will proceed with further work-up.  Plan to check CBC iron stores metabolic panel B12 and TSH to evaluate worsening kidney function elect light disturbance severe anemia or iron deficiency severe hypothyroidism which can cause edema or B12 deficiency which certainly can cause anemia.  Additionally will start treatment with furosemide and compression stockings.  Recheck in a few weeks.  Return sooner if needed.   Orders Placed This Encounter  Procedures  . CBC  . Fe+TIBC+Fer  . COMPLETE METABOLIC PANEL WITH GFR  . Vitamin B12  . TSH   Meds ordered this encounter  Medications  . furosemide (LASIX) 40 MG tablet    Sig: Take 1  tablet (40 mg total) by mouth daily.    Dispense:  30 tablet    Refill:  3     Historical information moved to improve visibility of documentation.  Past Medical History:  Diagnosis Date  . Arthritis   . History of kidney stones   . Iron deficiency anemia    Past Surgical History:  Procedure Laterality Date  . APPLICATION OF A-CELL OF BACK N/A 10/07/2015   Procedure: APPLICATION OF ACELL AND VAC DRESSING TO BACK WOUND;  Surgeon: Peggye Form, DO;  Location: [facility];  Service: Plastics;  Laterality: N/A;  . COLONOSCOPY    . COSMETIC SURGERY     360 degree he had a 360 plastic surgery operation last week where excess skin was  removed around his trunkhe had a 360 plastic surgery operation  excess skin was removed around his trunk  . INCISION AND DRAINAGE OF WOUND N/A 10/07/2015   Procedure: DEBRIDEMENT OF BACK WOUND;  Surgeon: Peggye Form, DO;  Location: [facility];  Service: Plastics;  Laterality: N/A;  . JOINT REPLACEMENT Bilateral    knees  . 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 REATTACHED TENDON Left 05/2011   LEFT LEG  . TOTAL HIP ARTHROPLASTY Left 08/28/2017   Procedure: TOTAL HIP ARTHROPLASTY ANTERIOR APPROACH;  Surgeon: Jodi Geralds, MD;  Location: [facility];  Service: Orthopedics;  Laterality: Left;  . TOTAL HIP ARTHROPLASTY     right Dr. Luiz Blare 12-08-17  . TOTAL HIP ARTHROPLASTY Right 12/08/2017   Procedure: RIGHT TOTAL HIP ARTHROPLASTY ANTERIOR;  Surgeon: Jodi Geralds, MD;  Location: [facility];  Service: Orthopedics;  Laterality: Right;  . TOTAL KNEE ARTHROPLASTY Left 03/06/2015   Procedure: TOTAL KNEE ARTHROPLASTY;  Surgeon: Jodi Geralds, MD;  Location: [facility];  Service: Orthopedics;  Laterality: Left;  . TOTAL KNEE ARTHROPLASTY Right 06/22/2015   Procedure: RIGHT TOTAL KNEE ARTHROPLASTY;  Surgeon: Jodi Geralds, MD;  Location: [facility];  Service: Orthopedics;  Laterality: Right;   Social History   Tobacco Use  . Smoking status: Light Tobacco Smoker    Years: 26.00    Types: Cigars  . Smokeless tobacco: Never Used  Substance Use Topics  . Alcohol use: No    Alcohol/week: 0.0 oz    Frequency: Never   family history includes Diabetes in his mother.  Medications: Current Outpatient Medications  Medication Sig Dispense Refill  . aspirin EC 325 MG tablet Take 1 tablet (325 mg total) by mouth 2 (two) times daily after a meal. Take x 1 month post op to decrease risk of blood clots. 60 tablet 0  . BIOTIN PO Take 1 tablet by mouth daily.    . Cyanocobalamin (B-12 DOTS PO) Take 3 each by mouth every other day.    . docusate sodium (COLACE) 100 MG capsule Take 1 capsule (100 mg  total) by mouth 2 (two) times daily. 30 capsule 0  . ferrous sulfate 325 (65 FE) MG tablet Take 1 tablet (325 mg total) by mouth 3 (three) times daily with meals. 90 tablet 5  . Multiple Vitamins-Minerals (MULTIVITAMIN WITH MINERALS) tablet Take 1 tablet by mouth daily.    Marland Kitchen tiZANidine (ZANAFLEX) 2 MG tablet Take 1 tablet (2 mg total) by mouth every 8 (eight) hours as needed for muscle spasms. 50 tablet 0  . furosemide (LASIX) 40 MG tablet Take 1 tablet (40 mg total) by mouth daily. 30 tablet 3  . traMADol (ULTRAM) 50 MG tablet  No current facility-administered medications for this visit.    No Known Allergies  Health Maintenance Health Maintenance  Topic Date Due  . COLONOSCOPY  10/03/2016  . TETANUS/TDAP  07/08/2018 (Originally 10/03/1985)  . INFLUENZA VACCINE  08/23/2018 (Originally 05/03/2018)  . HIV Screening  Completed    Discussed warning signs or symptoms. Please see discharge instructions. Patient expresses understanding.

## 2018-03-01 NOTE — Patient Instructions (Signed)
Thank you for coming in today. Use compression stockings.  Get labs now.  Use Furosamide daily for swelling Recheck in 2-4 weeks.  Return sooner if needed.  Plans may change based on labs.     Edema Edema is when you have too much fluid in your body or under your skin. Edema may make your legs, feet, and ankles swell up. Swelling is also common in looser tissues, like around your eyes. This is a common condition. It gets more common as you get older. There are many possible causes of edema. Eating too much salt (sodium) and being on your feet or sitting for a long time can cause edema in your legs, feet, and ankles. Hot weather may make edema worse. Edema is usually painless. Your skin may look swollen or shiny. Follow these instructions at home:  Keep the swollen body part raised (elevated) above the level of your heart when you are sitting or lying down.  Do not sit still or stand for a long time.  Do not wear tight clothes. Do not wear garters on your upper legs.  Exercise your legs. This can help the swelling go down.  Wear elastic bandages or support stockings as told by your doctor.  Eat a low-salt (low-sodium) diet to reduce fluid as told by your doctor.  Depending on the cause of your swelling, you may need to limit how much fluid you drink (fluid restriction).  Take over-the-counter and prescription medicines only as told by your doctor. Contact a doctor if:  Treatment is not working.  You have heart, liver, or kidney disease and have symptoms of edema.  You have sudden and unexplained weight gain. Get help right away if:  You have shortness of breath or chest pain.  You cannot breathe when you lie down.  You have pain, redness, or warmth in the swollen areas.  You have heart, liver, or kidney disease and get edema all of a sudden.  You have a fever and your symptoms get worse all of a sudden. Summary  Edema is when you have too much fluid in your body or  under your skin.  Edema may make your legs, feet, and ankles swell up. Swelling is also common in looser tissues, like around your eyes.  Raise (elevate) the swollen body part above the level of your heart when you are sitting or lying down.  Follow your doctor's instructions about diet and how much fluid you can drink (fluid restriction). This information is not intended to replace advice given to you by your health care provider. Make sure you discuss any questions you have with your health care provider. Document Released: 03/07/2008 Document Revised: 10/07/2016 Document Reviewed: 10/07/2016 Elsevier Interactive Patient Education  2017 [employer].

## 2018-03-02 LAB — COMPLETE METABOLIC PANEL WITH GFR
AG Ratio: 1.4 (calc) (ref 1.0–2.5)
ALBUMIN MSPROF: 4 g/dL (ref 3.6–5.1)
ALT: 7 U/L — ABNORMAL LOW (ref 9–46)
AST: 18 U/L (ref 10–35)
Alkaline phosphatase (APISO): 76 U/L (ref 40–115)
BUN: 9 mg/dL (ref 7–25)
CALCIUM: 9.1 mg/dL (ref 8.6–10.3)
CO2: 27 mmol/L (ref 20–32)
Chloride: 106 mmol/L (ref 98–110)
Creat: 0.72 mg/dL (ref 0.70–1.33)
GFR, EST NON AFRICAN AMERICAN: 108 mL/min/{1.73_m2} (ref >=60)
GFR, Est African American: 125 mL/min/{1.73_m2} (ref >=60)
GLOBULIN: 2.9 g/dL (ref 1.9–3.7)
Glucose, Bld: 82 mg/dL (ref 65–99)
POTASSIUM: 4.5 mmol/L (ref 3.5–5.3)
SODIUM: 140 mmol/L (ref 135–146)
Total Bilirubin: 0.5 mg/dL (ref 0.2–1.2)
Total Protein: 6.9 g/dL (ref 6.1–8.1)

## 2018-03-02 LAB — VITAMIN B12: Vitamin B-12: 560 pg/mL (ref 200–1100)

## 2018-03-02 LAB — TSH: TSH: 1.08 m[IU]/L (ref 0.40–4.50)

## 2018-03-07 NOTE — Telephone Encounter (Signed)
Jordan Bailey called and asked about the results of the Iron and CBC. I called the lab and it was never ran. They are unable to add these on because it has been more than 5 days.

## 2018-03-08 DIAGNOSIS — M7989 Other specified soft tissue disorders: Secondary | ICD-10-CM | POA: Diagnosis not present

## 2018-03-08 DIAGNOSIS — Z9884 Bariatric surgery status: Secondary | ICD-10-CM | POA: Diagnosis not present

## 2018-03-08 DIAGNOSIS — D508 Other iron deficiency anemias: Secondary | ICD-10-CM | POA: Diagnosis not present

## 2018-03-08 NOTE — Telephone Encounter (Signed)
Order requisition re-printed. Pt can come and get labs again.

## 2018-03-08 NOTE — Telephone Encounter (Signed)
Patient advised to go to the lab.  

## 2018-03-09 LAB — IRON,TIBC AND FERRITIN PANEL
%SAT: 21 % (ref 15–60)
FERRITIN: 109 ng/mL (ref 20–380)
IRON: 57 ug/dL (ref 50–180)
TIBC: 270 ug/dL (ref 250–425)

## 2018-03-09 LAB — CBC
HEMATOCRIT: 35.4 % — AB (ref 38.5–50.0)
HEMOGLOBIN: 11.6 g/dL — AB (ref 13.2–17.1)
MCH: 24.6 pg — ABNORMAL LOW (ref 27.0–33.0)
MCHC: 32.8 g/dL (ref 32.0–36.0)
MCV: 75.2 fL — ABNORMAL LOW (ref 80.0–100.0)
MPV: 10.1 fL (ref 7.5–12.5)
Platelets: 221 10*3/uL (ref 140–400)
RBC: 4.71 10*6/uL (ref 4.20–5.80)
RDW: 15.2 % — ABNORMAL HIGH (ref 11.0–15.0)
WBC: 4.4 10*3/uL (ref 3.8–10.8)

## 2018-03-29 ENCOUNTER — Encounter: Payer: Self-pay | Admitting: Family Medicine

## 2018-03-29 ENCOUNTER — Ambulatory Visit (INDEPENDENT_AMBULATORY_CARE_PROVIDER_SITE_OTHER): Payer: Medicare Other | Admitting: Family Medicine

## 2018-03-29 VITALS — BP 125/81 | HR 53 | Temp 98.1°F | Ht 71.0 in | Wt 231.0 lb

## 2018-03-29 DIAGNOSIS — E538 Deficiency of other specified B group vitamins: Secondary | ICD-10-CM | POA: Diagnosis not present

## 2018-03-29 DIAGNOSIS — D508 Other iron deficiency anemias: Secondary | ICD-10-CM | POA: Diagnosis not present

## 2018-03-29 DIAGNOSIS — E6609 Other obesity due to excess calories: Secondary | ICD-10-CM

## 2018-03-29 DIAGNOSIS — Z9884 Bariatric surgery status: Secondary | ICD-10-CM

## 2018-03-29 DIAGNOSIS — Z6832 Body mass index (BMI) 32.0-32.9, adult: Secondary | ICD-10-CM | POA: Diagnosis not present

## 2018-03-29 DIAGNOSIS — M7989 Other specified soft tissue disorders: Secondary | ICD-10-CM | POA: Diagnosis not present

## 2018-03-29 MED ORDER — TETANUS-DIPHTH-ACELL PERTUSSIS 5-2-15.5 LF-MCG/0.5 IM SUSP: 0.5000 mL | Freq: Once | INTRAMUSCULAR | 0 refills | Status: AC

## 2018-03-29 NOTE — Progress Notes (Signed)
Jordan Bailey is a 52 y.o. male who [facility] today for follow-up leg swelling.  Jordan Bailey was seen a month ago for leg swelling.  His initial work-up was unremarkable with normal kidney function normal TSH.  He was prescribed Lasix.  He is use it once or twice and notes that it is pretty obnoxious as a cause excessive urination.  He notes off of the medication his leg swelling has significantly improved.  He is trying to eat a healthy diet low in salt.  He is status post bypass surgery and interested in resuming his gastric bypass diet.  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  His iron was rechecked at the last visit as well.  He notes he is increased his oral iron intake of ferrous sulfate to at least 3 times daily.  He tolerates it well without significant constipation.  Additionally has a history of B12 deficiency from his gastric bypass.  He continues to use his sublingual B12  He is due for Tdap vaccine today.   ROS as above:  Exam:  BP 125/81   Pulse (!) 53   Temp 98.1 F (36.7 C)   Ht 5\' 11"  (1.803 m)   Wt 231 lb (104.8 kg)   SpO2 100%   BMI 32.22 kg/m   Wt Readings from Last 5 Encounters:  03/29/18 231 lb (104.8 kg)  03/01/18 237 lb (107.5 kg)  01/02/18 225 lb (102.1 kg)  12/08/17 223 lb 15.8 oz (101.6 kg)  12/01/17 224 lb (101.6 kg)    Gen: Well NAD HEENT: EOMI,  MMM Lungs: Normal work of breathing. CTABL Heart: RRR no MRG Abd: NABS, Soft. Nondistended, Nontender Exts: Brisk capillary refill, warm and well perfused.  Trace nonpitting edema bilateral lower extremities.   Lab and Radiology Results   Chemistry      Component Value Date/Time   NA 140 03/01/2018 1138   K 4.5 03/01/2018 1138   CL 106 03/01/2018 1138   CO2 27 03/01/2018 1138   BUN 9 03/01/2018 1138   CREATININE 0.72 03/01/2018 1138      Component Value Date/Time   CALCIUM 9.1 03/01/2018 1138   ALKPHOS 71 12/01/2017 1058   AST 18 03/01/2018 1138   ALT 7 (L) 03/01/2018 1138   BILITOT 0.5 03/01/2018 1138     Lab Results  Component Value Date   WBC 4.4 03/08/2018   HGB 11.6 (L) 03/08/2018   HCT 35.4 (L) 03/08/2018   MCV 75.2 (L) 03/08/2018   PLT 221 03/08/2018   Lab Results  Component Value Date   IRON 57 03/08/2018   TIBC 270 03/08/2018   FERRITIN 109 03/08/2018   Lab Results  Component Value Date   VITAMINB12 560 03/01/2018       Assessment and Plan: [age] y.o. male with  Leg swelling: Significant improvement.  Likely pedal edema at this point.  Continue decrease salt diet and activity.  Recommend leg compression stockings as needed.  Use Lasix now only as needed.  Recheck in 6 months for Medicare wellness exam.  Iron deficiency anemia: Improving hemoglobin but not yet totally resolved.  Plan to continue oral iron at tolerated levels.  Recheck CBC in about 6 months.  B12: Doing well with supplementation.  Continue current dose as B12 labs are acceptable.  Tdap vaccine prescription sent to pharmacy for Medicare.  Counseled on importance of this vaccine and expresses understanding and agreement.  No orders of the defined types were placed in this encounter.  Meds ordered this encounter  Medications  . Tdap (ADACEL) 02-01-14.5 LF-MCG/0.5 injection    Sig: Inject 0.5 mLs into the muscle once for 1 dose.    Dispense:  0.5 mL    Refill:  0     Historical information moved to improve visibility of documentation.  Past Medical History:  Diagnosis Date  . Arthritis   . History of kidney stones   . Iron deficiency anemia    Past Surgical History:  Procedure Laterality Date  . APPLICATION OF A-CELL OF BACK N/A 10/07/2015   Procedure: APPLICATION OF ACELL AND VAC DRESSING TO BACK WOUND;  Surgeon: Peggye Form, DO;  Location: [facility];  Service: Plastics;  Laterality: N/A;  . COLONOSCOPY    . COSMETIC SURGERY     360 degree he had a 360 plastic surgery operation last week where excess skin was removed around his trunkhe had a 360 plastic surgery operation  excess skin was removed around his trunk  . INCISION AND DRAINAGE OF WOUND N/A 10/07/2015   Procedure: DEBRIDEMENT OF BACK WOUND;  Surgeon: Peggye Form, DO;  Location:  SURGERY CENTER;  Service: Plastics;  Laterality: N/A;  . JOINT REPLACEMENT Bilateral    knees  . 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 REATTACHED TENDON Left 05/2011   LEFT LEG  . TOTAL HIP ARTHROPLASTY Left 08/28/2017   Procedure: TOTAL HIP ARTHROPLASTY ANTERIOR APPROACH;  Surgeon: Jodi Geralds, MD;  Location: [facility];  Service: Orthopedics;  Laterality: Left;  . TOTAL HIP ARTHROPLASTY     right Dr. Luiz Blare 12-08-17  . TOTAL HIP ARTHROPLASTY Right 12/08/2017   Procedure: RIGHT TOTAL HIP ARTHROPLASTY ANTERIOR;  Surgeon: Jodi Geralds, MD;  Location: [facility];  Service: Orthopedics;  Laterality: Right;  . TOTAL KNEE ARTHROPLASTY Left 03/06/2015   Procedure: TOTAL KNEE ARTHROPLASTY;  Surgeon: Jodi Geralds, MD;  Location: [facility];  Service: Orthopedics;  Laterality: Left;  . TOTAL KNEE ARTHROPLASTY Right 06/22/2015   Procedure: RIGHT TOTAL KNEE ARTHROPLASTY;  Surgeon: Jodi Geralds, MD;  Location: [facility];  Service: Orthopedics;  Laterality: Right;   Social History   Tobacco Use  . Smoking status: Light Tobacco Smoker    Years: 26.00    Types: Cigars  . Smokeless tobacco: Never Used  Substance Use Topics  . Alcohol use: No    Alcohol/week: 0.0 oz    Frequency: Never   family history includes Diabetes in his mother.  Medications: Current Outpatient Medications  Medication Sig Dispense Refill  . BIOTIN PO Take 1 tablet by mouth daily.    . Cyanocobalamin (B-12 DOTS PO) Take 3 each by mouth every other day.    . docusate sodium (COLACE) 100 MG capsule Take 1 capsule  (100 mg total) by mouth 2 (two) times daily. 30 capsule 0  . ferrous sulfate 325 (65 FE) MG tablet Take 1 tablet (325 mg total) by mouth 3 (three) times daily with meals. 90 tablet 5  . furosemide (LASIX) 40 MG tablet Take 1 tablet (40 mg total) by mouth daily. 30 tablet 3  . Multiple Vitamins-Minerals (MULTIVITAMIN WITH MINERALS) tablet Take 1 tablet by mouth daily.    Marland Kitchen tiZANidine (ZANAFLEX) 2 MG tablet Take 1 tablet (2 mg total) by mouth every 8 (eight) hours as needed for muscle spasms. 50 tablet 0  . traMADol (ULTRAM)  50 MG tablet     . Tdap (ADACEL) 02-01-14.5 LF-MCG/0.5 injection Inject 0.5 mLs into the muscle once for 1 dose. 0.5 mL 0   No current facility-administered medications for this visit.    No Known Allergies   Discussed warning signs or symptoms. Please see discharge instructions. Patient expresses understanding.

## 2018-03-29 NOTE — Patient Instructions (Addendum)
Thank you for coming in today. Use lasix as needed.  Recheck with me for medicare physical in 6 months.   Get Tdap vaccine at pharmacy.

## 2018-04-30 DIAGNOSIS — S61211A Laceration without foreign body of left index finger without damage to nail, initial encounter: Secondary | ICD-10-CM | POA: Diagnosis not present

## 2018-04-30 DIAGNOSIS — G8911 Acute pain due to trauma: Secondary | ICD-10-CM | POA: Diagnosis not present

## 2018-04-30 DIAGNOSIS — S61311A Laceration without foreign body of left index finger with damage to nail, initial encounter: Secondary | ICD-10-CM | POA: Diagnosis not present

## 2018-04-30 DIAGNOSIS — S61201A Unspecified open wound of left index finger without damage to nail, initial encounter: Secondary | ICD-10-CM | POA: Diagnosis not present

## 2018-05-02 ENCOUNTER — Encounter: Payer: Self-pay | Admitting: Family Medicine

## 2018-05-02 ENCOUNTER — Ambulatory Visit (INDEPENDENT_AMBULATORY_CARE_PROVIDER_SITE_OTHER): Payer: Medicare Other | Admitting: Family Medicine

## 2018-05-02 VITALS — BP 124/79 | HR 69 | Ht 71.0 in | Wt 235.0 lb

## 2018-05-02 DIAGNOSIS — S61311A Laceration without foreign body of left index finger with damage to nail, initial encounter: Secondary | ICD-10-CM | POA: Diagnosis not present

## 2018-05-02 DIAGNOSIS — Z1211 Encounter for screening for malignant neoplasm of colon: Secondary | ICD-10-CM | POA: Diagnosis not present

## 2018-05-02 NOTE — Patient Instructions (Addendum)
Thank you for coming in today. Return for recheck in 3 weeks.  Keep the wound covered with ointment.  Transition to a minimal dressing when it stops bleeding.  Change dressing when dirty or daily.  Use a finger condom AKA finger cots.   Let me know if you worsen.

## 2018-05-02 NOTE — Progress Notes (Signed)
Jordan PeacockRoger Bailey is a 52 y.o. male who [facility] today for finger laceration.  Jordan Bailey suffered a fingertip laceration involving the fingernail of his left index finger.  This occurred on July 29.  He was seen in the emergency department where x-rays did not show fracture or foreign body.  Wound was cleaned and dressed with ointment and nonadherent dressing.  Additionally he was given a Tdap prior to discharge.  He notes the finger is sore and painful but not particularly bothersome.  He feels well otherwise no fevers or chills.   ROS as above:  Exam:  BP 124/79   Pulse 69   Ht 5\' 11"  (1.803 m)   Wt 235 lb (106.6 kg)   BMI 32.78 kg/m  Gen: Well NAD Left hand second digit laceration involving the dorsal aspect of the distal phalanx involving the radial half of the fingernail.  The laceration extends through the fingernail involves the nail plate.  No flap remains.  With dressing removal the wound bleeds.  Intact sensation distally.  Intact finger motion.  Lab and Radiology Results INDICATION: laceration index finger, eval for foreign body  COMPARISON: None  TECHNIQUE: XR FINGER LEFT MIN 2 VIEWS  FINDINGS: There is no evidence of fracture or dislocation. There is no soft tissue swelling or visible foreign body.   IMPRESSION:  1. No acute osseous abnormalities.   Electronically Signed by: Jordan FerrariJeanette Bailey    Assessment and Plan: 52 y.o. male with  Left index finger laceration involving the nail.  Doing quite well.  Discussed wound care strategies.  Plan for keeping the nail covered with ointment.  Provided nonadherent dressing and recommend transitioning to Vaseline with a Band-Aid and a finger condom in the near future.  Recheck in 2 to 3 weeks. Tdap up-to-date now after being given in the emergency department on the 29th.  Additionally health  maintenance reviewed.  Patient would like to proceed with Cologuard for colon cancer screening.   Orders Placed This Encounter  Procedures  . Cologuard   No orders of the defined types were placed in this encounter.    Historical information moved to improve visibility of documentation.  Past Medical History:  Diagnosis Date  . Arthritis   . History of kidney stones   . Iron deficiency anemia    Past Surgical History:  Procedure Laterality Date  . APPLICATION OF A-CELL OF BACK N/A 10/07/2015   Procedure: APPLICATION OF ACELL AND VAC DRESSING TO BACK WOUND;  Surgeon: Jordan Formlaire S Dillingham, DO;  Location: [facility];  Service: Plastics;  Laterality: N/A;  . COLONOSCOPY    . COSMETIC SURGERY     360 degree he had a 360 plastic surgery operation last week where excess skin was removed around his trunkhe had a 360 plastic surgery operation  excess skin was removed around his trunk  . INCISION AND DRAINAGE OF WOUND N/A 10/07/2015   Procedure: DEBRIDEMENT OF BACK WOUND;  Surgeon: Jordan Formlaire S Dillingham, DO;  Location: [facility];  Service: Plastics;  Laterality: N/A;  . JOINT REPLACEMENT Bilateral    knees  . 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 REATTACHED TENDON Left 05/2011   LEFT LEG  . 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 TOTAL HIP ARTHROPLASTY  right Dr. Luiz Bailey 12-08-17  . TOTAL HIP ARTHROPLASTY Right 12/08/2017   Procedure: RIGHT TOTAL HIP ARTHROPLASTY ANTERIOR;  Surgeon: Jordan Geralds, MD;  Location: [facility];  Service: Orthopedics;  Laterality: Right;  . TOTAL KNEE ARTHROPLASTY Left 03/06/2015   Procedure: TOTAL KNEE ARTHROPLASTY;  Surgeon: Jordan Geralds, MD;  Location: [facility];  Service: Orthopedics;  Laterality: Left;  . TOTAL KNEE ARTHROPLASTY Right 06/22/2015   Procedure: RIGHT TOTAL KNEE  ARTHROPLASTY;  Surgeon: Jordan Geralds, MD;  Location: [facility];  Service: Orthopedics;  Laterality: Right;   Social History   Tobacco Use  . Smoking status: Light Tobacco Smoker    Years: 26.00    Types: Cigars  . Smokeless tobacco: Never Used  Substance Use Topics  . Alcohol use: No    Alcohol/week: 0.0 oz    Frequency: Never   family history includes Diabetes in his mother.  Medications: Current Outpatient Medications  Medication Sig Dispense Refill  . BIOTIN PO Take 1 tablet by mouth daily.    . Cyanocobalamin (B-12 DOTS PO) Take 3 each by mouth every other day.    . docusate sodium (COLACE) 100 MG capsule Take 1 capsule (100 mg total) by mouth 2 (two) times daily. 30 capsule 0  . ferrous sulfate 325 (65 FE) MG tablet Take 1 tablet (325 mg total) by mouth 3 (three) times daily with meals. 90 tablet 5  . furosemide (LASIX) 40 MG tablet Take 1 tablet (40 mg total) by mouth daily. 30 tablet 3  . Multiple Vitamins-Minerals (MULTIVITAMIN WITH MINERALS) tablet Take 1 tablet by mouth daily.    Marland Kitchen tiZANidine (ZANAFLEX) 2 MG tablet Take 1 tablet (2 mg total) by mouth every 8 (eight) hours as needed for muscle spasms. 50 tablet 0  . traMADol (ULTRAM) 50 MG tablet      No current facility-administered medications for this visit.    No Known Allergies   Discussed warning signs or symptoms. Please see discharge instructions. Patient expresses understanding.

## 2018-05-09 DIAGNOSIS — Z1212 Encounter for screening for malignant neoplasm of rectum: Secondary | ICD-10-CM | POA: Diagnosis not present

## 2018-05-09 DIAGNOSIS — Z1211 Encounter for screening for malignant neoplasm of colon: Secondary | ICD-10-CM | POA: Diagnosis not present

## 2018-05-18 LAB — COLOGUARD: Cologuard: NEGATIVE

## 2018-05-23 ENCOUNTER — Ambulatory Visit: Payer: Self-pay | Admitting: Family Medicine

## 2018-05-24 DIAGNOSIS — Z96642 Presence of left artificial hip joint: Secondary | ICD-10-CM | POA: Diagnosis not present

## 2018-05-24 DIAGNOSIS — M25552 Pain in left hip: Secondary | ICD-10-CM | POA: Diagnosis not present

## 2018-06-07 ENCOUNTER — Encounter: Payer: Self-pay | Admitting: Family Medicine

## 2018-06-13 IMAGING — DX DG HIP (WITH OR WITHOUT PELVIS) 1V PORT*L*
2 series · 2 of 2 positions shown · non-contrast
Comparison: None.

CLINICAL DATA: 51 y/o  M; status post total left hip replacement.

EXAM:
DG HIP (WITH OR WITHOUT PELVIS) 1V PORT LEFT

[hip lat (1 of 2)]
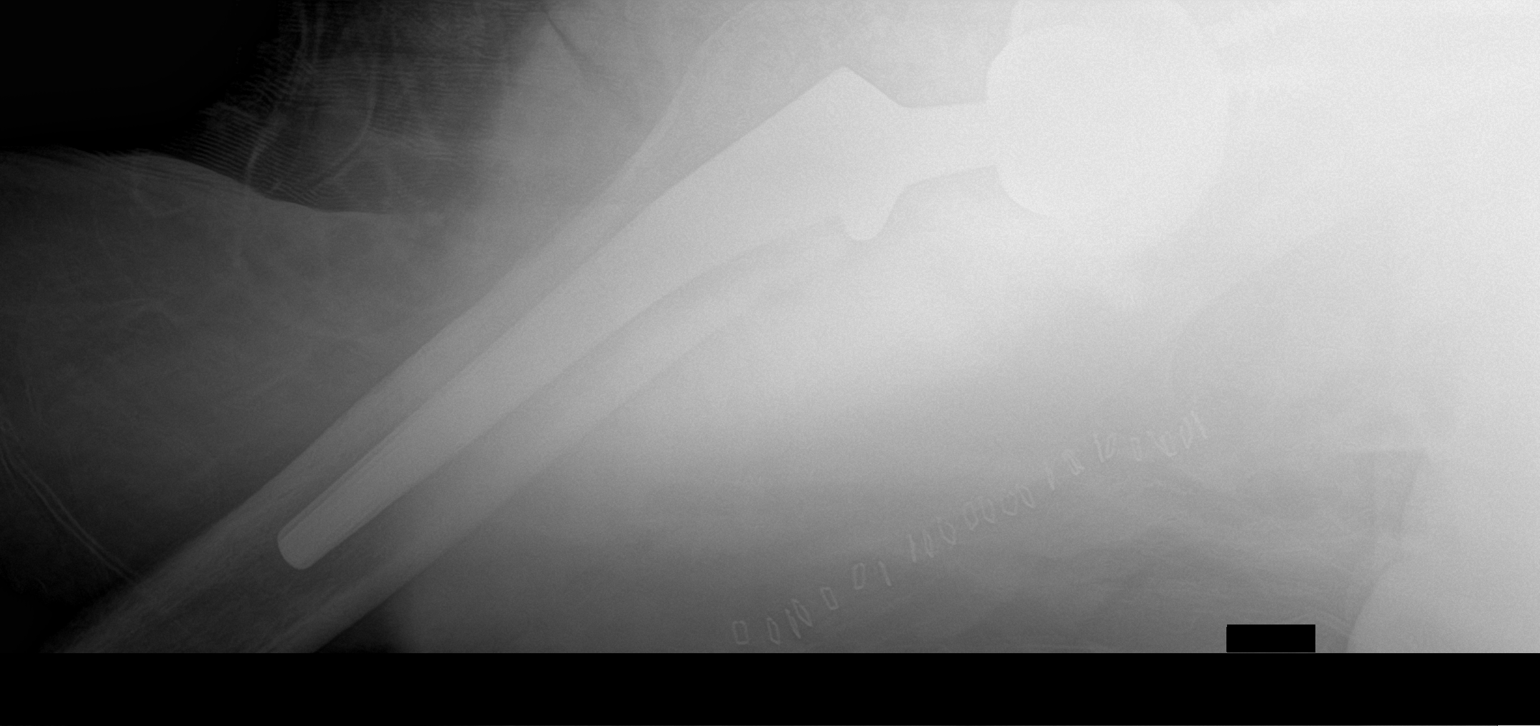

[hip lat (2 of 2)]
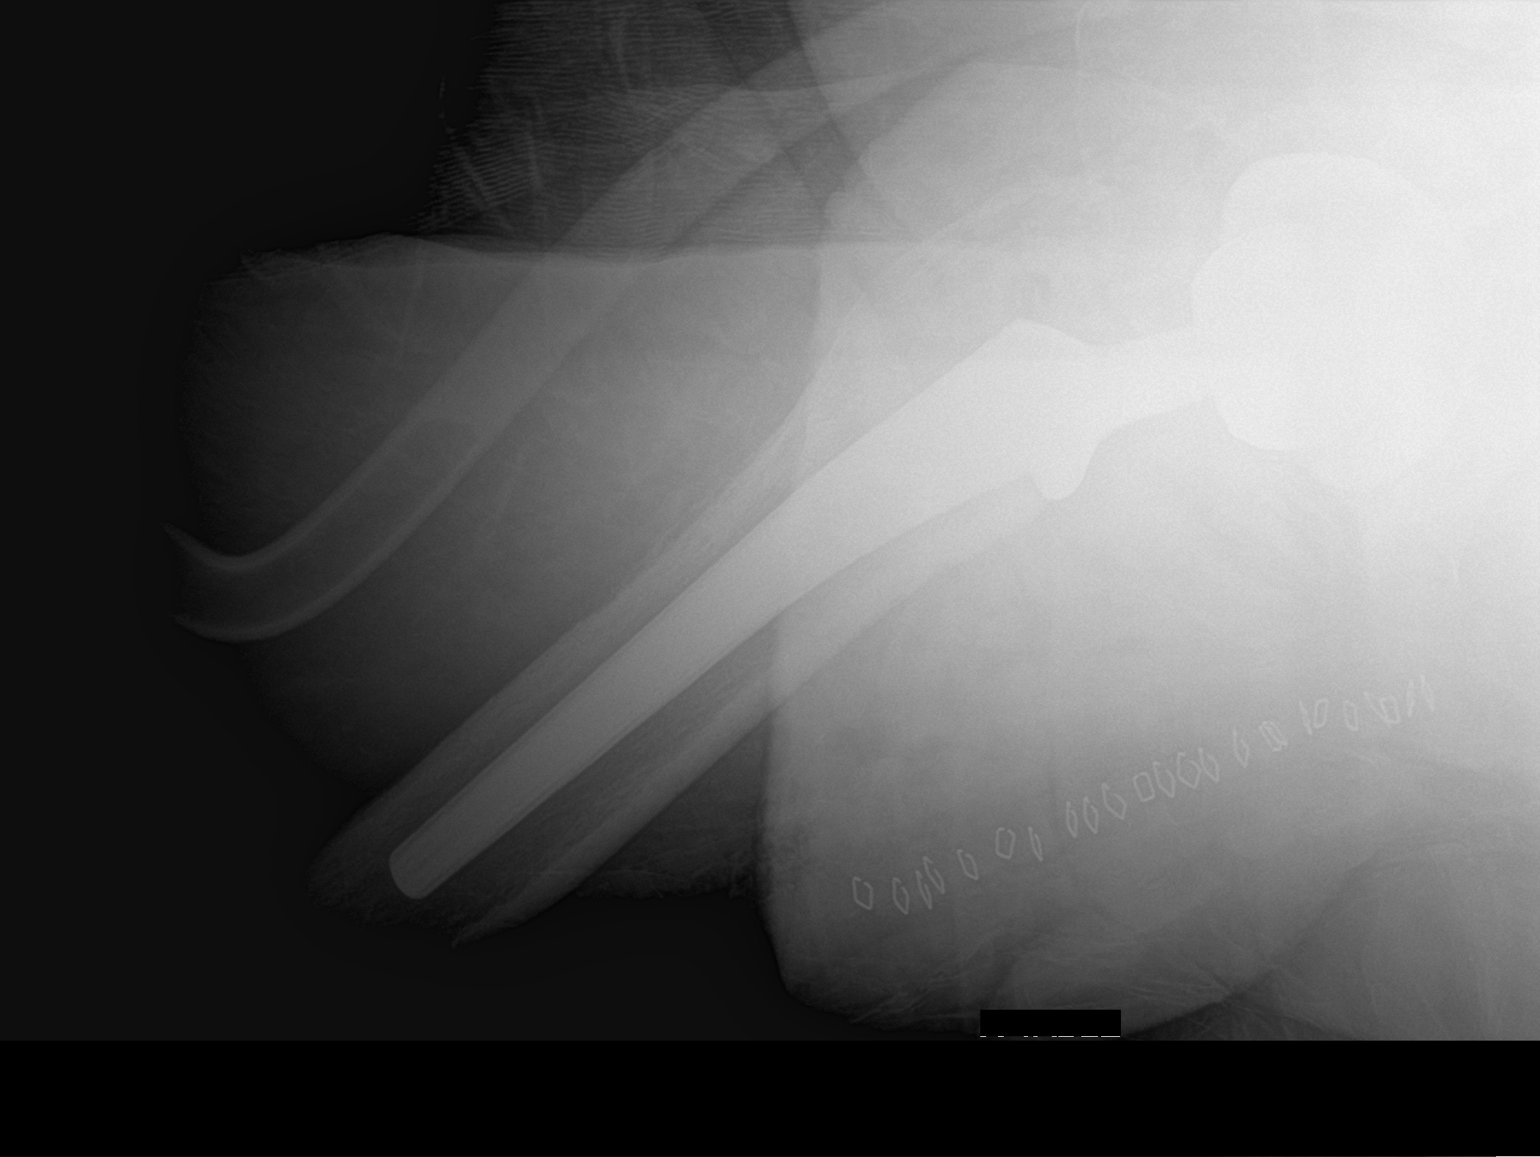

[2 of 2 positions shown; findings below may reference images not displayed]

FINDINGS: Left total hip prosthesis. No periprosthetic lucency or fracture
identified. Severe osteoarthrosis of the right hip joint with loss
of superior joint space and sclerotic/ fibrocystic degeneration of
articular surfaces. Postsurgical changes of left hip soft tissues
with edema and air throughout the flank and skin staples.
IMPRESSION: Total left hip prosthesis with soft tissue postsurgical changes.
Severe right hip osteoarthrosis.

By: Hima Idk M.D.

## 2018-07-20 NOTE — Progress Notes (Deleted)
Subjective:   Jordan Bailey is a 52 y.o. male who presents for an Initial Medicare Annual Wellness Visit.  Review of Systems  No ROS.  Medicare Wellness Visit. Additional risk factors are reflected in the social history.    Sleep patterns:    Home Safety/Smoke Alarms: Feels safe in home. Smoke alarms in place.  Living environment;       Male:   CCS-  utd    PSA- No results found for: PSA     Objective:    There were no vitals filed for this visit. There is no height or weight on file to calculate BMI.  Advanced Directives 12/08/2017 12/01/2017 08/29/2017 08/22/2017 10/07/2015 09/29/2015 06/23/2015  Does Patient Have a Medical Advance Directive? No No No No No No No  Would patient like information on creating a medical advance directive? No - Patient declined No - Patient declined No - Patient declined No - Patient declined No - patient declined information - No - patient declined information    Current Medications (verified) Outpatient Encounter Medications as of 07/31/2018  Medication Sig  . BIOTIN PO Take 1 tablet by mouth daily.  . Cyanocobalamin (B-12 DOTS PO) Take 3 each by mouth every other day.  . docusate sodium (COLACE) 100 MG capsule Take 1 capsule (100 mg total) by mouth 2 (two) times daily.  . ferrous sulfate 325 (65 FE) MG tablet Take 1 tablet (325 mg total) by mouth 3 (three) times daily with meals.  . furosemide (LASIX) 40 MG tablet Take 1 tablet (40 mg total) by mouth daily.  . Multiple Vitamins-Minerals (MULTIVITAMIN WITH MINERALS) tablet Take 1 tablet by mouth daily.  Marland Kitchen tiZANidine (ZANAFLEX) 2 MG tablet Take 1 tablet (2 mg total) by mouth every 8 (eight) hours as needed for muscle spasms.  . traMADol (ULTRAM) 50 MG tablet    No facility-administered encounter medications on file as of 07/31/2018.     Allergies (verified) Patient has no known allergies.   History: Past Medical History:  Diagnosis Date  . Arthritis   . History of kidney stones   .  Iron deficiency anemia    Past Surgical History:  Procedure Laterality Date  . APPLICATION OF A-CELL OF BACK N/A 10/07/2015   Procedure: APPLICATION OF ACELL AND VAC DRESSING TO BACK WOUND;  Surgeon: Peggye Form, DO;  Location: [facility];  Service: Plastics;  Laterality: N/A;  . COLONOSCOPY    . COSMETIC SURGERY     360 degree he had a 360 plastic surgery operation last week where excess skin was removed around his trunkhe had a 360 plastic surgery operation  excess skin was removed around his trunk  . INCISION AND DRAINAGE OF WOUND N/A 10/07/2015   Procedure: DEBRIDEMENT OF BACK WOUND;  Surgeon: Peggye Form, DO;  Location: [facility];  Service: Plastics;  Laterality: N/A;  . JOINT REPLACEMENT Bilateral    knees  . 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 REATTACHED TENDON Left 05/2011   LEFT LEG  . TOTAL HIP ARTHROPLASTY Left 08/28/2017   Procedure: TOTAL HIP ARTHROPLASTY ANTERIOR APPROACH;  Surgeon: Jodi Geralds, MD;  [facility];  Service: Orthopedics;  Laterality: Left;  . TOTAL HIP ARTHROPLASTY     right Dr. Luiz Blare 12-08-17  . TOTAL HIP ARTHROPLASTY Right 12/08/2017   Procedure: RIGHT TOTAL HIP ARTHROPLASTY ANTERIOR;  Surgeon: Jodi Geralds, MD;  Location: [facility];  Service: Orthopedics;  Laterality: Right;  . TOTAL KNEE ARTHROPLASTY Left  03/06/2015   Procedure: TOTAL KNEE ARTHROPLASTY;  Surgeon: Jodi Geralds, MD;  [facility];  Service: Orthopedics;  Laterality: Left;  . TOTAL KNEE ARTHROPLASTY Right 06/22/2015   Procedure: RIGHT TOTAL KNEE ARTHROPLASTY;  Surgeon: Jodi Geralds, MD;  [facility];  Service: Orthopedics;  Laterality: Right;   Family History  Problem Relation Age of Onset  . Diabetes Mother    Social History   Socioeconomic History  . Marital status: Legally Separated    Spouse name: Not on file  . Number of children: Not on file  . Years of education: Not on file  . Highest education level: Not on file   Occupational History  . Not on file  Social Needs  . Financial resource strain: Not on file  . Food insecurity:    Worry: Not on file    Inability: Not on file  . Transportation needs:    Medical: Not on file    Non-medical: Not on file  Tobacco Use  . Smoking status: Light Tobacco Smoker    Years: 26.00    Types: Cigars  . Smokeless tobacco: Never Used  Substance and Sexual Activity  . Alcohol use: No    Alcohol/week: 0.0 standard drinks    Frequency: Never  . Drug use: No  . Sexual activity: Yes    Partners: Female  Lifestyle  . Physical activity:    Days per week: Not on file    Minutes per session: Not on file  . Stress: Not on file  Relationships  . Social connections:    Talks on phone: Not on file    Gets together: Not on file    Attends religious service: Not on file    Active member of club or organization: Not on file    Attends meetings of clubs or organizations: Not on file    Relationship status: Not on file  Other Topics Concern  . Not on file  Social History Narrative  . Not on file   Tobacco Counseling Ready to quit: Not Answered Counseling given: Not Answered   Clinical Intake:                       Activities of Daily Living In your present state of health, do you have any difficulty performing the following activities: 12/08/2017 12/01/2017  Hearing? N N  Vision? N N  Comment - glasses  Difficulty concentrating or making decisions? N N  Walking or climbing stairs? Y Y  Dressing or bathing? N N  Doing errands, shopping? N N  Some recent data might be hidden     Immunizations and Health Maintenance Immunization History  Administered Date(s) Administered  . Tdap 04/30/2018   There are no preventive care reminders to display for this patient.  Patient Care Team: Rodolph Bong, MD as PCP - General (Family Medicine) Monica Becton, MD as Consulting Physician (Sports Medicine)  Indicate any recent Medical Services  you may have received from other than [facility] providers in the past year (date may be approximate).    Assessment:   This is a routine wellness examination for Jordan Bailey.Physical assessment deferred to PCP.   Hearing/Vision screen No exam data present  Dietary issues and exercise activities discussed:   Diet  Breakfast: Lunch:  Dinner:       Goals    . Quit Smoking      Depression Screen PHQ 2/9 Scores 03/29/2018 08/23/2017  PHQ - 2 Score 0 0  PHQ- 9 Score 0 -    Fall Risk Fall Risk  03/29/2018 08/23/2017  Falls in the past year? No No    Is the patient's home free of loose throw rugs in walkways, pet beds, electrical cords, etc?   {Blank single:19197::"yes","no"}      Grab bars in the bathroom? {Blank single:19197::"yes","no"}      Handrails on the stairs?   {Blank single:19197::"yes","no"}      Adequate lighting?   {Blank single:19197::"yes","no"}   Cognitive Function:        Screening Tests Health Maintenance  Topic Date Due  . INFLUENZA VACCINE  08/23/2018 (Originally 05/03/2018)  . Fecal DNA (Cologuard)  05/18/2021  . TETANUS/TDAP  04/30/2028  . HIV Screening  Completed       Plan:   ***  I have personally reviewed and noted the following in the patient's chart:   . Medical and social history . Use of alcohol, tobacco or illicit drugs  . Current medications and supplements . Functional ability and status . Nutritional status . Physical activity . Advanced directives . List of other physicians . Hospitalizations, surgeries, and ER visits in previous 12 months . Vitals . Screenings to include cognitive, depression, and falls . Referrals and appointments  In addition, I have reviewed and discussed with patient certain preventive protocols, quality metrics, and best practice recommendations. A written personalized care plan for preventive services as well as general preventive health recommendations were provided to patient.     Normand Sloop,  LPN   [id number]

## 2018-07-31 ENCOUNTER — Ambulatory Visit: Payer: Self-pay

## 2018-08-01 ENCOUNTER — Ambulatory Visit (INDEPENDENT_AMBULATORY_CARE_PROVIDER_SITE_OTHER): Payer: Medicare Other | Admitting: *Deleted

## 2018-08-01 VITALS — BP 125/80 | HR 62 | Temp 97.3°F | Ht 71.0 in | Wt 250.0 lb

## 2018-08-01 DIAGNOSIS — Z0000 Encounter for general adult medical examination without abnormal findings: Secondary | ICD-10-CM

## 2018-08-01 NOTE — Progress Notes (Signed)
Subjective:   Jordan Bailey is a 52 y.o. male who presents for an Initial Medicare Annual Wellness Visit.  Review of Systems  No ROS.  Medicare Wellness Visit. Additional risk factors are reflected in the social history.  Cardiac Risk Factors include: none Sleep patterns: Getting 7 hours of sleep at night. Feels rested upon wakeing,  Home Safety/Smoke Alarms: Feels safe in home. Smoke alarms in place.  Living environment; Lives in 1 story home with children. Seat Belt Safety/Bike Helmet: Wears seat belt.     Male:   CCS-  cologuard done 05/18/18- utd   PSA- No results found for: PSA     Objective:    Today's Vitals   08/01/18 0834  BP: 125/80  Pulse: 62  Temp: (!) 97.3 F (36.3 C)  SpO2: 100%  Weight: 250 lb (113.4 kg)  Height: 5\' 11"  (1.803 m)   Body mass index is 34.87 kg/m.  Advanced Directives 08/01/2018 12/08/2017 12/01/2017 08/29/2017 08/22/2017 10/07/2015 09/29/2015  Does Patient Have a Medical Advance Directive? No No No No No No No  Would patient like information on creating a medical advance directive? No - Patient declined No - Patient declined No - Patient declined No - Patient declined No - Patient declined No - patient declined information -    Current Medications (verified) Outpatient Encounter Medications as of 08/01/2018  Medication Sig  . Cyanocobalamin (B-12 DOTS PO) Take 3 each by mouth every other day.  . docusate sodium (COLACE) 100 MG capsule Take 1 capsule (100 mg total) by mouth 2 (two) times daily.  . ferrous sulfate 325 (65 FE) MG tablet Take 1 tablet (325 mg total) by mouth 3 (three) times daily with meals.  . furosemide (LASIX) 40 MG tablet Take 1 tablet (40 mg total) by mouth daily.  . Multiple Vitamins-Minerals (MULTIVITAMIN WITH MINERALS) tablet Take 1 tablet by mouth daily.  . traMADol (ULTRAM) 50 MG tablet   . BIOTIN PO Take 1 tablet by mouth daily.  Marland Kitchen tiZANidine (ZANAFLEX) 2 MG tablet Take 1 tablet (2 mg total) by mouth every 8 (eight)  hours as needed for muscle spasms. (Patient not taking: Reported on 08/01/2018)   No facility-administered encounter medications on file as of 08/01/2018.     Allergies (verified) Patient has no known allergies.   History: Past Medical History:  Diagnosis Date  . Arthritis   . History of kidney stones   . Iron deficiency anemia    Past Surgical History:  Procedure Laterality Date  . APPLICATION OF A-CELL OF BACK N/A 10/07/2015   Procedure: APPLICATION OF ACELL AND VAC DRESSING TO BACK WOUND;  Surgeon: Peggye Form, DO;  Location: [facility];  Service: Plastics;  Laterality: N/A;  . COLONOSCOPY    . COSMETIC SURGERY     360 degree he had a 360 plastic surgery operation last week where excess skin was removed around his trunkhe had a 360 plastic surgery operation  excess skin was removed around his trunk  . INCISION AND DRAINAGE OF WOUND N/A 10/07/2015   Procedure: DEBRIDEMENT OF BACK WOUND;  Surgeon: Peggye Form, DO;  Location: [facility];  Service: Plastics;  Laterality: N/A;  . JOINT REPLACEMENT Bilateral    knees  . 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 REATTACHED TENDON Left 05/2011   LEFT LEG  . TOTAL HIP ARTHROPLASTY Left 08/28/2017   Procedure: TOTAL HIP ARTHROPLASTY ANTERIOR APPROACH;  Surgeon: Jodi Geralds, MD;  [facility];  Service: Orthopedics;  Laterality: Left;  . TOTAL HIP ARTHROPLASTY     right Dr. Luiz Blare 12-08-17  . TOTAL HIP ARTHROPLASTY Right 12/08/2017   Procedure: RIGHT TOTAL HIP ARTHROPLASTY ANTERIOR;  Surgeon: Jodi Geralds, MD;  [facility];  Service: Orthopedics;  Laterality: Right;  . TOTAL KNEE ARTHROPLASTY Left 03/06/2015   Procedure: TOTAL KNEE ARTHROPLASTY;  Surgeon: Jodi Geralds, MD;  [facility];  Service: Orthopedics;  Laterality: Left;  . TOTAL KNEE ARTHROPLASTY Right 06/22/2015   Procedure: RIGHT TOTAL KNEE ARTHROPLASTY;  Surgeon: Jodi Geralds, MD;  [facility];  Service:  Orthopedics;  Laterality: Right;   Family History  Problem Relation Age of Onset  . Diabetes Mother    Social History   Socioeconomic History  . Marital status: Divorced    Spouse name: Not on file  . Number of children: 2  . Years of education: 23  . Highest education level: 12th grade  Occupational History  . Occupation: retired    Comment: truck Runner, broadcasting/film/video  . Financial resource strain: Not hard at all  . Food insecurity:    Worry: Never true    Inability: Never true  . Transportation needs:    Medical: No    Non-medical: No  Tobacco Use  . Smoking status: Light Tobacco Smoker    Years: 26.00    Types: Cigars  . Smokeless tobacco: Never Used  Substance and Sexual Activity  . Alcohol use: No    Alcohol/week: 0.0 standard drinks    Frequency: Never  . Drug use: No  . Sexual activity: Yes    Partners: Female  Lifestyle  . Physical activity:    Days per week: 1 day    Minutes per session: 30 min  . Stress: Not at all  Relationships  . Social connections:    Talks on phone: More than three times a week    Gets together: Twice a week    Attends religious service: Never    Active member of club or organization: Yes    Attends meetings of clubs or organizations: 1 to 4 times per year    Relationship status: Divorced  Other Topics Concern  . Not on file  Social History Narrative   No caffeine during the day. Walks with cane. 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   Tobacco Counseling Ready to quit: No Counseling given: Not Answered   Clinical Intake:  Pre-visit preparation completed: Yes  Pain : No/denies pain     Nutritional Risks: None Diabetes: No  How often do you need to have someone help you when you read instructions, pamphlets, or other written materials from your doctor or pharmacy?: 1 - Never What is the last grade level you completed in school?: 12  Interpreter Needed?: No  Information entered by :: Carolin Sicks, LPN  Activities  of Daily Living In your present state of health, do you have any difficulty performing the following activities: 08/01/2018 12/08/2017  Hearing? N N  Vision? N N  Comment - -  Difficulty concentrating or making decisions? N N  Walking or climbing stairs? N Y  Dressing or bathing? N N  Doing errands, shopping? N N  Preparing Food and eating ? N -  Using the Toilet? N -  In the past six months, have you accidently leaked urine? N -  Do you have problems with loss of bowel control? N -  Managing your Medications? N -  Managing your Finances? N -  Housekeeping or managing your Housekeeping? N -  Some recent data might be hidden     Immunizations and Health Maintenance Immunization History  Administered Date(s) Administered  . Tdap 04/30/2018   There are no preventive care reminders to display for this patient.  Patient Care Team: Rodolph Bong, MD as PCP - General (Family Medicine) Monica Becton, MD as Consulting Physician (Sports Medicine)  Indicate any recent Medical Services you may have received from other than [facility] providers in the past year (date may be approximate).    Assessment:   This is a routine wellness examination for Jordan Bailey.  Hearing/Vision screen  Visual Acuity Screening   Right eye Left eye Both eyes  Without correction: 20/20 20/20 20/20   With correction:     Hearing Screening Comments: Patient reported back all 3 words correctly.  Dietary issues and exercise activities discussed: Current Exercise Habits: Home exercise routine, Type of exercise: walking, Time (Minutes): 30, Frequency (Times/Week): 1, Weekly Exercise (Minutes/Week): 30, Intensity: Mild, Exercise limited by: orthopedic condition(s) Diet Eats protein. Had weight loss surgery . Breakfast:pt refused Lunch:pt refused Dinner:    Pt refused   Goals    . Quit Smoking      Depression Screen PHQ 2/9 Scores 08/01/2018 03/29/2018 08/23/2017  PHQ - 2 Score 0 0 0  PHQ- 9 Score - 0 -     Fall Risk Fall Risk  08/01/2018 03/29/2018 08/23/2017  Falls in the past year? No No No  Risk for fall due to : Impaired mobility - -    Is the patient's home free of loose throw rugs in walkways, pet beds, electrical cords, etc?   yes      Grab bars in the bathroom? no      Handrails on the stairs?   no      Adequate lighting?   no   Cognitive Function:     6CIT Screen 08/01/2018  What Year? 0 points  What month? 0 points  What time? 0 points  Count back from 20 0 points  Months in reverse 2 points  Repeat phrase 2 points  Total Score 4    Screening Tests Health Maintenance  Topic Date Due  . INFLUENZA VACCINE  08/23/2018 (Originally 05/03/2018)  . Fecal DNA (Cologuard)  05/18/2021  . TETANUS/TDAP  04/30/2028  . HIV Screening  Completed        Plan:  Please schedule your next medicare wellness visit with me in 1 yr.  Jordan Bailey , Thank you for taking time to come for your Medicare Wellness Visit. I appreciate your ongoing commitment to your health goals. Please review the following plan we discussed and let me know if I can assist you in the future.   These are the goals we discussed: Goals    . Quit Smoking       This is a list of the screening recommended for you and due dates:  Health Maintenance  Topic Date Due  . Flu Shot  08/23/2018*  . Cologuard (Stool DNA test)  05/18/2021  . Tetanus Vaccine  04/30/2028  . HIV Screening  Completed  *Topic was postponed. The date shown is not the original due date.     I have personally reviewed and noted the following in the patient's chart:   . Medical and social history . Use of alcohol, tobacco or illicit drugs  . Current medications and supplements . Functional ability and status . Nutritional status . Physical activity . Advanced directives .  List of other physicians . Hospitalizations, surgeries, and ER visits in previous 12 months . Vitals . Screenings to include cognitive, depression, and  falls . Referrals and appointments  In addition, I have reviewed and discussed with patient certain preventive protocols, quality metrics, and best practice recommendations. A written personalized care plan for preventive services as well as general preventive health recommendations were provided to patient.     Normand Sloop, LPN   [id number]

## 2018-08-01 NOTE — Patient Instructions (Signed)
Please schedule your next medicare wellness visit with me in 1 yr.  Jordan Bailey , Thank you for taking time to come for your Medicare Wellness Visit. I appreciate your ongoing commitment to your health goals. Please review the following plan we discussed and let me know if I can assist you in the future.

## 2018-08-08 ENCOUNTER — Telehealth: Payer: Self-pay

## 2018-08-08 DIAGNOSIS — Z3009 Encounter for other general counseling and advice on contraception: Secondary | ICD-10-CM

## 2018-08-08 NOTE — Telephone Encounter (Signed)
Patient called requested information about a vasectomy. Please advise if patient needs an appointment. Rhonda Cunningham,CMA

## 2018-08-08 NOTE — Telephone Encounter (Signed)
Left VM for Pt to return clinic call.  

## 2018-08-08 NOTE — Telephone Encounter (Signed)
Called patient and advised of that information. Jordan Bailey,CMA

## 2018-08-08 NOTE — Telephone Encounter (Signed)
I recommend [facility].  They do a very good job with a minimally invasive typically not very painful at all office-based procedure.  Last about 30 to 40 minutes and works really well.  I put a referral in and they will be happy to talk to you about it at length.  If you would like to have a longer conversation please schedule a follow-up appointment with me.

## 2018-09-23 IMAGING — DX DG PORTABLE PELVIS
1 series · 1 of 1 positions shown · non-contrast
Comparison: 08/28/2017

CLINICAL DATA: Right hip replacement

EXAM:
PORTABLE PELVIS 1-2 VIEWS

[pelvis ap]
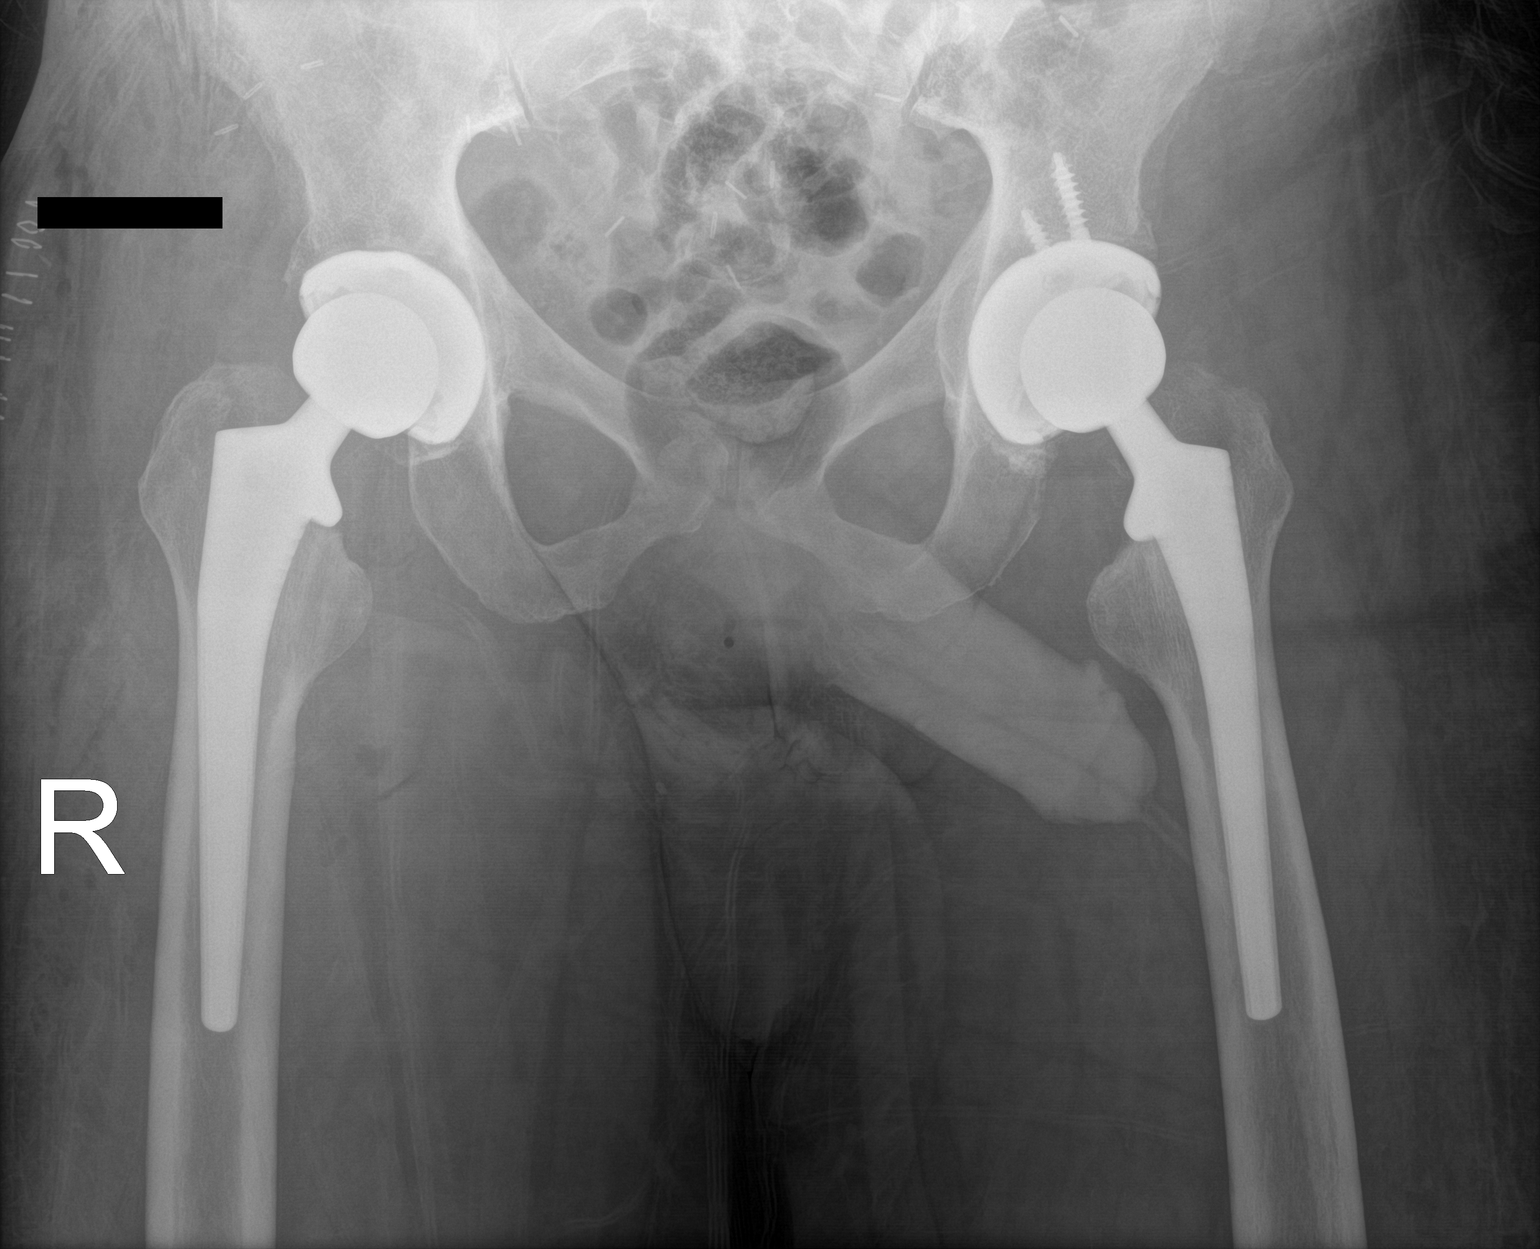

[1 of 1 positions shown; findings below may reference images not displayed]

FINDINGS: Right hip replacement with satisfactory alignment. No fracture or
dislocation

Chronic left hip replacement also appears satisfactory
IMPRESSION: Satisfactory right hip replacement.

## 2018-09-23 IMAGING — DX DG HIP (WITH OR WITHOUT PELVIS) 1V PORT*R*
1 series · 1 of 1 positions shown · non-contrast
Comparison: None.

CLINICAL DATA: Postop right hip

EXAM:
DG HIP (WITH OR WITHOUT PELVIS) 1V PORT RIGHT

[hip lat]
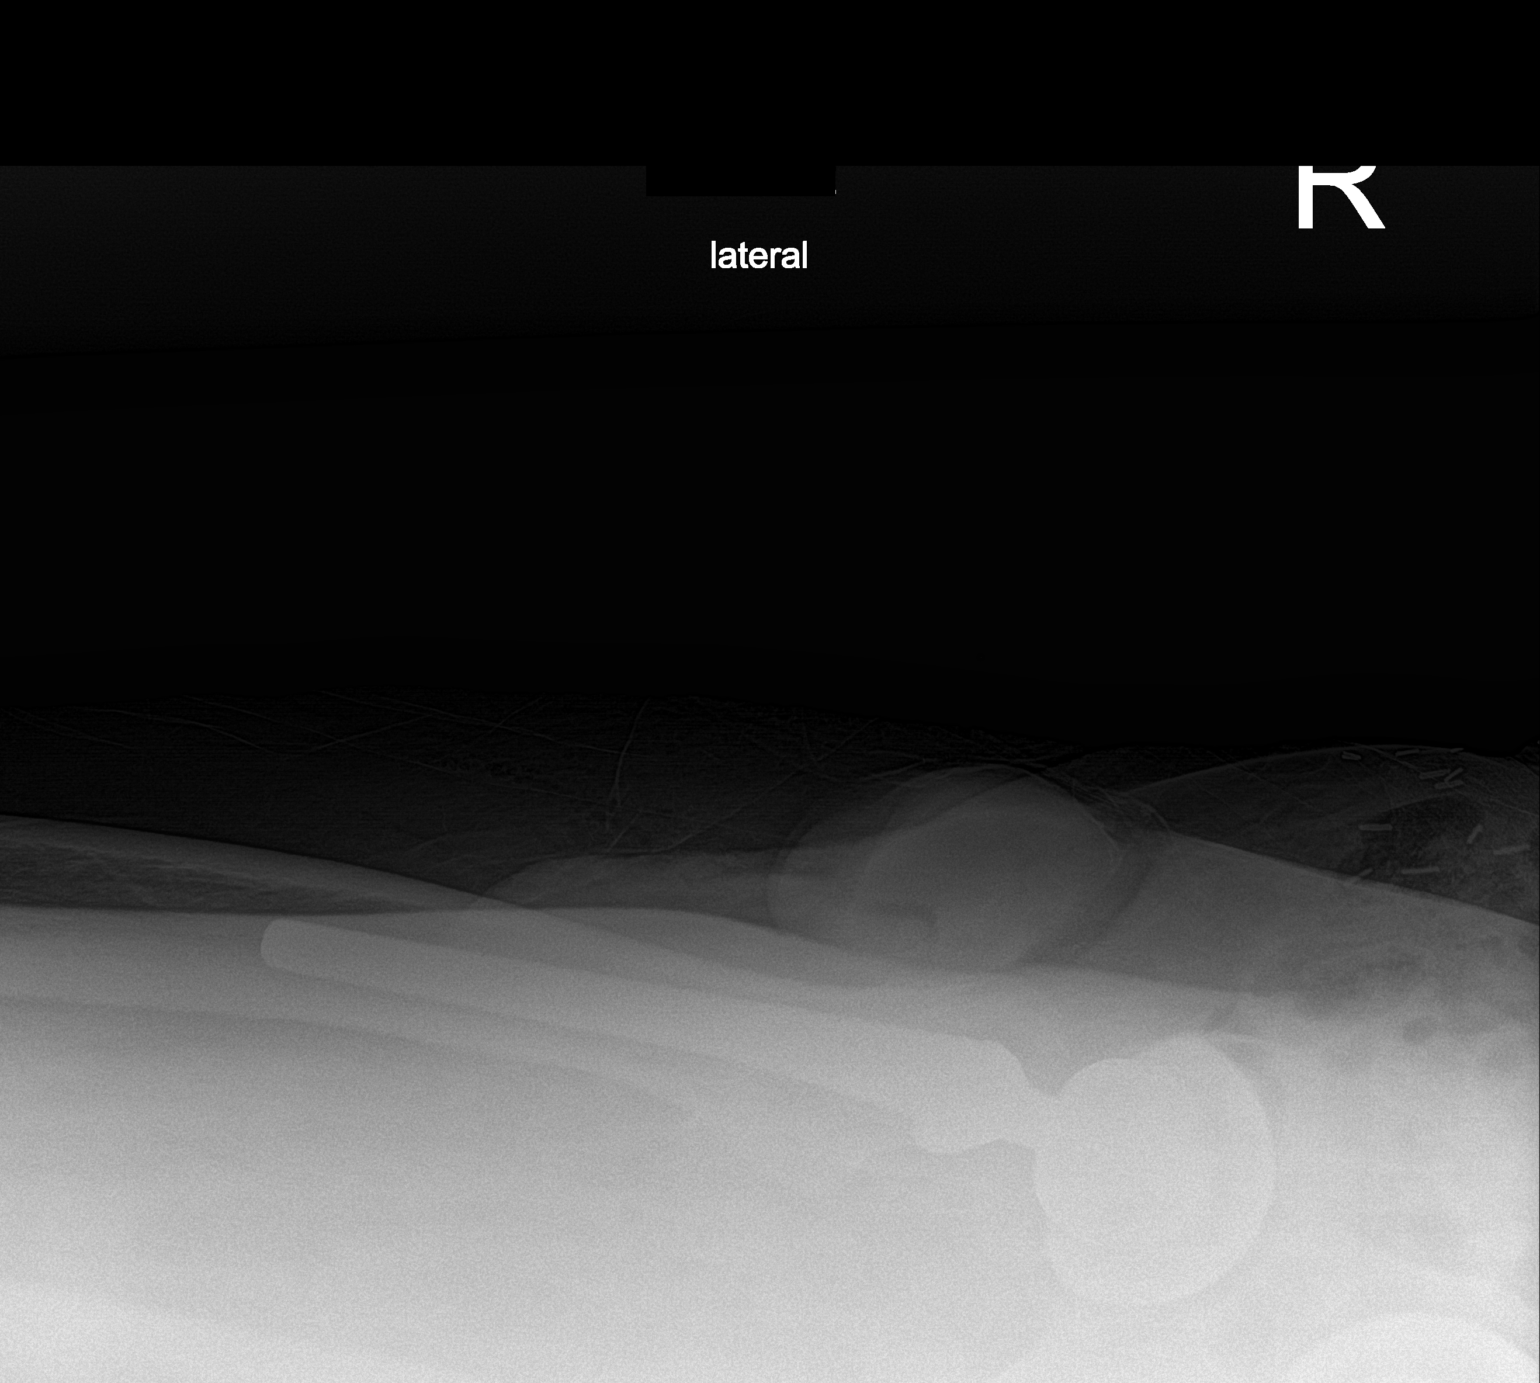

[1 of 1 positions shown; findings below may reference images not displayed]

FINDINGS: Right total hip arthroplasty without failure or complication.
Postsurgical changes in the surrounding soft tissues.
IMPRESSION: Interval right total hip arthroplasty.

## 2018-09-28 ENCOUNTER — Encounter: Payer: Self-pay | Admitting: Family Medicine

## 2019-07-29 ENCOUNTER — Telehealth: Payer: Self-pay | Admitting: Family Medicine

## 2019-07-29 NOTE — Telephone Encounter (Signed)
Generally in order to know what kind of specialist to send you to I will need to have a follow-up visit.  Please schedule appointment with myself or my partner Dr. Darene Lamer in the near future.

## 2019-07-29 NOTE — Telephone Encounter (Signed)
Pt called.  He wants to see a specialist about his feet turning in. Inserts not working. Thanks.

## 2019-07-29 NOTE — Telephone Encounter (Signed)
An appointment has been scheduled.  Thank you.

## 2019-07-30 ENCOUNTER — Encounter: Payer: Self-pay | Admitting: Family Medicine

## 2019-07-30 ENCOUNTER — Other Ambulatory Visit: Payer: Self-pay

## 2019-07-30 ENCOUNTER — Ambulatory Visit (INDEPENDENT_AMBULATORY_CARE_PROVIDER_SITE_OTHER): Payer: Medicare Other | Admitting: Family Medicine

## 2019-07-30 VITALS — BP 120/77 | HR 59 | Temp 97.9°F | Wt 278.0 lb

## 2019-07-30 DIAGNOSIS — E559 Vitamin D deficiency, unspecified: Secondary | ICD-10-CM | POA: Diagnosis not present

## 2019-07-30 DIAGNOSIS — D508 Other iron deficiency anemias: Secondary | ICD-10-CM

## 2019-07-30 DIAGNOSIS — S9301XD Subluxation of right ankle joint, subsequent encounter: Secondary | ICD-10-CM

## 2019-07-30 DIAGNOSIS — Z9884 Bariatric surgery status: Secondary | ICD-10-CM | POA: Diagnosis not present

## 2019-07-30 DIAGNOSIS — E538 Deficiency of other specified B group vitamins: Secondary | ICD-10-CM

## 2019-07-30 DIAGNOSIS — Z5181 Encounter for therapeutic drug level monitoring: Secondary | ICD-10-CM

## 2019-07-30 DIAGNOSIS — Z6838 Body mass index (BMI) 38.0-38.9, adult: Secondary | ICD-10-CM

## 2019-07-30 NOTE — Patient Instructions (Signed)
Thank you for coming in today.  You should hear form podiatry soon about your ankle.  Also reasonable to talk with foot and ankle orthopedic surgeon Dr Lucia Gaskins or Doran Durand in [place] [facility].   Keep me updated.   Get labs today.   Recheck as needed

## 2019-07-30 NOTE — Progress Notes (Signed)
Jordan Bailey is a 53 y.o. male who presents to [facility]: Primary Care Sports Medicine today for right ankle inversion.  Patient has a history of bilateral pes planus.  In the past he has been treated with orthotics.  Notes his left ankle is doing okay but the right is significantly rolling inwards.  He notes this is sometimes painful and interferes with gait.  He is interested in potential surgery or more definitive orthotics or other foot or ankle devices.  Additionally he has a history of iron deficiency anemia.  He has been taking about 4 over-the-counter iron pills per day.  He tolerates it with no issues.  He also has a history of onychomycosis of the left great toenail.  He has not treated it with anything yet.   ROS as above:  Exam:  BP 120/77   Pulse (!) 59   Temp 97.9 F (36.6 C) (Oral)   Wt 278 lb (126.1 kg)   BMI 38.77 kg/m  Wt Readings from Last 5 Encounters:  07/30/19 278 lb (126.1 kg)  08/01/18 250 lb (113.4 kg)  05/02/18 235 lb (106.6 kg)  03/29/18 231 lb (104.8 kg)  03/01/18 237 lb (107.5 kg)    Gen: Well NAD HEENT: EOMI,  MMM Lungs: Normal work of breathing. CTABL Heart: RRR no MRG Abd: NABS, Soft. Nondistended, Nontender Exts: Brisk capillary refill, warm and well perfused.  MSK: Significant right ankle medial subluxation with standing.  Significant pes planus as well. Left great toe thickened yellow toenail consistent with onychomycosis.    Assessment and Plan: 53 y.o. male with  Right ankle subluxation.  Failing my early conservative management.  Reasonable to refer podiatry.  He may benefit from other foot or ankle devices orthotics.  Additionally reasonable to discuss surgery.  Anemia: Plan to recheck CBC as well as iron stores.  Onychomycosis: Discussed treatment options.  Recommend terbinafine.  Patient would like to think about it.  We will go ahead and  check metabolic panel as well now.  Additionally patient is due for basic labs for bariatric surgery status including metabolic panel LDL B12 and vitamin D  PDMP not reviewed this encounter. Orders Placed This Encounter  Procedures  . CBC  . COMPLETE METABOLIC PANEL WITH GFR  . LDL cholesterol, direct  . Iron, TIBC and Ferritin Panel  . Vitamin B12  . VITAMIN D 25 Hydroxy (Vit-D Deficiency, Fractures)  . Ambulatory referral to Podiatry    Referral Priority:   Routine    Referral Type:   Consultation    Referral Reason:   Specialty Services Required    Requested Specialty:   Podiatry    Number of Visits Requested:   1   No orders of the defined types were placed in this encounter.    Historical information moved to improve visibility of documentation.  Past Medical History:  Diagnosis Date  . Arthritis   . History of kidney stones   . Iron deficiency anemia    Past Surgical History:  Procedure Laterality Date  . APPLICATION OF A-CELL OF BACK N/A 10/07/2015   Procedure: APPLICATION OF ACELL AND VAC DRESSING TO BACK WOUND;  Surgeon: Peggye Form, DO;  Location: [facility];  Service: Plastics;  Laterality: N/A;  . COLONOSCOPY    . COSMETIC SURGERY     360 degree he had a 360 plastic surgery operation last week where excess skin was removed around his trunkhe had a 360  plastic surgery operation  excess skin was removed around his trunk  . INCISION AND DRAINAGE OF WOUND N/A 10/07/2015   Procedure: DEBRIDEMENT OF BACK WOUND;  Surgeon: Wallace Going, DO;  Location: [facility];  Service: Plastics;  Laterality: N/A;  . JOINT REPLACEMENT Bilateral    knees  . 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 REATTACHED TENDON Left 05/2011   LEFT LEG  . TOTAL HIP ARTHROPLASTY Left 08/28/2017   Procedure: TOTAL HIP ARTHROPLASTY ANTERIOR APPROACH;  Surgeon: Dorna Leitz, MD;  Location: [facility];  Service: Orthopedics;  Laterality: Left;  . TOTAL  HIP ARTHROPLASTY     right Dr. Berenice Primas 12-08-17  . TOTAL HIP ARTHROPLASTY Right 12/08/2017   Procedure: RIGHT TOTAL HIP ARTHROPLASTY ANTERIOR;  Surgeon: Dorna Leitz, MD;  Location: [facility];  Service: Orthopedics;  Laterality: Right;  . TOTAL KNEE ARTHROPLASTY Left 03/06/2015   Procedure: TOTAL KNEE ARTHROPLASTY;  Surgeon: Dorna Leitz, MD;  Location: [place];  Service: Orthopedics;  Laterality: Left;  . TOTAL KNEE ARTHROPLASTY Right 06/22/2015   Procedure: RIGHT TOTAL KNEE ARTHROPLASTY;  Surgeon: Dorna Leitz, MD;  Location: [facility];  Service: Orthopedics;  Laterality: Right;   Social History   Tobacco Use  . Smoking status: Light Tobacco Smoker    Years: 26.00    Types: Cigars  . Smokeless tobacco: Never Used  Substance Use Topics  . Alcohol use: No    Alcohol/week: 0.0 standard drinks    Frequency: Never   family history includes Diabetes in his mother.  Medications: Current Outpatient Medications  Medication Sig Dispense Refill  . BIOTIN PO Take 1 tablet by mouth daily.    . Cyanocobalamin (B-12 DOTS PO) Take 3 each by mouth every other day.    . docusate sodium (COLACE) 100 MG capsule Take 1 capsule (100 mg total) by mouth 2 (two) times daily. 30 capsule 0  . ferrous sulfate 325 (65 FE) MG tablet Take 1 tablet (325 mg total) by mouth 3 (three) times daily with meals. 90 tablet 5  . Multiple Vitamins-Minerals (MULTIVITAMIN WITH MINERALS) tablet Take 1 tablet by mouth daily.    . traMADol (ULTRAM) 50 MG tablet      No current facility-administered medications for this visit.    No Known Allergies   Discussed warning signs or symptoms. Please see discharge instructions. Patient expresses understanding.

## 2019-07-31 ENCOUNTER — Telehealth: Payer: Self-pay | Admitting: Family Medicine

## 2019-07-31 LAB — CBC
HCT: 39.3 % (ref 38.5–50.0)
Hemoglobin: 12.9 g/dL — ABNORMAL LOW (ref 13.2–17.1)
MCH: 25.1 pg — ABNORMAL LOW (ref 27.0–33.0)
MCHC: 32.8 g/dL (ref 32.0–36.0)
MCV: 76.5 fL — ABNORMAL LOW (ref 80.0–100.0)
MPV: 10.5 fL (ref 7.5–12.5)
Platelets: 218 10*3/uL (ref 140–400)
RBC: 5.14 10*6/uL (ref 4.20–5.80)
RDW: 14.4 % (ref 11.0–15.0)
WBC: 4.6 10*3/uL (ref 3.8–10.8)

## 2019-07-31 LAB — COMPLETE METABOLIC PANEL WITH GFR
AG Ratio: 1.5 (calc) (ref 1.0–2.5)
ALT: 8 U/L — ABNORMAL LOW (ref 9–46)
AST: 20 U/L (ref 10–35)
Albumin: 4.1 g/dL (ref 3.6–5.1)
Alkaline phosphatase (APISO): 73 U/L (ref 35–144)
BUN: 12 mg/dL (ref 7–25)
CO2: 25 mmol/L (ref 20–32)
Calcium: 8.9 mg/dL (ref 8.6–10.3)
Chloride: 105 mmol/L (ref 98–110)
Creat: 0.85 mg/dL (ref 0.70–1.33)
GFR, Est African American: 116 mL/min/{1.73_m2} (ref >=60)
GFR, Est Non African American: 100 mL/min/{1.73_m2} (ref >=60)
Globulin: 2.7 g/dL (calc) (ref 1.9–3.7)
Glucose, Bld: 90 mg/dL (ref 65–99)
Potassium: 4.3 mmol/L (ref 3.5–5.3)
Sodium: 140 mmol/L (ref 135–146)
Total Bilirubin: 0.4 mg/dL (ref 0.2–1.2)
Total Protein: 6.8 g/dL (ref 6.1–8.1)

## 2019-07-31 LAB — IRON,TIBC AND FERRITIN PANEL
%SAT: 22 % (calc) (ref 20–48)
Ferritin: 152 ng/mL (ref 38–380)
Iron: 59 ug/dL (ref 50–180)
TIBC: 272 mcg/dL (calc) (ref 250–425)

## 2019-07-31 LAB — VITAMIN B12: Vitamin B-12: 641 pg/mL (ref 200–1100)

## 2019-07-31 LAB — VITAMIN D 25 HYDROXY (VIT D DEFICIENCY, FRACTURES): Vit D, 25-Hydroxy: 25 ng/mL — ABNORMAL LOW (ref 30–100)

## 2019-07-31 LAB — LDL CHOLESTEROL, DIRECT: Direct LDL: 80 mg/dL (ref <100)

## 2019-07-31 MED ORDER — TERBINAFINE HCL 250 MG PO TABS: 250.0000 mg | ORAL_TABLET | Freq: Every day | ORAL | 0 refills | Status: DC

## 2019-07-31 NOTE — Telephone Encounter (Signed)
Patient requested medication for toenail fungus.  Will prescribe terbinafine.  Patient will need lab recheck in 3 months.

## 2019-08-12 ENCOUNTER — Ambulatory Visit (INDEPENDENT_AMBULATORY_CARE_PROVIDER_SITE_OTHER): Payer: Medicare Other | Admitting: Podiatry

## 2019-08-12 ENCOUNTER — Encounter: Payer: Self-pay | Admitting: Podiatry

## 2019-08-12 ENCOUNTER — Other Ambulatory Visit: Payer: Self-pay | Admitting: Podiatry

## 2019-08-12 ENCOUNTER — Ambulatory Visit (INDEPENDENT_AMBULATORY_CARE_PROVIDER_SITE_OTHER): Payer: Medicare Other

## 2019-08-12 ENCOUNTER — Other Ambulatory Visit: Payer: Self-pay

## 2019-08-12 DIAGNOSIS — M2141 Flat foot [pes planus] (acquired), right foot: Secondary | ICD-10-CM | POA: Diagnosis not present

## 2019-08-12 DIAGNOSIS — M76821 Posterior tibial tendinitis, right leg: Secondary | ICD-10-CM

## 2019-08-12 DIAGNOSIS — M2140 Flat foot [pes planus] (acquired), unspecified foot: Secondary | ICD-10-CM

## 2019-08-12 DIAGNOSIS — M2142 Flat foot [pes planus] (acquired), left foot: Secondary | ICD-10-CM

## 2019-08-12 DIAGNOSIS — M25571 Pain in right ankle and joints of right foot: Secondary | ICD-10-CM

## 2019-08-16 ENCOUNTER — Other Ambulatory Visit: Payer: Medicare Other | Admitting: Orthotics

## 2019-08-16 ENCOUNTER — Other Ambulatory Visit: Payer: Self-pay

## 2019-08-19 NOTE — Progress Notes (Signed)
Subjective:   Patient ID: Jordan Bailey, male   DOB: 53 y.o.   MRN: [id number]   HPI Patient presents with severe breakdown of the arch right stating that this is been going on for a long time but the left also moderately deformed.  States that he does not have enough support and states he tries to walk as best he can and does not currently smoke and would like to be more active   Review of Systems  All other systems reviewed and are negative.       Objective:  Physical Exam Vitals signs and nursing note reviewed.  Constitutional:      Appearance: He is well-developed.  Pulmonary:     Effort: Pulmonary effort is normal.  Musculoskeletal: Normal range of motion.  Skin:    General: Skin is warm.  Neurological:     Mental Status: He is alert.     Neurovascular status was found to be intact with muscle strength moderately diminished on the right side with excessive flatfoot deformity and posterior tibial tendon dysfunction.  Patient is found to have good digital perfusion well oriented x3 and does not walk with a normal heel toe gait pattern.     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

## 2019-09-24 ENCOUNTER — Other Ambulatory Visit: Payer: Self-pay

## 2019-09-24 ENCOUNTER — Ambulatory Visit (INDEPENDENT_AMBULATORY_CARE_PROVIDER_SITE_OTHER): Payer: Medicare Other | Admitting: Orthotics

## 2019-09-24 DIAGNOSIS — M25571 Pain in right ankle and joints of right foot: Secondary | ICD-10-CM

## 2019-09-24 DIAGNOSIS — M76821 Posterior tibial tendinitis, right leg: Secondary | ICD-10-CM

## 2019-09-24 DIAGNOSIS — M2140 Flat foot [pes planus] (acquired), unspecified foot: Secondary | ICD-10-CM

## 2019-09-24 DIAGNOSIS — S9301XA Subluxation of right ankle joint, initial encounter: Secondary | ICD-10-CM

## 2019-09-24 NOTE — Progress Notes (Signed)
Patient came in today to pick up [place] brace.  Patient was evaluated for fit and function.   The brace fit very well and there were any complaints of the way it felt once donned.  The brace offered ankle stability in both saggital and coroneal planes.  Patient advised to always wear proper fitting shoes with brace.

## 2019-10-08 ENCOUNTER — Ambulatory Visit: Payer: Medicare Other | Admitting: Orthotics

## 2019-10-08 ENCOUNTER — Other Ambulatory Visit: Payer: Self-pay

## 2019-10-08 DIAGNOSIS — M2140 Flat foot [pes planus] (acquired), unspecified foot: Secondary | ICD-10-CM

## 2019-10-08 DIAGNOSIS — M25571 Pain in right ankle and joints of right foot: Secondary | ICD-10-CM

## 2019-10-08 DIAGNOSIS — M76821 Posterior tibial tendinitis, right leg: Secondary | ICD-10-CM

## 2019-10-08 NOTE — Progress Notes (Signed)
Further offloaded navicular by excavating maerial and adding more poron padding; also added about 2* varus RF posting

## 2019-12-02 ENCOUNTER — Other Ambulatory Visit: Payer: Self-pay | Admitting: Family Medicine

## 2019-12-06 ENCOUNTER — Other Ambulatory Visit: Payer: Medicare Other | Admitting: Orthotics

## 2019-12-06 ENCOUNTER — Other Ambulatory Visit: Payer: Self-pay

## 2019-12-25 ENCOUNTER — Other Ambulatory Visit: Payer: Medicare Other | Admitting: Orthotics

## 2019-12-30 ENCOUNTER — Other Ambulatory Visit: Payer: Medicare Other | Admitting: Orthotics

## 2020-02-01 ENCOUNTER — Other Ambulatory Visit: Payer: Self-pay | Admitting: Family Medicine

## 2020-05-06 ENCOUNTER — Encounter: Payer: Self-pay | Admitting: Family Medicine

## 2020-05-06 ENCOUNTER — Other Ambulatory Visit: Payer: Self-pay

## 2020-05-06 ENCOUNTER — Ambulatory Visit (INDEPENDENT_AMBULATORY_CARE_PROVIDER_SITE_OTHER): Payer: Medicare Other | Admitting: Family Medicine

## 2020-05-06 VITALS — BP 122/77 | HR 59 | Ht 70.87 in | Wt 272.0 lb

## 2020-05-06 DIAGNOSIS — Z113 Encounter for screening for infections with a predominantly sexual mode of transmission: Secondary | ICD-10-CM | POA: Diagnosis not present

## 2020-05-06 NOTE — Assessment & Plan Note (Signed)
Orders Placed This Encounter  Procedures   Chlamydia/Neisseria Gonorrhoeae RNA,TMA,Urogenital   HIV antibody (with reflex)   RPR  Screening test ordered.  Discussed that HSV not ordered and explained why antibody testing is not typically ordered to screen for this.

## 2020-05-06 NOTE — Progress Notes (Signed)
Jordan Bailey - 53 y.o. male MRN [id number]  Date of birth: 1966/05/13  Subjective Chief Complaint  Patient presents with  . Labs Only    HPI Jordan Bailey is a 54 y.o. male with history of bariatric surgery here today to request updated STD screening.  He has a new partner and wants to be sure everything is negative.  He denies symptoms including dysuria, fevers, penile discharge, ulceration or testicular pain.    ROS:  A comprehensive ROS was completed and negative except as noted per HPI  No Known Allergies  Past Medical History:  Diagnosis Date  . Arthritis   . History of kidney stones   . Iron deficiency anemia     Past Surgical History:  Procedure Laterality Date  . APPLICATION OF A-CELL OF BACK N/A 10/07/2015   Procedure: APPLICATION OF ACELL AND VAC DRESSING TO BACK WOUND;  Surgeon: Peggye Form, DO;  Location: [facility];  Service: Plastics;  Laterality: N/A;  . COLONOSCOPY    . COSMETIC SURGERY     360 degree he had a 360 plastic surgery operation last week where excess skin was removed around his trunkhe had a 360 plastic surgery operation  excess skin was removed around his trunk  . INCISION AND DRAINAGE OF WOUND N/A 10/07/2015   Procedure: DEBRIDEMENT OF BACK WOUND;  Surgeon: Peggye Form, DO;  Location: [facility];  Service: Plastics;  Laterality: N/A;  . JOINT REPLACEMENT Bilateral    knees  . 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 REATTACHED TENDON Left 05/2011   LEFT LEG  . TOTAL HIP ARTHROPLASTY Left 08/28/2017   Procedure: TOTAL HIP ARTHROPLASTY ANTERIOR APPROACH;  Surgeon: Jodi Geralds, MD;  Location[facility];  Service: Orthopedics;  Laterality: Left;  . TOTAL HIP ARTHROPLASTY     right Dr. Luiz Blare 12-08-17  . TOTAL HIP ARTHROPLASTY Right 12/08/2017   Procedure: RIGHT TOTAL HIP ARTHROPLASTY ANTERIOR;  Surgeon: Jodi Geralds, MD;  Location: [facility];  Service: Orthopedics;  Laterality: Right;  . TOTAL KNEE  ARTHROPLASTY Left 03/06/2015   Procedure: TOTAL KNEE ARTHROPLASTY;  Surgeon: Jodi Geralds, MD;  Location: [facility];  Service: Orthopedics;  Laterality: Left;  . TOTAL KNEE ARTHROPLASTY Right 06/22/2015   Procedure: RIGHT TOTAL KNEE ARTHROPLASTY;  Surgeon: Jodi Geralds, MD;  Location: [facility];  Service: Orthopedics;  Laterality: Right;    Social History   Socioeconomic History  . Marital status: Divorced    Spouse name: Not on file  . Number of children: 2  . Years of education: 33  . Highest education level: 12th grade  Occupational History  . Occupation: retired    Comment: truck Hospital doctor  Tobacco Use  . Smoking status: Light Tobacco Smoker    Years: 26.00    Types: Cigars  . Smokeless tobacco: Never Used  Vaping Use  . Vaping Use: Never used  Substance and Sexual Activity  . Alcohol use: No    Alcohol/week: 0.0 standard drinks  . Drug use: No  . Sexual activity: Yes    Partners: Female  Other Topics Concern  . Not on file  Social History Narrative   No caffeine during the day. Walks with cane. 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 Difficulty of Paying Living Expenses:   Food Insecurity:   . Worried About Programme researcher, broadcasting/film/video in the Last Year:   . [employer]  in the Last Year:   Transportation Needs:   . Freight forwarder (Medical):   Marland Kitchen Lack of Transportation (Non-Medical):   Physical Activity:   . Days of Exercise per Week:   . Minutes of Exercise per Session:   Stress:   . Feeling of Stress :   Social Connections:   . Frequency of Communication with Friends and Family:   . Frequency of Social Gatherings with Friends and Family:   . Attends Religious Services:   . Active Member of Clubs or Organizations:   . Attends Banker Meetings:   Marland Kitchen Marital Status:     Family History  Problem Relation Age of Onset  . Diabetes Mother     Health Maintenance  Topic Date Due  . Hepatitis C  Screening  Never done  . COVID-19 Vaccine (1) Never done  . INFLUENZA VACCINE  05/03/2020  . Fecal DNA (Cologuard)  05/18/2021  . TETANUS/TDAP  04/30/2028  . HIV Screening  Completed     ----------------------------------------------------------------------------------------------------------------------------------------------------------------------------------------------------------------- Physical Exam BP 122/77 (BP Location: Right Arm, Patient Position: Sitting, Cuff Size: Large)   Pulse (!) 59   Ht 5' 10.87" (1.8 m)   Wt 272 lb (123.4 kg)   SpO2 99%   BMI 38.08 kg/m   Physical Exam Constitutional:      Appearance: Normal appearance.  HENT:     Head: Normocephalic and atraumatic.  Eyes:     General: No scleral icterus. Cardiovascular:     Rate and Rhythm: Normal rate and regular rhythm.  Pulmonary:     Effort: Pulmonary effort is normal.     Breath sounds: Normal breath sounds.  Neurological:     General: No focal deficit present.     Mental Status: He is alert.     ------------------------------------------------------------------------------------------------------------------------------------------------------------------------------------------------------------------- Assessment and Plan  Screening for STD (sexually transmitted disease) Orders Placed This Encounter  Procedures  . Chlamydia/Neisseria Gonorrhoeae RNA,TMA,Urogenital  . HIV antibody (with reflex)  . RPR  Screening test ordered.  Discussed that HSV not ordered and explained why antibody testing is not typically ordered to screen for this.    No orders of the defined types were placed in this encounter.   No follow-ups on file.    This visit occurred during the SARS-CoV-2 public health emergency.  Safety protocols were in place, including screening questions prior to the visit, additional usage of staff PPE, and extensive cleaning of exam room while observing appropriate contact time as  indicated for disinfecting solutions.

## 2020-05-06 NOTE — Patient Instructions (Signed)
Nice to meet you today! Stop by the lab  We'll be in touch with results.

## 2020-05-08 ENCOUNTER — Encounter: Payer: Self-pay | Admitting: Family Medicine

## 2020-05-08 NOTE — Telephone Encounter (Signed)
Can you please review his results and release to [employer]

## 2020-05-10 LAB — CHLAMYDIA/NEISSERIA GONORRHOEAE RNA,TMA,UROGENTIAL
C. trachomatis RNA, TMA: NOT DETECTED
N. gonorrhoeae RNA, TMA: NOT DETECTED

## 2020-05-10 LAB — RPR: RPR Ser Ql: NONREACTIVE

## 2020-05-10 LAB — HIV ANTIBODY (ROUTINE TESTING W REFLEX): HIV 1&2 Ab, 4th Generation: NONREACTIVE

## 2020-12-09 ENCOUNTER — Telehealth: Payer: Medicare Other

## 2020-12-09 ENCOUNTER — Ambulatory Visit (INDEPENDENT_AMBULATORY_CARE_PROVIDER_SITE_OTHER): Payer: Medicare Other | Admitting: Family Medicine

## 2020-12-09 ENCOUNTER — Other Ambulatory Visit: Payer: Self-pay

## 2020-12-09 DIAGNOSIS — Z1211 Encounter for screening for malignant neoplasm of colon: Secondary | ICD-10-CM

## 2020-12-09 DIAGNOSIS — Z0000 Encounter for general adult medical examination without abnormal findings: Secondary | ICD-10-CM | POA: Diagnosis not present

## 2020-12-09 NOTE — Progress Notes (Signed)
MEDICARE ANNUAL WELLNESS VISIT  12/09/2020  Telephone Visit Disclaimer This Medicare AWV was conducted by telephone due to national recommendations for restrictions regarding the COVID-19 Pandemic (e.g. social distancing).  I verified, using two identifiers, that I am speaking with Jordan Bailey or their authorized healthcare agent. I discussed the limitations, risks, security, and privacy concerns of performing an evaluation and management service by telephone and the potential availability of an in-person appointment in the future. The patient expressed understanding and agreed to proceed.  Location of Patient: Home Location of Provider (nurse):  In the office.  Subjective:    Jordan Bailey is a 55 y.o. male patient of Everrett Coombe, DO who had a Medicare Annual Wellness Visit today via telephone. Christin is Disabled and lives with their family. he has 2 children. he reports that he is socially active and does interact with friends/family regularly. he is minimally physically active and enjoys working out when he can and spending time with his family.  Patient Care Team: Everrett Coombe, DO as PCP - General (Family Medicine) Monica Becton, MD as Consulting Physician (Sports Medicine)  Advanced Directives 12/09/2020 08/01/2018 12/08/2017 12/01/2017 08/29/2017 08/22/2017 10/07/2015  Does Patient Have a Medical Advance Directive? No No No No No No No  Would patient like information on creating a medical advance directive? No - Patient declined No - Patient declined No - Patient declined No - Patient declined No - Patient declined No - Patient declined No - patient declined information    Hospital Utilization Over the Past 12 Months: # of hospitalizations or ER visits: 0 # of surgeries: 0  Review of Systems    Patient reports that his overall health is unchanged compared to last year.  History obtained from chart review and the patient  Patient Reported Readings (BP, Pulse, CBG, Weight,  etc) none  Pain Assessment Pain : No/denies pain     Current Medications & Allergies (verified) Allergies as of 12/09/2020   No Known Allergies     Medication List       Accurate as of December 09, 2020 10:38 AM. If you have any questions, ask your nurse or doctor.        B-12 DOTS PO Take 3 each by mouth every other day.   BIOTIN PO Take 1 tablet by mouth daily.   docusate sodium 100 MG capsule Commonly known as: Colace Take 1 capsule (100 mg total) by mouth 2 (two) times daily.   ferrous sulfate 325 (65 FE) MG tablet Take 1 tablet (325 mg total) by mouth 3 (three) times daily with meals.   multivitamin with minerals tablet Take 1 tablet by mouth daily.   terbinafine 250 MG tablet Commonly known as: LAMISIL TAKE 1 TABLET BY MOUTH DAILY. FOR TOENAIL FUNGUS. WILL NEED LIVER LAB CHECK AFTER 3 MONTHS   traMADol 50 MG tablet Commonly known as: ULTRAM       History (reviewed): Past Medical History:  Diagnosis Date  . Arthritis   . History of kidney stones   . Iron deficiency anemia    Past Surgical History:  Procedure Laterality Date  . APPLICATION OF A-CELL OF BACK N/A 10/07/2015   Procedure: APPLICATION OF ACELL AND VAC DRESSING TO BACK WOUND;  Surgeon: Peggye Form, DO;  Location: [facility];  Service: Plastics;  Laterality: N/A;  . COLONOSCOPY    . COSMETIC SURGERY     360 degree he had a 360 plastic surgery operation last week where excess skin  was removed around his trunkhe had a 360 plastic surgery operation  excess skin was removed around his trunk  . INCISION AND DRAINAGE OF WOUND N/A 10/07/2015   Procedure: DEBRIDEMENT OF BACK WOUND;  Surgeon: Peggye Form, DO;  Location: [facility];  Service: Plastics;  Laterality: N/A;  . JOINT REPLACEMENT Bilateral    knees  . 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 REATTACHED TENDON Left 05/2011   LEFT LEG  . 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 TOTAL HIP ARTHROPLASTY     right Dr. Luiz Blare 12-08-17  . TOTAL HIP ARTHROPLASTY Right 12/08/2017   Procedure: RIGHT TOTAL HIP ARTHROPLASTY ANTERIOR;  Surgeon: Jodi Geralds, MD;  Location: [facility];  Service: Orthopedics;  Laterality: Right;  . TOTAL KNEE ARTHROPLASTY Left 03/06/2015   Procedure: TOTAL KNEE ARTHROPLASTY;  Surgeon: Jodi Geralds, MD;  Location: [facility];  Service: Orthopedics;  Laterality: Left;  . TOTAL KNEE ARTHROPLASTY Right 06/22/2015   Procedure: RIGHT TOTAL KNEE ARTHROPLASTY;  Surgeon: Jodi Geralds, MD;  Location: [facility];  Service: Orthopedics;  Laterality: Right;   Family History  Problem Relation Age of Onset  . Diabetes Mother    Social History   Socioeconomic History  . Marital status: Divorced    Spouse name: Not on file  . Number of children: 2  . Years of education: 15  . Highest education level: Associate degree: occupational, Scientist, product/process development, or vocational program  Occupational History  . Occupation: disabled    Comment: truck driver  Tobacco Use  . Smoking status: Light Tobacco Smoker    Years: 26.00    Types: Cigars  . Smokeless tobacco: Never Used  Vaping Use  . Vaping Use: Never used  Substance and Sexual Activity  . Alcohol use: No    Alcohol/week: 0.0 standard drinks  . Drug use: No  . Sexual activity: Yes    Partners: Female  Other Topics Concern  . Not on file  Social History Narrative   No caffeine during the day. 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    Social Determinants of Health   Financial Resource Strain: Low Risk   . Difficulty of Paying Living Expenses: Not hard at all  Food Insecurity: No Food Insecurity  . Worried About Programme researcher, broadcasting/film/video in the Last Year: Never true  . Ran Out of Food in the Last Year: Never true  Transportation Needs: No Transportation Needs  . Lack of Transportation (Medical): No  . Lack  of Transportation (Non-Medical): No  Physical Activity: Inactive  . Days of Exercise per Week: 0 days  . Minutes of Exercise per Session: 0 min  Stress: Not on file  Social Connections: Socially Isolated  . Frequency of Communication with Friends and Family: More than three times a week  . Frequency of Social Gatherings with Friends and Family: More than three times a week  . Attends Religious Services: Never  . Active Member of Clubs or Organizations: No  . Attends Banker Meetings: Never  . Marital Status: Divorced    Activities of Daily Living In your present state of health, do you have any difficulty performing the following activities: 12/09/2020  Hearing? N  Vision? N  Difficulty concentrating or making decisions? N  Walking or climbing stairs? N  Dressing or bathing? N  Doing errands, shopping? N  Preparing Food and eating ? N  Using the Toilet? N  In the past six months, have you accidently leaked urine? N  Do you have problems with loss of bowel control? N  Managing your Medications? N  Managing your Finances? N  Housekeeping or managing your Housekeeping? N  Some recent data might be hidden    Patient Education/ Literacy How often do you need to have someone help you when you read instructions, pamphlets, or other written materials from your doctor or pharmacy?: 1 - Never What is the last grade level you completed in school?: Associates degree  Exercise Current Exercise Habits: The patient does not participate in regular exercise at present, Exercise limited by: orthopedic condition(s)  Diet Patient reports consuming 3 meals a day and 1-2 snack(s) a day Patient reports that his primary diet is: Regular Patient reports that she does have regular access to food.   Depression Screen PHQ 2/9 Scores 12/09/2020 08/01/2018 03/29/2018 08/23/2017  PHQ - 2 Score 0 0 0 0  PHQ- 9 Score - - 0 -     Fall Risk Fall Risk  12/09/2020 08/01/2018 03/29/2018 08/23/2017   Falls in the past year? 0 No No No  Number falls in past yr: 0 - - -  Injury with Fall? 0 - - -  Risk for fall due to : No Fall Risks Impaired mobility - -  Follow up Falls evaluation completed - - -     Objective:  Jordan Bailey seemed alert and oriented and he participated appropriately during our telephone visit.  Blood Pressure Weight BMI  BP Readings from Last 3 Encounters:  05/06/20 122/77  07/30/19 120/77  08/01/18 125/80   Wt Readings from Last 3 Encounters:  05/06/20 272 lb (123.4 kg)  07/30/19 278 lb (126.1 kg)  08/01/18 250 lb (113.4 kg)   BMI Readings from Last 1 Encounters:  05/06/20 38.08 kg/m    *Unable to obtain current vital signs, weight, and BMI due to telephone visit type  Hearing/Vision  . Maleik did not seem to have difficulty with hearing/understanding during the telephone conversation . Reports that he has not had a formal eye exam by an eye care professional within the past year . Reports that he has not had a formal hearing evaluation within the past year *Unable to fully assess hearing and vision during telephone visit type  Cognitive Function: 6CIT Screen 12/09/2020 08/01/2018  What Year? 0 points 0 points  What month? 0 points 0 points  What time? 0 points 0 points  Count back from 20 0 points 0 points  Months in reverse 0 points 2 points  Repeat phrase 0 points 2 points  Total Score 0 4   (Normal:0-7, Significant for Dysfunction: >8)  Normal Cognitive Function Screening: Yes   Immunization & Health Maintenance Record Immunization History  Administered Date(s) Administered  . Tdap 04/30/2018    Health Maintenance  Topic Date Due  . COVID-19 Vaccine (1) 12/25/2020 (Originally 10/04/1970)  . INFLUENZA VACCINE  12/31/2020 (Originally 05/03/2020)  . Hepatitis C Screening  12/09/2021 (Originally 25-Mar-1966)  . Fecal DNA (Cologuard)  05/18/2021  . TETANUS/TDAP  04/30/2028  . HIV Screening  Completed  . HPV VACCINES  Aged Out        Assessment  This is a routine wellness examination for Tarance Balan.  Health Maintenance: Due or Overdue There are no preventive care reminders to display for this patient.  Samvel Zinn does not need a referral for Community Assistance: Care  Management:   no Social Work:    no Prescription Assistance:  no Nutrition/Diabetes Education:  no   Plan:  Personalized Goals Goals Addressed              This Visit's Progress   .  Patient Stated (pt-stated)        12/09/2020 AWV Goal: Exercise for General Health   Patient will verbalize understanding of the benefits of increased physical activity:  Exercising regularly is important. It will improve your overall fitness, flexibility, and endurance.  Regular exercise also will improve your overall health. It can help you control your weight, reduce stress, and improve your bone density.  Over the next year, patient will increase physical activity as tolerated with a goal of at least 150 minutes of moderate physical activity per week.   You can tell that you are exercising at a moderate intensity if your heart starts beating faster and you start breathing faster but can still hold a conversation.  Moderate-intensity exercise ideas include:  Walking 1 mile (1.6 km) in about 15 minutes  Biking  Hiking  Golfing  Dancing  Water aerobics  Patient will verbalize understanding of everyday activities that increase physical activity by providing examples like the following: ? Yard work, such as: ? Pushing a Surveyor, mininglawn mower ? Raking and bagging leaves ? Washing your car ? Pushing a stroller ? Shoveling snow ? Gardening ? Washing windows or floors  Patient will be able to explain general safety guidelines for exercising:   Before you start a new exercise program, talk with your health care provider.  Do not exercise so much that you hurt yourself, feel dizzy, or get very short of breath.  Wear comfortable clothes and wear shoes  with good support.  Drink plenty of water while you exercise to prevent dehydration or heat stroke.  Work out until your breathing and your heartbeat get faster.       Personalized Health Maintenance & Screening Recommendations  Colorectal cancer screening  Lung Cancer Screening Recommended: no (Low Dose CT Chest recommended if Age 56-80 years, 30 pack-year currently smoking OR have quit w/in past 15 years) Hepatitis C Screening recommended: no HIV Screening recommended: no  Advanced Directives: Written information was not prepared per patient's request.  Referrals & Orders Orders Placed This Encounter  Procedures  . Cologuard    Follow-up Plan . Follow-up with Everrett CoombeMatthews, Cody, DO as planned . Referral sent for you Cologuard. . Medicare wellness in one year.   I have personally reviewed and noted the following in the patient's chart:   . Medical and social history . Use of alcohol, tobacco or illicit drugs  . Current medications and supplements . Functional ability and status . Nutritional status . Physical activity . Advanced directives . List of other physicians . Hospitalizations, surgeries, and ER visits in previous 12 months . Vitals . Screenings to include cognitive, depression, and falls . Referrals and appointments  In addition, I have reviewed and discussed with Jordan Peacockoger Yeatman certain preventive protocols, quality metrics, and best practice recommendations. A written personalized care plan for preventive services as well as general preventive health recommendations is available and can be mailed to the patient at his request.      Modesto CharonBableen  Enzo Treu, RN  12/09/2020

## 2020-12-09 NOTE — Patient Instructions (Addendum)
Health Maintenance, Male Adopting a healthy lifestyle and getting preventive care are important in promoting health and wellness. Ask your health care provider about:  The right schedule for you to have regular tests and exams.  Things you can do on your own to prevent diseases and keep yourself healthy. What should I know about diet, weight, and exercise? Eat a healthy diet  Eat a diet that includes plenty of vegetables, fruits, low-fat dairy products, and lean protein.  Do not eat a lot of foods that are high in solid fats, added sugars, or sodium.   Maintain a healthy weight Body mass index (BMI) is a measurement that can be used to identify possible weight problems. It estimates body fat based on height and weight. Your health care provider can help determine your BMI and help you achieve or maintain a healthy weight. Get regular exercise Get regular exercise. This is one of the most important things you can do for your health. Most adults should:  Exercise for at least 150 minutes each week. The exercise should increase your heart rate and make you sweat (moderate-intensity exercise).  Do strengthening exercises at least twice a week. This is in addition to the moderate-intensity exercise.  Spend less time sitting. Even light physical activity can be beneficial. Watch cholesterol and blood lipids Have your blood tested for lipids and cholesterol at 54 years of age, then have this test every 5 years. You may need to have your cholesterol levels checked more often if:  Your lipid or cholesterol levels are high.  You are older than 54 years of age.  You are at high risk for heart disease. What should I know about cancer screening? Many types of cancers can be detected early and may often be prevented. Depending on your health history and family history, you may need to have cancer screening at various ages. This may include screening for:  Colorectal cancer.  Prostate  cancer.  Skin cancer.  Lung cancer. What should I know about heart disease, diabetes, and high blood pressure? Blood pressure and heart disease  High blood pressure causes heart disease and increases the risk of stroke. This is more likely to develop in people who have high blood pressure readings, are of African descent, or are overweight.  Talk with your health care provider about your target blood pressure readings.  Have your blood pressure checked: ? Every 3-5 years if you are 55-22 years of age. ? Every year if you are 41 years old or older.  If you are between the ages of 16 and 56 and are a current or former smoker, ask your health care provider if you should have a one-time screening for abdominal aortic aneurysm (AAA). Diabetes Have regular diabetes screenings. This checks your fasting blood sugar level. Have the screening done:  Once every three years after age 70 if you are at a normal weight and have a low risk for diabetes.  More often and at a younger age if you are overweight or have a high risk for diabetes. What should I know about preventing infection? Hepatitis B If you have a higher risk for hepatitis B, you should be screened for this virus. Talk with your health care provider to find out if you are at risk for hepatitis B infection. Hepatitis C Blood testing is recommended for:  Everyone born from [birth year] through 1964.  Anyone with known risk factors for hepatitis C. Sexually transmitted infections (STIs)  You should be screened  each year for STIs, including gonorrhea and chlamydia, if: ? You are sexually active and are younger than 54 years of age. ? You are older than 54 years of age and your health care provider tells you that you are at risk for this type of infection. ? Your sexual activity has changed since you were last screened, and you are at increased risk for chlamydia or gonorrhea. Ask your health care provider if you are at risk.  Ask your  health care provider about whether you are at high risk for HIV. Your health care provider may recommend a prescription medicine to help prevent HIV infection. If you choose to take medicine to prevent HIV, you should first get tested for HIV. You should then be tested every 3 months for as long as you are taking the medicine. Follow these instructions at home: Lifestyle  Do not use any products that contain nicotine or tobacco, such as cigarettes, e-cigarettes, and chewing tobacco. If you need help quitting, ask your health care provider.  Do not use street drugs.  Do not share needles.  Ask your health care provider for help if you need support or information about quitting drugs. Alcohol use  Do not drink alcohol if your health care provider tells you not to drink.  If you drink alcohol: ? Limit how much you have to 0-2 drinks a day. ? Be aware of how much alcohol is in your drink. In the [place] one drink equals one 12 oz bottle of beer (355 mL), one 5 oz glass of wine (148 mL), or one 1 oz glass of hard liquor (44 mL). General instructions  Schedule regular health, dental, and eye exams.  Stay current with your vaccines.  Tell your health care provider if: ? You often feel depressed. ? You have ever been abused or do not feel safe at home. Summary  Adopting a healthy lifestyle and getting preventive care are important in promoting health and wellness.  Follow your health care provider's instructions about healthy diet, exercising, and getting tested or screened for diseases.  Follow your health care provider's instructions on monitoring your cholesterol and blood pressure. This information is not intended to replace advice given to you by your health care provider. Make sure you discuss any questions you have with your health care provider. Document Revised: 09/12/2018 Document Reviewed: 09/12/2018 Elsevier Patient Education  2021 Elsevier Inc.    MEDICARE [place]  VISIT Health Maintenance Summary and Written Plan of Care  Mr. Claudette Laws ,  Thank you for allowing me to perform your Medicare Annual Wellness Visit and for your ongoing commitment to your health.   Health Maintenance & Immunization History Health Maintenance  Topic Date Due  . COVID-19 Vaccine (1) 12/25/2020 (Originally 10/04/1970)  . INFLUENZA VACCINE  12/31/2020 (Originally 05/03/2020)  . Hepatitis C Screening  12/09/2021 (Originally 29-Oct-1965)  . Fecal DNA (Cologuard)  05/18/2021  . TETANUS/TDAP  04/30/2028  . HIV Screening  Completed  . HPV VACCINES  Aged Out   Immunization History  Administered Date(s) Administered  . Tdap 04/30/2018    These are the patient goals that we discussed: Goals Addressed              This Visit's Progress   .  Patient Stated (pt-stated)        12/09/2020 AWV Goal: Exercise for General Health   Patient will verbalize understanding of the benefits of increased physical activity:  Exercising regularly is important. It will improve your  overall fitness, flexibility, and endurance.  Regular exercise also will improve your overall health. It can help you control your weight, reduce stress, and improve your bone density.  Over the next year, patient will increase physical activity as tolerated with a goal of at least 150 minutes of moderate physical activity per week.   You can tell that you are exercising at a moderate intensity if your heart starts beating faster and you start breathing faster but can still hold a conversation.  Moderate-intensity exercise ideas include:  Walking 1 mile (1.6 km) in about 15 minutes  Biking  Hiking  Golfing  Dancing  Water aerobics  Patient will verbalize understanding of everyday activities that increase physical activity by providing examples like the following: ? Yard work, such as: ? Pushing a Surveyor, mining ? Raking and bagging leaves ? Washing your car ? Pushing a stroller ? Shoveling  snow ? Gardening ? Washing windows or floors  Patient will be able to explain general safety guidelines for exercising:   Before you start a new exercise program, talk with your health care provider.  Do not exercise so much that you hurt yourself, feel dizzy, or get very short of breath.  Wear comfortable clothes and wear shoes with good support.  Drink plenty of water while you exercise to prevent dehydration or heat stroke.  Work out until your breathing and your heartbeat get faster.         This is a list of Health Maintenance Items that are overdue or due now: There are no preventive care reminders to display for this patient.   Orders/Referrals Placed Today: Orders Placed This Encounter  Procedures  . Cologuard   (Contact our referral department at [phone] if you have not spoken with someone about your referral appointment within the next 5 days)    Follow-up Plan

## 2021-04-08 ENCOUNTER — Other Ambulatory Visit: Payer: Self-pay

## 2021-04-08 ENCOUNTER — Ambulatory Visit: Payer: Medicare Other | Admitting: Family Medicine

## 2021-04-16 ENCOUNTER — Ambulatory Visit (INDEPENDENT_AMBULATORY_CARE_PROVIDER_SITE_OTHER): Payer: Medicare Other

## 2021-04-16 ENCOUNTER — Ambulatory Visit (INDEPENDENT_AMBULATORY_CARE_PROVIDER_SITE_OTHER): Payer: Medicare Other | Admitting: Podiatry

## 2021-04-16 ENCOUNTER — Other Ambulatory Visit: Payer: Self-pay | Admitting: Podiatry

## 2021-04-16 ENCOUNTER — Other Ambulatory Visit: Payer: Self-pay

## 2021-04-16 ENCOUNTER — Encounter: Payer: Self-pay | Admitting: Podiatry

## 2021-04-16 DIAGNOSIS — L84 Corns and callosities: Secondary | ICD-10-CM

## 2021-04-16 DIAGNOSIS — M79671 Pain in right foot: Secondary | ICD-10-CM

## 2021-04-16 DIAGNOSIS — M21619 Bunion of unspecified foot: Secondary | ICD-10-CM | POA: Diagnosis not present

## 2021-04-16 DIAGNOSIS — M2042 Other hammer toe(s) (acquired), left foot: Secondary | ICD-10-CM

## 2021-04-16 NOTE — Progress Notes (Signed)
Subjective:   Patient ID: Jordan Bailey, male   DOB: 55 y.o.   MRN: [id number]   HPI Patient presents with quite a bit of pain in his left third toe and moderate second toe flatfoot deformity with history of discomfort in the right ankle and medial ankle with patient wearing AFO brace with pleat collapse of the arch    ROS      Objective:  Physical Exam  Neurovascular status intact with collapse medial longitudinal arch right with inflammation of the posterior tibial tendon and into the sinus tarsi mild in nature with significant changes with on the left foot rigid contracture digit to left structural bunion deviation of the hallux and distal keratotic lesion digit 3 left     Assessment:  Multiple foot problems with lesion formation second third toe left with structural deformity left and collapse medial longitudinal arch right     Plan:  H&P both conditions reviewed discussed and for the right continue AFO bracing along with stretching exercises and for the left did discuss digital fusion bunion correction probable Akin type osteotomy and today debrided 2 calluses to take pressure off the area.  Reappoint for [place] to recheck as symptoms indicate and ultimately will probably require surgery for the left foot  X-rays indicate significant structural bunion deformity left elevation of the intermetatarsal angle of approximately 15 degrees with hallux interphalange ES and rigid contracture digit to 3 left foot

## 2021-04-16 NOTE — Progress Notes (Signed)
G left  °

## 2021-05-13 LAB — FECAL OCCULT BLOOD, GUAIAC: Fecal Occult Blood: NEGATIVE

## 2021-05-21 ENCOUNTER — Telehealth: Payer: Self-pay | Admitting: Family Medicine

## 2021-05-21 LAB — COLOGUARD: Cologuard: NEGATIVE

## 2021-05-21 NOTE — Telephone Encounter (Signed)
Patient states he talk to [facility] and came in to the office to drop a parking form off to be  updated and filled out. I will place in Panya's basket .

## 2021-06-01 ENCOUNTER — Encounter: Payer: Self-pay | Admitting: Family Medicine

## 2021-09-09 ENCOUNTER — Other Ambulatory Visit: Payer: Self-pay

## 2021-09-09 ENCOUNTER — Encounter: Payer: Self-pay | Admitting: Family Medicine

## 2021-09-09 ENCOUNTER — Ambulatory Visit (INDEPENDENT_AMBULATORY_CARE_PROVIDER_SITE_OTHER): Payer: Medicare Other | Admitting: Family Medicine

## 2021-09-09 VITALS — BP 134/83 | HR 85 | Ht 71.0 in | Wt 256.0 lb

## 2021-09-09 DIAGNOSIS — E538 Deficiency of other specified B group vitamins: Secondary | ICD-10-CM

## 2021-09-09 DIAGNOSIS — D508 Other iron deficiency anemias: Secondary | ICD-10-CM

## 2021-09-09 DIAGNOSIS — Z9884 Bariatric surgery status: Secondary | ICD-10-CM | POA: Diagnosis not present

## 2021-09-09 DIAGNOSIS — R200 Anesthesia of skin: Secondary | ICD-10-CM | POA: Diagnosis not present

## 2021-09-09 DIAGNOSIS — I872 Venous insufficiency (chronic) (peripheral): Secondary | ICD-10-CM

## 2021-09-09 NOTE — Progress Notes (Signed)
Jordan Bailey - 55 y.o. male MRN [id number]  Date of birth: 1966/04/06  Subjective Chief Complaint  Patient presents with   Hand Pain   Edema    HPI Jordan Bailey is a 55 year old male here today with complaint of bilateral hand pain with numbness and tingling.  He has had this for a couple months.  He has noticed some weakness with grip strength, worse in the right hand.  He is right-handed.  He denies any recent injury.  He denies any numbness or tingling in lower extremities.  He does have prior history of weight loss surgery and B12 deficiency.  He does not have a history of diabetes.  He has noted a little more swelling at the top of his socks at the end of the day.  He is on his feet quite a bit during the day.  He denies any pain associated with this, shortness of breath or chest pain.  ROS:  A comprehensive ROS was completed and negative except as noted per HPI  No Known Allergies  Past Medical History:  Diagnosis Date   Arthritis    History of kidney stones    Iron deficiency anemia     Past Surgical History:  Procedure Laterality Date   APPLICATION OF A-CELL OF BACK N/A 10/07/2015   Procedure: APPLICATION OF ACELL AND VAC DRESSING TO BACK WOUND;  Surgeon: Peggye Form, DO;  Location: [facility];  Service: Plastics;  Laterality: N/A;   COLONOSCOPY     COSMETIC SURGERY     360 degree he had a 360 plastic surgery operation last week where excess skin was removed around his trunkhe had a 360 plastic surgery operation  excess skin was removed around his trunk   INCISION AND DRAINAGE OF WOUND N/A 10/07/2015   Procedure: DEBRIDEMENT OF BACK WOUND;  Surgeon: Peggye Form, DO;  Location: [facility];  Service: Plastics;  Laterality: N/A;   JOINT REPLACEMENT Bilateral    knees   LAPAROSCOPIC GASTRIC SLEEVE RESECTION  05/21/2013   REATTACHED TENDON Left 05/2011   LEFT LEG   TOTAL HIP ARTHROPLASTY Left 08/28/2017   Procedure: TOTAL HIP ARTHROPLASTY  ANTERIOR APPROACH;  Surgeon: Jodi Geralds, MD;  Location: [facility];  Service: Orthopedics;  Laterality: Left;   TOTAL HIP ARTHROPLASTY     right Dr. Luiz Blare 12-08-17   TOTAL HIP ARTHROPLASTY Right 12/08/2017   Procedure: RIGHT TOTAL HIP ARTHROPLASTY ANTERIOR;  Surgeon: Jodi Geralds, MD;  Location: [facility];  Service: Orthopedics;  Laterality: Right;   TOTAL KNEE ARTHROPLASTY Left 03/06/2015   Procedure: TOTAL KNEE ARTHROPLASTY;  Surgeon: Jodi Geralds, MD;  Location: [facility];  Service: Orthopedics;  Laterality: Left;   TOTAL KNEE ARTHROPLASTY Right 06/22/2015   Procedure: RIGHT TOTAL KNEE ARTHROPLASTY;  Surgeon: Jodi Geralds, MD;  Location: [facility];  Service: Orthopedics;  Laterality: Right;    Social History   Socioeconomic History   Marital status: Divorced    Spouse name: Not on file   Number of children: 2   Years of education: 12   Highest education level: Associate degree: occupational, Scientist, product/process development, or vocational program  Occupational History   Occupation: disabled    Comment: truck driver  Tobacco Use   Smoking status: Light Smoker    Types: Cigars   Smokeless tobacco: Never  Vaping Use   Vaping Use: Never used  Substance and Sexual Activity   Alcohol use: No    Alcohol/week: 0.0 standard drinks   Drug use: No  Sexual activity: Yes    Partners: Female  Other Topics Concern   Not on file  Social History Narrative   No caffeine during the day. 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    Social Determinants of Health   Financial Resource Strain: Low Risk    Difficulty of Paying Living Expenses: Not hard at all  Food Insecurity: No Food Insecurity   Worried About Programme researcher, broadcasting/film/video in the Last Year: Never true   Ran Out of Food in the Last Year: Never true  Transportation Needs: No Transportation Needs   Lack of Transportation (Medical): No   Lack of Transportation (Non-Medical): No  Physical Activity: Inactive   Days of Exercise per Week: 0 days   Minutes of Exercise per Session: 0  min  Stress: Not on file  Social Connections: Socially Isolated   Frequency of Communication with Friends and Family: More than three times a week   Frequency of Social Gatherings with Friends and Family: More than three times a week   Attends Religious Services: Never   Database administrator or Organizations: No   Attends Engineer, structural: Never   Marital Status: Divorced    Family History  Problem Relation Age of Onset   Diabetes Mother     Health Maintenance  Topic Date Due   COVID-19 Vaccine (1) Never done   Pneumococcal Vaccine 42-36 Years old (1 - PCV) Never done   Zoster Vaccines- Shingrix (1 of 2) Never done   INFLUENZA VACCINE  Never done   Hepatitis C Screening  12/09/2021 (Originally 10[birth year]03/1984)   Fecal DNA (Cologuard)  05/21/2024   TETANUS/TDAP  04/30/2028   HIV Screening  Completed   HPV VACCINES  Aged Out     ----------------------------------------------------------------------------------------------------------------------------------------------------------------------------------------------------------------- Physical Exam BP 134/83 (BP Location: Left Wrist, Patient Position: Sitting, Cuff Size: Normal)   Pulse 85   Ht 5\' 11"  (1.803 m)   Wt 256 lb (116.1 kg)   SpO2 95%   BMI 35.70 kg/m   Physical Exam Constitutional:      Appearance: Normal appearance.  Eyes:     General: No scleral icterus. Cardiovascular:     Rate and Rhythm: Normal rate and regular rhythm.  Pulmonary:     Effort: Pulmonary effort is normal.     Breath sounds: Normal breath sounds.  Musculoskeletal:     Cervical back: Neck supple.     Comments: Mildly positive Tinel's bilaterally at the carpal tunnel.  Positive Phalen's test.  Grip strength is fairly good.  Neurological:     Mental Status: He is alert.  Psychiatric:        Mood and Affect: Mood normal.        Behavior: Behavior normal.     ------------------------------------------------------------------------------------------------------------------------------------------------------------------------------------------------------------------- Assessment and Plan  B12 deficiency Updating B12 levels.  Anemia Recheck CBC and iron  Numbness in both hands He has symptoms consistent with carpal tunnel syndrome.  We discussed following up with Dr. for discussion of Hydro dissection of median nerve.  Venous insufficiency Recommend compression stockings.   No orders of the defined types were placed in this encounter.   No follow-ups on file.    This visit occurred during the SARS-CoV-2 public health emergency.  Safety protocols were in place, including screening questions prior to the visit, additional usage of staff PPE, and extensive cleaning of exam room while observing appropriate contact time as indicated for disinfecting solutions.

## 2021-09-09 NOTE — Patient Instructions (Signed)

## 2021-09-09 NOTE — Assessment & Plan Note (Signed)
Recommend compression stockings

## 2021-09-09 NOTE — Assessment & Plan Note (Signed)
Recheck CBC and iron 

## 2021-09-09 NOTE — Assessment & Plan Note (Signed)
Updating B12 levels. 

## 2021-09-09 NOTE — Assessment & Plan Note (Signed)
He has symptoms consistent with carpal tunnel syndrome.  We discussed following up with Dr. Benjamin Stain for discussion of Hydro dissection of median nerve.

## 2021-09-10 LAB — CBC WITH DIFFERENTIAL/PLATELET
Absolute Monocytes: 957 cells/uL — ABNORMAL HIGH (ref 200–950)
Basophils Absolute: 18 cells/uL (ref 0–200)
Basophils Relative: 0.4 %
Eosinophils Absolute: 9 cells/uL — ABNORMAL LOW (ref 15–500)
Eosinophils Relative: 0.2 %
HCT: 39.8 % (ref 38.5–50.0)
Hemoglobin: 12.8 g/dL — ABNORMAL LOW (ref 13.2–17.1)
Lymphs Abs: 1711 cells/uL (ref 850–3900)
MCH: 24.6 pg — ABNORMAL LOW (ref 27.0–33.0)
MCHC: 32.2 g/dL (ref 32.0–36.0)
MCV: 76.4 fL — ABNORMAL LOW (ref 80.0–100.0)
MPV: 10.3 fL (ref 7.5–12.5)
Monocytes Relative: 20.8 %
Neutro Abs: 1904 cells/uL (ref 1500–7800)
Neutrophils Relative %: 41.4 %
Platelets: 202 10*3/uL (ref 140–400)
RBC: 5.21 10*6/uL (ref 4.20–5.80)
RDW: 14.2 % (ref 11.0–15.0)
Total Lymphocyte: 37.2 %
WBC: 4.6 10*3/uL (ref 3.8–10.8)

## 2021-09-10 LAB — IRON,TIBC AND FERRITIN PANEL
%SAT: 11 % (calc) — ABNORMAL LOW (ref 20–48)
Ferritin: 225 ng/mL (ref 38–380)
Iron: 28 ug/dL — ABNORMAL LOW (ref 50–180)
TIBC: 250 mcg/dL (calc) (ref 250–425)

## 2021-09-10 LAB — COMPLETE METABOLIC PANEL WITH GFR
AG Ratio: 1.4 (calc) (ref 1.0–2.5)
ALT: 9 U/L (ref 9–46)
AST: 18 U/L (ref 10–35)
Albumin: 3.8 g/dL (ref 3.6–5.1)
Alkaline phosphatase (APISO): 65 U/L (ref 35–144)
BUN: 12 mg/dL (ref 7–25)
CO2: 23 mmol/L (ref 20–32)
Calcium: 8.7 mg/dL (ref 8.6–10.3)
Chloride: 108 mmol/L (ref 98–110)
Creat: 0.96 mg/dL (ref 0.70–1.30)
Globulin: 2.7 g/dL (calc) (ref 1.9–3.7)
Glucose, Bld: 98 mg/dL (ref 65–99)
Potassium: 4.3 mmol/L (ref 3.5–5.3)
Sodium: 140 mmol/L (ref 135–146)
Total Bilirubin: 0.4 mg/dL (ref 0.2–1.2)
Total Protein: 6.5 g/dL (ref 6.1–8.1)
eGFR: 94 mL/min/{1.73_m2} (ref >=60)

## 2021-09-10 LAB — HEMOGLOBIN A1C
Hgb A1c MFr Bld: 5.4 % of total Hgb (ref <5.7)
Mean Plasma Glucose: 108 mg/dL
eAG (mmol/L): 6 mmol/L

## 2021-09-10 LAB — VITAMIN B12: Vitamin B-12: 428 pg/mL (ref 200–1100)

## 2021-09-13 ENCOUNTER — Encounter: Payer: Self-pay | Admitting: Family Medicine

## 2021-09-14 MED ORDER — FERROUS SULFATE 325 (65 FE) MG PO TABS: 325.0000 mg | ORAL_TABLET | Freq: Three times a day (TID) | ORAL | 3 refills | Status: DC

## 2021-10-12 ENCOUNTER — Ambulatory Visit: Payer: 59 | Admitting: Sports Medicine

## 2021-10-12 ENCOUNTER — Other Ambulatory Visit: Payer: Self-pay

## 2021-10-12 DIAGNOSIS — G5603 Carpal tunnel syndrome, bilateral upper limbs: Secondary | ICD-10-CM | POA: Diagnosis not present

## 2021-10-12 DIAGNOSIS — M75101 Unspecified rotator cuff tear or rupture of right shoulder, not specified as traumatic: Secondary | ICD-10-CM

## 2021-10-12 NOTE — Progress Notes (Signed)
° ° °  Procedures performed today:    None.  Independent interpretation of notes and tests performed by another provider:   None.  Brief History, Exam, Impression, and Recommendations:    Carpal tunnel syndrome, bilateral This is a pleasant 56 year old male, for the past couple of months has had increasing numbness, tingling both hands and wrists, worse in the morning and at night. On exam he has a negative Tinel sign, positive Phalen sign, negative Spurling sign. Minimal thenar atrophy, we will start conservatively, bilateral nocturnal splinting, home conditioning. Return to see me in 6 weeks, bilateral Hydro dissections if no better.  Rotator cuff tear, right Jaloni also has the classic difficulty abducting his right shoulder when shaking hands, he has dealt with this for a long time, he has weakness consistent with a supraspinatus tear, we will put this on the back burner for now he will think about whether or not he would like me to evaluate.    ___________________________________________ Gwen Her. Dianah Field, M.D., ABFM., CAQSM. Primary Care and [place] Instructor of [place] [employer] [facility] of Medicine

## 2021-10-12 NOTE — Assessment & Plan Note (Signed)
Jordan Bailey also has the classic difficulty abducting his right shoulder when shaking hands, he has dealt with this for a long time, he has weakness consistent with a supraspinatus tear, we will put this on the back burner for now he will think about whether or not he would like me to evaluate.

## 2021-10-12 NOTE — Assessment & Plan Note (Signed)
This is a pleasant 56 year old male, for the past couple of months has had increasing numbness, tingling both hands and wrists, worse in the morning and at night. On exam he has a negative Tinel sign, positive Phalen sign, negative Spurling sign. Minimal thenar atrophy, we will start conservatively, bilateral nocturnal splinting, home conditioning. Return to see me in 6 weeks, bilateral Hydro dissections if no better.

## 2021-11-23 ENCOUNTER — Other Ambulatory Visit: Payer: Self-pay

## 2021-11-23 ENCOUNTER — Encounter: Payer: 59 | Admitting: Sports Medicine

## 2021-11-23 NOTE — Progress Notes (Deleted)
    Procedures performed today:    None.  Independent interpretation of notes and tests performed by another provider:   None.  Brief History, Exam, Impression, and Recommendations:    No problem-specific Assessment & Plan notes found for this encounter.    ___________________________________________ Sarvesh Meddaugh J. Manuella Blackson, M.D., ABFM., CAQSM. Primary Care and Sports Medicine [facility]   Adjunct Instructor of Family Medicine  [employer] 

## 2021-11-24 NOTE — Progress Notes (Signed)
This encounter was created in error - please disregard.

## 2021-11-29 ENCOUNTER — Other Ambulatory Visit: Payer: Self-pay

## 2021-11-29 ENCOUNTER — Ambulatory Visit (INDEPENDENT_AMBULATORY_CARE_PROVIDER_SITE_OTHER): Payer: 59 | Admitting: Sports Medicine

## 2021-11-29 ENCOUNTER — Ambulatory Visit (INDEPENDENT_AMBULATORY_CARE_PROVIDER_SITE_OTHER): Payer: 59

## 2021-11-29 DIAGNOSIS — G5603 Carpal tunnel syndrome, bilateral upper limbs: Secondary | ICD-10-CM | POA: Diagnosis not present

## 2021-11-29 NOTE — Progress Notes (Signed)
° ° °  Procedures performed today:    Procedure: Real-time Ultrasound Guided hydrodissection of the left median nerve at the carpal tunnel Device: Samsung HS60 Verbal informed consent obtained.  Time-out conducted.  Noted no overlying erythema, induration, or other signs of local infection.  Skin prepped in a sterile fashion.  Local anesthesia: Topical Ethyl chloride.  With sterile technique and under real time ultrasound guidance: Noted enlarged median nerve.  Using a 25-gauge needle advanced into the carpal tunnel, taking care to avoid intraneural injection I injected medication both superficial to and deep to the median nerve freeing it from surrounding structures, I then redirected the needle deep and injected further medication around the flexor tendons deep within the carpal tunnel for a total of 1 cc kenalog 40, 5 cc 1% lidocaine without epinephrine. Completed without difficulty  Advised to call if fevers/chills, erythema, induration, drainage, or persistent bleeding.  Images permanently stored and available for review in PACS.  Impression: Technically successful ultrasound guided median nerve hydrodissection.  Procedure: Real-time Ultrasound Guided hydrodissection of the right median nerve at the carpal tunnel Device: Samsung HS60 Verbal informed consent obtained.  Time-out conducted.  Noted no overlying erythema, induration, or other signs of local infection.  Skin prepped in a sterile fashion.  Local anesthesia: Topical Ethyl chloride.  With sterile technique and under real time ultrasound guidance: Noted enlarged median nerve.  Using a 25-gauge needle advanced into the carpal tunnel, taking care to avoid intraneural injection I injected medication both superficial to and deep to the median nerve freeing it from surrounding structures, I then redirected the needle deep and injected further medication around the flexor tendons deep within the carpal tunnel for a total of 1 cc kenalog  40, 5 cc 1% lidocaine without epinephrine. Completed without difficulty  Advised to call if fevers/chills, erythema, induration, drainage, or persistent bleeding.  Images permanently stored and available for review in PACS.  Impression: Technically successful ultrasound guided median nerve hydrodissection.  Independent interpretation of notes and tests performed by another provider:   None.  Brief History, Exam, Impression, and Recommendations:    Carpal tunnel syndrome, bilateral This is a pleasant 56 year old male, he has had increasing numbness and tingling both hands and wrists mostly nocturnal in the morning. At the last visit he had a negative Tinel sign, positive Phalen sign, negative Spurling sign bilaterally. Minimal thenar atrophy, he failed nighttime splinting, home conditioning so today we performed bilateral median nerve hydrodissection's, return to see me in a month.    ___________________________________________ Ihor Austin. Benjamin Stain, M.D., ABFM., CAQSM. Primary Care and Sports Medicine [facility]  Adjunct Instructor of Family Medicine  [employer]

## 2021-11-29 NOTE — Assessment & Plan Note (Signed)
This is a pleasant 56 year old male, he has had increasing numbness and tingling both hands and wrists mostly nocturnal in the morning. At the last visit he had a negative Tinel sign, positive Phalen sign, negative Spurling sign bilaterally. Minimal thenar atrophy, he failed nighttime splinting, home conditioning so today we performed bilateral median nerve hydrodissection's, return to see me in a month.

## 2021-12-23 ENCOUNTER — Other Ambulatory Visit: Payer: Self-pay

## 2021-12-23 ENCOUNTER — Ambulatory Visit (INDEPENDENT_AMBULATORY_CARE_PROVIDER_SITE_OTHER): Payer: 59 | Admitting: Sports Medicine

## 2021-12-23 DIAGNOSIS — S43005A Unspecified dislocation of left shoulder joint, initial encounter: Secondary | ICD-10-CM | POA: Insufficient documentation

## 2021-12-23 NOTE — Assessment & Plan Note (Addendum)
This is a pleasant 56 year old male, yesterday he was refueling a crane, slipped and fell and dislocated his shoulder, he was able to get it back in himself, it dislocated again and then self reduced a second time. 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 ?He is in a sling now, we do need to keep him in the sling for at least a couple weeks before starting some physical therapy. ?He has some pain medication that he can use. 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 ?In the meantime I am going to put him on right-handed duty only. ?Tentatively he can see me back in about 2 weeks. ?Ultimately the plan will be 2 weeks of immobilization followed by 4 to 6 weeks of physical therapy starting with 2 weeks of passive motion, 2 weeks of active motion in 2 weeks of strengthening. ?If he has significant pain along the way we can do a glenohumeral joint injection considering his severe osteoarthritis. ?If all of the above fails he will need a reverse shoulder arthroplasty. ?

## 2021-12-23 NOTE — Progress Notes (Signed)
? ? ?  Procedures performed today:   ? ?None. ? ?Independent interpretation of notes and tests performed by another provider:  ? ?None. ? ?Brief History, Exam, Impression, and Recommendations:   ? ?Dislocation, shoulder, left, initial encounter ?This is a pleasant 56 year old male, yesterday he was refueling a crane, slipped and fell and dislocated his shoulder, he was able to get it back in himself, it dislocated again and then self reduced a second time. 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 ?He is in a sling now, we do need to keep him in the sling for at least a couple weeks before starting some physical therapy. ?He has some pain medication that he can use. 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 ?In the meantime I am going to put him on right-handed duty only. ?Tentatively he can see me back in about 2 weeks. ?Ultimately the plan will be 2 weeks of immobilization followed by 4 to 6 weeks of physical therapy starting with 2 weeks of passive motion, 2 weeks of active motion in 2 weeks of strengthening. ?If he has significant pain along the way we can do a glenohumeral joint injection considering his severe osteoarthritis. ?If all of the above fails he will need a reverse shoulder arthroplasty. ? ? ? ?___________________________________________ ?Gwen Her. Dianah Field, M.D., ABFM., CAQSM. ?Primary Care and Sports Medicine ?[place] ? ?Adjunct Instructor of Family Medicine  ?[employer] ?

## 2021-12-24 ENCOUNTER — Telehealth: Payer: Self-pay | Admitting: General Practice

## 2021-12-24 NOTE — Telephone Encounter (Signed)
Transition Care Management Follow-up Telephone Call ?Date of discharge and from where: 12/22/21 from [facility] ?How have you been since you were released from the hospital? Patient had OV with Dr. Letitia Libra)  on 12/23/21. ?Any questions or concerns? No ? ?

## 2021-12-28 ENCOUNTER — Telehealth: Payer: Self-pay | Admitting: Family Medicine

## 2021-12-28 ENCOUNTER — Ambulatory Visit (INDEPENDENT_AMBULATORY_CARE_PROVIDER_SITE_OTHER): Payer: 59 | Admitting: Sports Medicine

## 2021-12-28 ENCOUNTER — Other Ambulatory Visit: Payer: Self-pay

## 2021-12-28 DIAGNOSIS — G5603 Carpal tunnel syndrome, bilateral upper limbs: Secondary | ICD-10-CM | POA: Diagnosis not present

## 2021-12-28 NOTE — Telephone Encounter (Signed)
Pt dropped off work status report along with referral. Info put in Drs box. ?

## 2021-12-28 NOTE — Progress Notes (Signed)
? ? ?  Procedures performed today:   ? ?None. ? ?Independent interpretation of notes and tests performed by another provider:  ? ?None. ? ?Brief History, Exam, Impression, and Recommendations:   ? ?Carpal tunnel syndrome, bilateral ?Jordan Bailey returns, he is a pleasant 56 year old male, increased numbness and tingling both hands, nocturnal and in the morning, median nerve distribution, he did have a negative Tinel sign, positive Phalen sign, negative Spurling sign bilaterally. ?He had a bit of thenar atrophy as well. ?He had failed nighttime splinting, home conditioning, at the last visit we did bilateral ultrasound-guided median nerve hydrodissections, he has good relief of symptomatology. ?In the interim he had a fall at work and dislocated his left shoulder, shoulder was reduced and things are improving with regards to the shoulder however he is having slightly increased paresthesias thumb and index finger. ?He will be out of work for some time with the shoulder and due to slightly increased symptoms in his hands he is wondering if now would be the time to discuss a carpal tunnel release. ?I would like a consultation with our hand surgeon with regards to this. ? ? ? ?___________________________________________ ?Jordan Bailey. Jordan Bailey, M.D., ABFM., CAQSM. ?Primary Care and Sports Medicine ?[facility] Jordan Bailey ? ?Adjunct Instructor of Family Medicine  ?[employer] ?

## 2021-12-28 NOTE — Assessment & Plan Note (Signed)
Jordan Bailey returns, he is a pleasant 56 year old male, increased numbness and tingling both hands, nocturnal and in the morning, median nerve distribution, he did have a negative Tinel sign, positive Phalen sign, negative Spurling sign bilaterally. ?He had a bit of thenar atrophy as well. ?He had failed nighttime splinting, home conditioning, at the last visit we did bilateral ultrasound-guided median nerve hydrodissections, he has good relief of symptomatology. ?In the interim he had a fall at work and dislocated his left shoulder, shoulder was reduced and things are improving with regards to the shoulder however he is having slightly increased paresthesias thumb and index finger. ?He will be out of work for some time with the shoulder and due to slightly increased symptoms in his hands he is wondering if now would be the time to discuss a carpal tunnel release. ?I would like a consultation with our hand surgeon with regards to this. ?

## 2022-01-11 ENCOUNTER — Ambulatory Visit: Payer: 59 | Admitting: Sports Medicine

## 2022-02-22 ENCOUNTER — Other Ambulatory Visit: Payer: 59

## 2022-02-24 ENCOUNTER — Telehealth: Payer: Self-pay

## 2022-02-24 NOTE — Telephone Encounter (Signed)
Patient lvm stating that they needed to r/s appointment. I called patient back to find out if they needed to r/s their orthotic appointment.  He stated he got some new boots and they seemed to be working fine with the orthotics and will call back if needs another appointment

## 2022-03-30 ENCOUNTER — Ambulatory Visit: Payer: 59 | Admitting: Sports Medicine

## 2022-03-30 ENCOUNTER — Ambulatory Visit (INDEPENDENT_AMBULATORY_CARE_PROVIDER_SITE_OTHER): Payer: Medicare Other

## 2022-03-30 DIAGNOSIS — G5603 Carpal tunnel syndrome, bilateral upper limbs: Secondary | ICD-10-CM | POA: Diagnosis not present

## 2022-03-30 NOTE — Assessment & Plan Note (Addendum)
Pleasant 56 year old male, numbness tingling both hands, nocturnal, in the morning, median nerve distribution. Positive Phalen signs. Mild thenar atrophy. Last median nerve hydrodissection's were approximately in February, good relief, recurrence of pain so we are repeating this today. When he is ready we will rerefer for carpal tunnel release surgery. I did add home conditioning today.

## 2022-03-30 NOTE — Progress Notes (Signed)
    Procedures performed today:    Procedure: Real-time Ultrasound Guided hydrodissection of the left median nerve at the carpal tunnel Device: Samsung HS60 Verbal informed consent obtained.  Time-out conducted.  Noted no overlying erythema, induration, or other signs of local infection.  Skin prepped in a sterile fashion.  Local anesthesia: Topical Ethyl chloride.  With sterile technique and under real time ultrasound guidance: Noted enlarged median nerve.  Using a 25-gauge needle advanced into the carpal tunnel, taking care to avoid intraneural injection I injected medication both superficial to and deep to the median nerve freeing it from surrounding structures, I then redirected the needle deep and injected further medication around the flexor tendons deep within the carpal tunnel for a total of 1 cc kenalog 40, 5 cc 1% lidocaine without epinephrine. Completed without difficulty  Advised to call if fevers/chills, erythema, induration, drainage, or persistent bleeding.  Images permanently stored and available for review in PACS.  Impression: Technically successful ultrasound guided median nerve hydrodissection.   Procedure: Real-time Ultrasound Guided hydrodissection of the right median nerve at the carpal tunnel Device: Samsung HS60 Verbal informed consent obtained.  Time-out conducted.  Noted no overlying erythema, induration, or other signs of local infection.  Skin prepped in a sterile fashion.  Local anesthesia: Topical Ethyl chloride.  With sterile technique and under real time ultrasound guidance: Noted enlarged median nerve.  Using a 25-gauge needle advanced into the carpal tunnel, taking care to avoid intraneural injection I injected medication both superficial to and deep to the median nerve freeing it from surrounding structures, I then redirected the needle deep and injected further medication around the flexor tendons deep within the carpal tunnel for a total of 1 cc kenalog  40, 5 cc 1% lidocaine without epinephrine. Completed without difficulty  Advised to call if fevers/chills, erythema, induration, drainage, or persistent bleeding.  Images permanently stored and available for review in PACS.  Impression: Technically successful ultrasound guided median nerve hydrodissection.  Independent interpretation of notes and tests performed by another provider:   None.  Brief History, Exam, Impression, and Recommendations:    Carpal tunnel syndrome, bilateral Pleasant 56 year old male, numbness tingling both hands, nocturnal, in the morning, median nerve distribution. Positive Phalen signs. Mild thenar atrophy. Last median nerve hydrodissection's were approximately in February, good relief, recurrence of pain so we are repeating this today. When he is ready we will rerefer for carpal tunnel release surgery. I did add home conditioning today.  Chronic process with exacerbation and pharmacologic intervention  ____________________________________________ Jordan Bailey. Jordan Bailey, M.D., ABFM., CAQSM., AME. Primary Care and Sports Medicine [facility]  Adjunct Professor of Family Medicine  [place] [employer]  Restaurant manager, fast food

## 2022-04-28 ENCOUNTER — Telehealth: Payer: Self-pay

## 2022-04-28 DIAGNOSIS — G5603 Carpal tunnel syndrome, bilateral upper limbs: Secondary | ICD-10-CM

## 2022-04-28 NOTE — Telephone Encounter (Signed)
Patient called to report that the tingling is back in his hands. He just got the carpal tunnel injections not long ago. He asked if gabapentin would help this?

## 2022-04-29 MED ORDER — GABAPENTIN 300 MG PO CAPS: ORAL_CAPSULE | ORAL | 3 refills | Status: DC

## 2022-04-29 NOTE — Telephone Encounter (Signed)
Yes gabapentin would probably help but it is also time to do the surgery.  I will call in gabapentin to start.

## 2022-04-29 NOTE — Telephone Encounter (Signed)
Patient aware Rx sent to pharmacy. °

## 2022-05-24 ENCOUNTER — Ambulatory Visit (INDEPENDENT_AMBULATORY_CARE_PROVIDER_SITE_OTHER): Payer: Medicare Other | Admitting: Podiatry

## 2022-05-24 ENCOUNTER — Ambulatory Visit (INDEPENDENT_AMBULATORY_CARE_PROVIDER_SITE_OTHER): Payer: Medicare Other

## 2022-05-24 DIAGNOSIS — M21611 Bunion of right foot: Secondary | ICD-10-CM | POA: Diagnosis not present

## 2022-05-24 DIAGNOSIS — M21619 Bunion of unspecified foot: Secondary | ICD-10-CM

## 2022-05-24 DIAGNOSIS — M76829 Posterior tibial tendinitis, unspecified leg: Secondary | ICD-10-CM | POA: Diagnosis not present

## 2022-05-24 NOTE — Progress Notes (Signed)
Subjective: Chief Complaint  Patient presents with   Foot Pain    Pt came in for new orthotics on the right foot, pt is wearing a brace on that foot, pt is having pain in the arch of the     56 year old male presents the office today to discuss his brace as well as inserts.  He states that he feels that the brace  is rubbing the inside of his ankle causing irritation and pain.  He denies any recent injury.  No increase in swelling.  Objective: AAO x3, NAD DP/PT pulses palpable bilaterally, CRT less than 3 seconds Significant decrease in medial arch upon weightbearing.  There is no significant area pinpoint tenderness there is no pain along the course of the posterior tibial, flexor tendons today.  Feels that the brace is rubbing the inside of the ankle.  There is no open lesions noted. No pain with calf compression, swelling, warmth, erythema  Assessment: PTTD, brace irritation  Plan: -All treatment options discussed with the patient including all alternatives, risks, complications.  -X-rays obtained and reviewed of the right foot.  3 views obtained.  Soft tissue edema noted.  Significant is present.  Arthritis, subtalar joint, talonavicular joint. -I did a pad and the brace is worn out.  May need to have this adjusted.  When to make an appointment to come in so we can work on this for him.  Also he brought up his work boots.  Unfortunately I was not able to find the catalog today to look for this and the orthotics department was out of the office today.  We will reappoint him for modifications of the brace as well as possible work boots. -Patient encouraged to call the office with any questions, concerns, change in symptoms.   Vivi Barrack DPM

## 2022-05-25 ENCOUNTER — Encounter: Payer: Self-pay | Admitting: General Practice

## 2022-06-02 ENCOUNTER — Telehealth: Payer: Self-pay

## 2022-06-02 NOTE — Telephone Encounter (Signed)
Patient is scheduled   

## 2022-06-02 NOTE — Telephone Encounter (Signed)
Patient called to inquire about having a sleep study and possible CPAP. His son uses one and reportedly is not tired when he wakes up. Patient reports feeling tired when he wakes and that he does snore. No evidence of stopping breathing during his sleep per girlfriend. Please advise.

## 2022-06-07 ENCOUNTER — Encounter: Payer: Self-pay | Admitting: Family Medicine

## 2022-06-07 ENCOUNTER — Telehealth (INDEPENDENT_AMBULATORY_CARE_PROVIDER_SITE_OTHER): Payer: Medicare Other | Admitting: Family Medicine

## 2022-06-07 VITALS — Ht 71.0 in | Wt 260.0 lb

## 2022-06-07 DIAGNOSIS — R0683 Snoring: Secondary | ICD-10-CM

## 2022-06-07 DIAGNOSIS — R400 Somnolence: Secondary | ICD-10-CM | POA: Diagnosis not present

## 2022-06-07 NOTE — Progress Notes (Signed)
Jordan Bailey - 56 y.o. male MRN [id number]  Date of birth: 1965-10-29   This visit type was conducted due to national recommendations for restrictions regarding the COVID-19 Pandemic (e.g. social distancing).  This format is felt to be most appropriate for this patient at this time.  All issues noted in this document were discussed and addressed.  No physical exam was performed (except for noted visual exam findings with Video Visits).  I discussed the limitations of evaluation and management by telemedicine and the availability of in person appointments. The patient expressed understanding and agreed to proceed.  I connected withNAME@ on 06/07/22 at  1:10 PM EDT by a video enabled telemedicine application and verified that I am speaking with the correct person using two identifiers.  Present at visit: Jordan Coombe, DO [name]   Patient Location: Home [address] [place] [address]   Provider location:   [facility]  No chief complaint on file.   HPI  Jordan Bailey is a 56 y.o. male who presents via audio/video conferencing for a telehealth visit today.  He has concerns about possible OSA.  Feels that he is sill tired when waking up in the morning which persists throughout the day.  He has been told that he snores while sleeping.  He denies morning headaches.     ROS:  A comprehensive ROS was completed and negative except as noted per HPI  Past Medical History:  Diagnosis Date   Arthritis    History of kidney stones    Iron deficiency anemia     Past Surgical History:  Procedure Laterality Date   APPLICATION OF A-CELL OF BACK N/A 10/07/2015   Procedure: APPLICATION OF ACELL AND VAC DRESSING TO BACK WOUND;  Surgeon: Peggye Form, DO;  Location: [facility];  Service: Plastics;  Laterality: N/A;   COLONOSCOPY     COSMETIC SURGERY     360 degree he had a 360 plastic surgery operation last week where excess skin was removed around his trunkhe had a  360 plastic surgery operation  excess skin was removed around his trunk   INCISION AND DRAINAGE OF WOUND N/A 10/07/2015   Procedure: DEBRIDEMENT OF BACK WOUND;  Surgeon: Peggye Form, DO;  Location: [facility];  Service: Plastics;  Laterality: N/A;   JOINT REPLACEMENT Bilateral    knees   LAPAROSCOPIC GASTRIC SLEEVE RESECTION  05/21/2013   REATTACHED TENDON Left 05/2011   LEFT LEG   TOTAL HIP ARTHROPLASTY Left 08/28/2017   Procedure: TOTAL HIP ARTHROPLASTY ANTERIOR APPROACH;  Surgeon: Jodi Geralds, MD;  Location: [facility];  Service: Orthopedics;  Laterality: Left;   TOTAL HIP ARTHROPLASTY     right Dr. Luiz Blare 12-08-17   TOTAL HIP ARTHROPLASTY Right 12/08/2017   Procedure: RIGHT TOTAL HIP ARTHROPLASTY ANTERIOR;  Surgeon: Jodi Geralds, MD;  Location: [facility];  Service: Orthopedics;  Laterality: Right;   TOTAL KNEE ARTHROPLASTY Left 03/06/2015   Procedure: TOTAL KNEE ARTHROPLASTY;  Surgeon: Jodi Geralds, MD;  Location: [facility];  Service: Orthopedics;  Laterality: Left;   TOTAL KNEE ARTHROPLASTY Right 06/22/2015   Procedure: RIGHT TOTAL KNEE ARTHROPLASTY;  Surgeon: Jodi Geralds, MD;  Location: [facility];  Service: Orthopedics;  Laterality: Right;    Family History  Problem Relation Age of Onset   Diabetes Mother     Social History   Socioeconomic History   Marital status: Divorced    Spouse name: Not on file   Number of children: 2  Years of education: 10   Highest education level: Associate degree: occupational, Scientist, product/process development, or vocational program  Occupational History   Occupation: disabled    Comment: truck driver  Tobacco Use   Smoking status: Light Smoker    Types: Cigars   Smokeless tobacco: Never  Vaping Use   Vaping Use: Never used  Substance and Sexual Activity   Alcohol use: No    Alcohol/week: 0.0 standard drinks of alcohol   Drug use: No   Sexual activity: Yes    Partners: Female  Other Topics Concern   Not on file  Social History Narrative   No caffeine  during the day. 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    Social Determinants of Health   Financial Resource Strain: Low Risk  (12/09/2020)   Overall Financial Resource Strain (CARDIA)    Difficulty of Paying Living Expenses: Not hard at all  Food Insecurity: No Food Insecurity (12/09/2020)   Hunger Vital Sign    Worried About Running Out of Food in the Last Year: Never true    Ran Out of Food in the Last Year: Never true  Transportation Needs: No Transportation Needs (12/09/2020)   PRAPARE - Administrator, Civil Service (Medical): No    Lack of Transportation (Non-Medical): No  Physical Activity: Inactive (12/09/2020)   Exercise Vital Sign    Days of Exercise per Week: 0 days    Minutes of Exercise per Session: 0 min  Stress: No Stress Concern Present (08/01/2018)   [employer] of Occupational Health - Occupational Stress Questionnaire    Feeling of Stress : Not at all  Social Connections: Socially Isolated (12/09/2020)   Social Connection and Isolation Panel [NHANES]    Frequency of Communication with Friends and Family: More than three times a week    Frequency of Social Gatherings with Friends and Family: More than three times a week    Attends Religious Services: Never    Database administrator or Organizations: No    Attends Banker Meetings: Never    Marital Status: Divorced  Catering manager Violence: Not At Risk (12/09/2020)   Humiliation, Afraid, Rape, and Kick questionnaire    Fear of Current or Ex-Partner: No    Emotionally Abused: No    Physically Abused: No    Sexually Abused: No     Current Outpatient Medications:    BIOTIN PO, Take 1 tablet by mouth daily., Disp: , Rfl:    Cyanocobalamin (B-12 DOTS PO), Take 3 each by mouth every other day., Disp: , Rfl:    docusate sodium (COLACE) 100 MG capsule, Take 1 capsule (100 mg total) by mouth 2 (two) times daily., Disp: 30 capsule, Rfl: 0   ferrous sulfate 325 (65 FE) MG tablet, Take 1 tablet (325  mg total) by mouth 3 (three) times daily with meals., Disp: 270 tablet, Rfl: 3   gabapentin (NEURONTIN) 300 MG capsule, One tab PO qHS for a week, then BID for a week, then TID. May double weekly to a max of 3,600mg /day, Disp: 90 capsule, Rfl: 3   Multiple Vitamins-Minerals (MULTIVITAMIN WITH MINERALS) tablet, Take 1 tablet by mouth daily., Disp: , Rfl:    terbinafine (LAMISIL) 250 MG tablet, TAKE 1 TABLET BY MOUTH DAILY. FOR TOENAIL FUNGUS. WILL NEED LIVER LAB CHECK AFTER 3 MONTHS, Disp: 30 tablet, Rfl: 0   traMADol (ULTRAM) 50 MG tablet, , Disp: , Rfl:    meloxicam (MOBIC) 15 MG tablet, Take by mouth., Disp: ,  Rfl:   EXAM:  VITALS per patient if applicable: Ht 5\' 11"  (1.803 m)   Wt 260 lb (117.9 kg)   BMI 36.26 kg/m   GENERAL: alert, oriented, appears well and in no acute distress  HEENT: atraumatic, conjunttiva clear, no obvious abnormalities on inspection of external nose and ears  NECK: normal movements of the head and neck  LUNGS: on inspection no signs of respiratory distress, breathing rate appears normal, no obvious gross SOB, gasping or wheezing  CV: no obvious cyanosis  MS: moves all visible extremities without noticeable abnormality  PSYCH/NEURO: pleasant and cooperative, no obvious depression or anxiety, speech and thought processing grossly intact  ASSESSMENT AND PLAN:  Discussed the following assessment and plan:  Daytime somnolence STOP-BANG-6.  Symptoms concerning for OSA. Referral for sleep study ordered.      I discussed the assessment and treatment plan with the patient. The patient was provided an opportunity to ask questions and all were answered. The patient agreed with the plan and demonstrated an understanding of the instructions.   The patient was advised to call back or seek an in-person evaluation if the symptoms worsen or if the condition fails to improve as anticipated.    , DO

## 2022-06-07 NOTE — Assessment & Plan Note (Signed)
STOP-BANG-6.  Symptoms concerning for OSA. Referral for sleep study ordered.

## 2022-06-14 ENCOUNTER — Other Ambulatory Visit: Payer: Self-pay

## 2022-06-14 DIAGNOSIS — Z1211 Encounter for screening for malignant neoplasm of colon: Secondary | ICD-10-CM

## 2022-06-26 DIAGNOSIS — Z1211 Encounter for screening for malignant neoplasm of colon: Secondary | ICD-10-CM | POA: Diagnosis not present

## 2022-07-01 ENCOUNTER — Telehealth: Payer: Self-pay

## 2022-07-01 NOTE — Telephone Encounter (Signed)
If this is the gabapentin then he can continue to increase it per the directions on the bottle.

## 2022-07-01 NOTE — Telephone Encounter (Signed)
Patient called to report that he is taking the new medication twice daily and is still feeling the tingling in his hands. Can he continue to increase the dose or does he need to be on something different? Please advise.

## 2022-07-05 NOTE — Telephone Encounter (Signed)
Patient was referring to the gabapentin. He did not read the bottle. Advised patient to read the bottle and increase doses accordingly. He will call back with any additional questions.

## 2022-07-07 LAB — COLOGUARD: COLOGUARD: NEGATIVE

## 2022-09-21 ENCOUNTER — Other Ambulatory Visit: Payer: Self-pay | Admitting: Sports Medicine

## 2022-09-21 DIAGNOSIS — G5603 Carpal tunnel syndrome, bilateral upper limbs: Secondary | ICD-10-CM

## 2022-09-29 ENCOUNTER — Telehealth: Payer: Self-pay

## 2022-09-29 DIAGNOSIS — G5603 Carpal tunnel syndrome, bilateral upper limbs: Secondary | ICD-10-CM

## 2022-09-29 NOTE — Telephone Encounter (Signed)
Patient left VM and states has carpal tunnel surgery on the 23rd and would like a refill on the gabapentin until then cause he states he can't sleep please advise

## 2022-09-30 MED ORDER — GABAPENTIN 300 MG PO CAPS
300.0000 mg | ORAL_CAPSULE | Freq: Three times a day (TID) | ORAL | 3 refills | Status: DC
Start: 2022-09-30 — End: 2023-01-22

## 2022-09-30 NOTE — Addendum Note (Signed)
Addended by: Monica Becton on: 09/30/2022 09:38 AM   Modules accepted: Orders

## 2022-09-30 NOTE — Telephone Encounter (Signed)
No problem, refilled now.

## 2022-11-30 ENCOUNTER — Ambulatory Visit (INDEPENDENT_AMBULATORY_CARE_PROVIDER_SITE_OTHER): Payer: 59

## 2022-11-30 DIAGNOSIS — M76829 Posterior tibial tendinitis, unspecified leg: Secondary | ICD-10-CM

## 2022-11-30 NOTE — Progress Notes (Signed)
Patient presents today to be casted for custom molded orthotics. Jacqualyn Posey is the treating physician.  Impression foam cast was taken. ABN signed.  Patient info-  Shoe size: 49M  Shoe style: ATHLETIC  Height: 5FT 10IN  Weight: 250 LBS  Insurance: [place]   Patient will be notified once orthotics arrive in office and reappoint for fitting at that time.

## 2022-12-14 ENCOUNTER — Telehealth: Payer: Self-pay

## 2022-12-14 NOTE — Telephone Encounter (Signed)
Patient left a VM and states since he has been going to the gym and his shoulder has been grinding and popping every time he works out and he wants to know how long is that suppose to go on.

## 2022-12-16 ENCOUNTER — Telehealth: Payer: Self-pay

## 2022-12-16 DIAGNOSIS — L600 Ingrowing nail: Secondary | ICD-10-CM

## 2022-12-16 NOTE — Telephone Encounter (Signed)
He had been discussing his shoulder recently with Dr. Norwood Levo, I wonder if this message was supposed to go to him instead of me.  We have not talked about his shoulder in a year.

## 2022-12-16 NOTE — Telephone Encounter (Signed)
Referral to podiatry for ingrown toenails.

## 2022-12-22 ENCOUNTER — Ambulatory Visit: Payer: 59 | Admitting: Podiatry

## 2022-12-22 ENCOUNTER — Encounter: Payer: Self-pay | Admitting: Podiatry

## 2022-12-22 DIAGNOSIS — M76829 Posterior tibial tendinitis, unspecified leg: Secondary | ICD-10-CM | POA: Diagnosis not present

## 2022-12-22 DIAGNOSIS — L600 Ingrowing nail: Secondary | ICD-10-CM | POA: Diagnosis not present

## 2022-12-22 NOTE — Patient Instructions (Signed)

## 2022-12-22 NOTE — Progress Notes (Signed)
Subjective:   Patient ID: Jordan Bailey, male   DOB: 57 y.o.   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  I allowed him to read then signed consent form I infiltrated each big toe 60 mg like Marcaine mixture sterile prep done using sterile instrumentation remove the medial borders exposed matrix applied chemical 3 applications 30 seconds followed by alcohol lavage sterile dressing gave instructions on soaks and to wear dressings 24 hours take them off earlier if throbbing were to occur.  Discussed posterior tibial tendinitis has been casted for orthotics but I do think he may do better with a new AFO brace and we will work on that for him and decide what would be most appropriate

## 2022-12-29 ENCOUNTER — Other Ambulatory Visit: Payer: Self-pay | Admitting: Sports Medicine

## 2023-01-03 ENCOUNTER — Other Ambulatory Visit: Payer: Self-pay

## 2023-01-03 DIAGNOSIS — D508 Other iron deficiency anemias: Secondary | ICD-10-CM

## 2023-01-03 MED ORDER — FERROUS SULFATE 325 (65 FE) MG PO TABS: 325.0000 mg | ORAL_TABLET | Freq: Three times a day (TID) | ORAL | 0 refills | Status: AC

## 2023-01-03 NOTE — Progress Notes (Signed)
Pls contact the patient to schedule appt with PCP. Sending 30 day medication refill.

## 2023-01-06 ENCOUNTER — Telehealth: Payer: Self-pay | Admitting: Podiatry

## 2023-01-06 NOTE — Telephone Encounter (Signed)
Patient was calling to check on the status of his orthotics

## 2023-01-19 ENCOUNTER — Encounter: Payer: Self-pay | Admitting: Family Medicine

## 2023-01-19 ENCOUNTER — Ambulatory Visit (INDEPENDENT_AMBULATORY_CARE_PROVIDER_SITE_OTHER): Payer: 59 | Admitting: Family Medicine

## 2023-01-19 VITALS — BP 111/71 | HR 66 | Ht 71.0 in | Wt 258.0 lb

## 2023-01-19 DIAGNOSIS — E538 Deficiency of other specified B group vitamins: Secondary | ICD-10-CM

## 2023-01-19 DIAGNOSIS — S76112S Strain of left quadriceps muscle, fascia and tendon, sequela: Secondary | ICD-10-CM | POA: Diagnosis not present

## 2023-01-19 DIAGNOSIS — Z9884 Bariatric surgery status: Secondary | ICD-10-CM

## 2023-01-19 DIAGNOSIS — D508 Other iron deficiency anemias: Secondary | ICD-10-CM | POA: Diagnosis not present

## 2023-01-19 DIAGNOSIS — S9301XA Subluxation of right ankle joint, initial encounter: Secondary | ICD-10-CM

## 2023-01-19 DIAGNOSIS — E559 Vitamin D deficiency, unspecified: Secondary | ICD-10-CM

## 2023-01-19 DIAGNOSIS — Z13220 Encounter for screening for lipoid disorders: Secondary | ICD-10-CM

## 2023-01-20 DIAGNOSIS — E669 Obesity, unspecified: Secondary | ICD-10-CM | POA: Diagnosis not present

## 2023-01-20 DIAGNOSIS — E538 Deficiency of other specified B group vitamins: Secondary | ICD-10-CM | POA: Diagnosis not present

## 2023-01-20 DIAGNOSIS — Z13220 Encounter for screening for lipoid disorders: Secondary | ICD-10-CM | POA: Diagnosis not present

## 2023-01-20 DIAGNOSIS — D508 Other iron deficiency anemias: Secondary | ICD-10-CM | POA: Diagnosis not present

## 2023-01-20 DIAGNOSIS — E559 Vitamin D deficiency, unspecified: Secondary | ICD-10-CM | POA: Diagnosis not present

## 2023-01-21 LAB — CBC WITH DIFFERENTIAL/PLATELET
Absolute Monocytes: 505 cells/uL (ref 200–950)
Basophils Absolute: 20 cells/uL (ref 0–200)
Basophils Relative: 0.4 %
Eosinophils Absolute: 60 cells/uL (ref 15–500)
Eosinophils Relative: 1.2 %
HCT: 40.4 % (ref 38.5–50.0)
Hemoglobin: 12.9 g/dL — ABNORMAL LOW (ref 13.2–17.1)
Lymphs Abs: 2810 cells/uL (ref 850–3900)
MCH: 24.6 pg — ABNORMAL LOW (ref 27.0–33.0)
MCHC: 31.9 g/dL — ABNORMAL LOW (ref 32.0–36.0)
MCV: 77 fL — ABNORMAL LOW (ref 80.0–100.0)
MPV: 9.6 fL (ref 7.5–12.5)
Monocytes Relative: 10.1 %
Neutro Abs: 1605 cells/uL (ref 1500–7800)
Neutrophils Relative %: 32.1 %
Platelets: 231 10*3/uL (ref 140–400)
RBC: 5.25 10*6/uL (ref 4.20–5.80)
RDW: 15.3 % — ABNORMAL HIGH (ref 11.0–15.0)
Total Lymphocyte: 56.2 %
WBC: 5 10*3/uL (ref 3.8–10.8)

## 2023-01-21 LAB — COMPLETE METABOLIC PANEL WITH GFR
AG Ratio: 1.4 (calc) (ref 1.0–2.5)
ALT: 4 U/L — ABNORMAL LOW (ref 9–46)
AST: 13 U/L (ref 10–35)
Albumin: 3.9 g/dL (ref 3.6–5.1)
Alkaline phosphatase (APISO): 63 U/L (ref 35–144)
BUN: 15 mg/dL (ref 7–25)
CO2: 27 mmol/L (ref 20–32)
Calcium: 9.3 mg/dL (ref 8.6–10.3)
Chloride: 105 mmol/L (ref 98–110)
Creat: 0.98 mg/dL (ref 0.70–1.30)
Globulin: 2.7 g/dL (calc) (ref 1.9–3.7)
Glucose, Bld: 85 mg/dL (ref 65–99)
Potassium: 4.6 mmol/L (ref 3.5–5.3)
Sodium: 140 mmol/L (ref 135–146)
Total Bilirubin: 0.6 mg/dL (ref 0.2–1.2)
Total Protein: 6.6 g/dL (ref 6.1–8.1)
eGFR: 91 mL/min/{1.73_m2} (ref >=60)

## 2023-01-21 LAB — LIPID PANEL W/REFLEX DIRECT LDL
Cholesterol: 159 mg/dL (ref <200)
HDL: 54 mg/dL (ref >=40)
LDL Cholesterol (Calc): 89 mg/dL (calc)
Non-HDL Cholesterol (Calc): 105 mg/dL (calc) (ref <130)
Total CHOL/HDL Ratio: 2.9 (calc) (ref <5.0)
Triglycerides: 70 mg/dL (ref <150)

## 2023-01-21 LAB — VITAMIN D 25 HYDROXY (VIT D DEFICIENCY, FRACTURES): Vit D, 25-Hydroxy: 23 ng/mL — ABNORMAL LOW (ref 30–100)

## 2023-01-21 LAB — IRON,TIBC AND FERRITIN PANEL
%SAT: 39 % (calc) (ref 20–48)
Ferritin: 200 ng/mL (ref 38–380)
Iron: 99 ug/dL (ref 50–180)
TIBC: 255 mcg/dL (calc) (ref 250–425)

## 2023-01-21 LAB — B12 AND FOLATE PANEL
Folate: 5.3 ng/mL — ABNORMAL LOW
Vitamin B-12: 271 pg/mL (ref 200–1100)

## 2023-01-22 NOTE — Assessment & Plan Note (Signed)
Currently on disability due to multiple orthopedic injuries in the past.  He reports that he is being reevaluated for this.  Sending for functional capacity evaluation

## 2023-01-22 NOTE — Assessment & Plan Note (Signed)
Recheck CBC and iron panel

## 2023-01-22 NOTE — Assessment & Plan Note (Signed)
Obtain labs.

## 2023-01-22 NOTE — Progress Notes (Signed)
Jordan Bailey - 57 y.o. male MRN [id number]  Date of birth: Sep 21, 1966  Subjective Chief Complaint  Patient presents with   Medication Refill    HPI Jordan Bailey is a 57 year old male here today for follow-up visit.  He reports he is doing okay at this time.  He has had multiple orthopedic injuries and surgeries remains on disability.  History of bariatric surgery.  Continues on ferrous sulfate for history of iron deficiency and anemia.  He is due for updated labs.  ROS:  A comprehensive ROS was completed and negative except as noted per HPI   Past Medical History:  Diagnosis Date   Arthritis    History of kidney stones    Iron deficiency anemia     Past Surgical History:  Procedure Laterality Date   APPLICATION OF A-CELL OF BACK N/A 10/07/2015   Procedure: APPLICATION OF ACELL AND VAC DRESSING TO BACK WOUND;  Surgeon: Peggye Form, DO;  Location: [facility];  Service: Plastics;  Laterality: N/A;   COLONOSCOPY     COSMETIC SURGERY     360 degree he had a 360 plastic surgery operation last week where excess skin was removed around his trunkhe had a 360 plastic surgery operation  excess skin was removed around his trunk   INCISION AND DRAINAGE OF WOUND N/A 10/07/2015   Procedure: DEBRIDEMENT OF BACK WOUND;  Surgeon: Peggye Form, DO;  Location: [facility];  Service: Plastics;  Laterality: N/A;   JOINT REPLACEMENT Bilateral    knees   LAPAROSCOPIC GASTRIC SLEEVE RESECTION  05/21/2013   REATTACHED TENDON Left 05/2011   LEFT LEG   TOTAL HIP ARTHROPLASTY Left 08/28/2017   Procedure: TOTAL HIP ARTHROPLASTY ANTERIOR APPROACH;  Surgeon: Jodi Geralds, MD;  Location: [facility];  Service: Orthopedics;  Laterality: Left;   TOTAL HIP ARTHROPLASTY     right Dr. Luiz Blare 12-08-17   TOTAL HIP ARTHROPLASTY Right 12/08/2017   Procedure: RIGHT TOTAL HIP ARTHROPLASTY ANTERIOR;  Surgeon: Jodi Geralds, MD;  Location: [facility];  Service: Orthopedics;  Laterality: Right;    TOTAL KNEE ARTHROPLASTY Left 03/06/2015   Procedure: TOTAL KNEE ARTHROPLASTY;  Surgeon: Jodi Geralds, MD;  Location: [facility];  Service: Orthopedics;  Laterality: Left;   TOTAL KNEE ARTHROPLASTY Right 06/22/2015   Procedure: RIGHT TOTAL KNEE ARTHROPLASTY;  Surgeon: Jodi Geralds, MD;  Location: [facility];  Service: Orthopedics;  Laterality: Right;    Social History   Socioeconomic History   Marital status: Divorced    Spouse name: Not on file   Number of children: 2   Years of education: 12   Highest education level: Associate degree: occupational, Scientist, product/process development, or vocational program  Occupational History   Occupation: disabled    Comment: truck driver  Tobacco Use   Smoking status: Light Smoker    Types: Cigars   Smokeless tobacco: Never  Vaping Use   Vaping Use: Never used  Substance and Sexual Activity   Alcohol use: No    Alcohol/week: 0.0 standard drinks of alcohol   Drug use: No   Sexual activity: Yes    Partners: Female  Other Topics Concern   Not on file  Social History Narrative   No caffeine during the day. 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    Social Determinants of Health   Financial Resource Strain: Patient Declined (01/17/2023)   Overall Financial Resource Strain (CARDIA)    Difficulty of Paying Living Expenses: Patient declined  Food Insecurity: Patient Declined (01/17/2023)  Hunger Vital Sign    Worried About Running Out of Food in the Last Year: Patient declined    Ran Out of Food in the Last Year: Patient declined  Transportation Needs: Patient Declined (01/17/2023)   PRAPARE - Administrator, Civil Service (Medical): Patient declined    Lack of Transportation (Non-Medical): Patient declined  Physical Activity: Unknown (01/17/2023)   Exercise Vital Sign    Days of Exercise per Week: Patient declined    Minutes of Exercise per Session: Not on file  Stress: Patient Declined (01/17/2023)   [employer] of Occupational Health - Occupational Stress  Questionnaire    Feeling of Stress : Patient declined  Social Connections: Unknown (01/17/2023)   Social Connection and Isolation Panel [NHANES]    Frequency of Communication with Friends and Family: Patient declined    Frequency of Social Gatherings with Friends and Family: Patient declined    Attends Religious Services: Patient declined    Database administrator or Organizations: No    Attends Engineer, structural: Not on file    Marital Status: Divorced    Family History  Problem Relation Age of Onset   Diabetes Mother     Health Maintenance  Topic Date Due   Hepatitis C Screening  Never done   COVID-19 Vaccine (1) 07/21/2023 (Originally 04/03/1966)   Medicare Annual Wellness (AWV)  07/21/2023 (Originally 12/09/2021)   Zoster Vaccines- Shingrix (1 of 2) 07/21/2023 (Originally 10/03/2016)   INFLUENZA VACCINE  05/04/2023   Fecal DNA (Cologuard)  06/26/2025   DTaP/Tdap/Td (2 - Td or Tdap) 04/30/2028   HIV Screening  Completed   HPV VACCINES  Aged Out     ----------------------------------------------------------------------------------------------------------------------------------------------------------------------------------------------------------------- Physical Exam BP 111/71 (BP Location: Left Arm, Patient Position: Sitting, Cuff Size: Large)   Pulse 66   Ht  (1.803 m)   Wt 258 lb (117 kg)   SpO2 99%   BMI 35.98 kg/m   Physical Exam Constitutional:      Appearance: Normal appearance.  HENT:     Head: Normocephalic and atraumatic.  Eyes:     General: No scleral icterus. Cardiovascular:     Rate and Rhythm: Normal rate and regular rhythm.  Pulmonary:     Effort: Pulmonary effort is normal.     Breath sounds: Normal breath sounds.  Musculoskeletal:     Cervical back: Neck supple.  Neurological:     Mental Status: He is alert.  Psychiatric:        Mood and Affect: Mood normal.        Behavior: Behavior normal.      ------------------------------------------------------------------------------------------------------------------------------------------------------------------------------------------------------------------- Assessment and Plan  Bariatric surgery status Obtain labs.  Anemia Recheck CBC and iron panel.  Acquired subluxation of right ankle Currently on disability due to multiple orthopedic injuries in the past.  He reports that he is being reevaluated for this.  Sending for functional capacity evaluation   No orders of the defined types were placed in this encounter.   No follow-ups on file.    This visit occurred during the SARS-CoV-2 public health emergency.  Safety protocols were in place, including screening questions prior to the visit, additional usage of staff PPE, and extensive cleaning of exam room while observing appropriate contact time as indicated for disinfecting solutions.  Does not hurt like he felt he got gas like it is not loose like soft commands like that to go via started to wonder I got some medicine from Abby, I looked at her  hand the other day her sugar I will increase that either try to get some sleep that try

## 2023-01-25 ENCOUNTER — Telehealth: Payer: Self-pay | Admitting: Family Medicine

## 2023-01-25 NOTE — Telephone Encounter (Signed)
Contacted Jordan Bailey to schedule their annual wellness visit. Patient declined to schedule AWV at this time.  Cira Servant Patient Engineer, production II Direct Dial: [phone]

## 2023-01-26 ENCOUNTER — Ambulatory Visit (INDEPENDENT_AMBULATORY_CARE_PROVIDER_SITE_OTHER): Payer: 59 | Admitting: Podiatry

## 2023-01-26 ENCOUNTER — Encounter: Payer: Self-pay | Admitting: Podiatry

## 2023-01-26 DIAGNOSIS — L600 Ingrowing nail: Secondary | ICD-10-CM

## 2023-01-26 MED ORDER — CEPHALEXIN 500 MG PO CAPS: 500.0000 mg | ORAL_CAPSULE | Freq: Four times a day (QID) | ORAL | 0 refills | Status: DC

## 2023-01-26 MED ORDER — CEPHALEXIN 500 MG PO CAPS: 500.0000 mg | ORAL_CAPSULE | Freq: Four times a day (QID) | ORAL | 0 refills | Status: AC

## 2023-01-26 NOTE — Progress Notes (Signed)
  Subjective:  Patient ID: Jordan Bailey, male    DOB: 11-05-1965,   MRN: [id number]  Chief Complaint  Patient presents with   Ingrown Toenail    Patient came in today for bilateral ingrown follow-up, patient states that the hallux are still sore and left is possible infected,     57 y.o. male presents for concern of left great toe ingrown. Relates he had the ingrown removed about 5 weeks ago with Dr. Charlsie Merles. Relates the right toe has been doing fine but the left one has been dark and tender to the touch. Stopped soaking about a week after the procedure.  . Denies any other pedal complaints. Denies n/v/f/c.   Past Medical History:  Diagnosis Date   Arthritis    History of kidney stones    Iron deficiency anemia     Objective:  Physical Exam: Vascular: DP/PT pulses 2/4 bilateral. CFT <3 seconds. Normal hair growth on digits. No edema.  Skin. No lacerations or abrasions bilateral feet. Right hallux nail healing well. Left hallux with some edema and erythema noted to medial border. No signs of remaining nail present Musculoskeletal: MMT 5/5 bilateral lower extremities in DF, PF, Inversion and Eversion. Deceased ROM in DF of ankle joint.  Neurological: Sensation intact to light touch.   Assessment:   1. Ingrown nail      Plan:  Patient was evaluated and treated and all questions answered. Toe was evaluated and appears to be healing well on right and possible infection on left.  Keflex sent into pharmacy Continue soaks and neosporin for next week.  Patient to follow-up as needed and discussed if worsens to return to have procedure redone.     Louann Sjogren, DPM

## 2023-02-02 ENCOUNTER — Telehealth: Payer: Self-pay | Admitting: *Deleted

## 2023-02-02 NOTE — Telephone Encounter (Signed)
Patient has been updated with physician's instructions.

## 2023-02-02 NOTE — Telephone Encounter (Signed)
Patient is calling because he picked up another antibiotic on 30 th from pharmacy,was this an error or is he supposed to take a second dosage w/ continuing to soak the toe, has noticed that the toe has been improving a little, please advise.

## 2023-02-03 ENCOUNTER — Other Ambulatory Visit: Payer: Self-pay | Admitting: Family Medicine

## 2023-02-03 MED ORDER — FOLIC ACID 1 MG PO TABS: 1.0000 mg | ORAL_TABLET | Freq: Every day | ORAL | 1 refills | Status: AC

## 2023-03-20 ENCOUNTER — Ambulatory Visit (INDEPENDENT_AMBULATORY_CARE_PROVIDER_SITE_OTHER): Payer: Medicare Other | Admitting: Podiatrist

## 2023-03-20 DIAGNOSIS — M76829 Posterior tibial tendinitis, unspecified leg: Secondary | ICD-10-CM

## 2023-03-20 DIAGNOSIS — M79671 Pain in right foot: Secondary | ICD-10-CM

## 2023-03-20 DIAGNOSIS — M21619 Bunion of unspecified foot: Secondary | ICD-10-CM

## 2023-03-20 NOTE — Progress Notes (Signed)
Patient was scanned today using the [employer] scanner for orthotics.  Patient will be called when orthotics are ready for pick up

## 2023-04-14 LAB — PSA: PSA: 0.28

## 2023-04-14 LAB — HEPATIC FUNCTION PANEL
Alkaline Phosphatase: 81 (ref 25–125)
Bilirubin, Total: 0.5

## 2023-04-14 LAB — COMPREHENSIVE METABOLIC PANEL
Albumin: 4 (ref 3.5–5.0)
Globulin: 2.8
eGFR: 111.98

## 2023-04-14 LAB — LIPID PANEL
Cholesterol: 172 (ref 0–200)
HDL: 59 (ref 35–70)
LDL Cholesterol: 97
LDl/HDL Ratio: 1.6
Triglycerides: 76 (ref 40–160)

## 2023-04-14 LAB — BASIC METABOLIC PANEL
BUN: 13 (ref 4–21)
Creatinine: 0.9 (ref 0.6–1.3)
Glucose: 79

## 2023-04-14 LAB — PROTEIN / CREATININE RATIO, URINE: Creatinine, Urine: 161

## 2023-04-14 LAB — HEMOGLOBIN A1C: Hemoglobin A1C: 5.3

## 2023-04-18 LAB — LAB REPORT - SCANNED
A1c: 5.3
Creatinine, POC: 161 mg/dL

## 2023-04-20 ENCOUNTER — Telehealth: Payer: Self-pay | Admitting: Family Medicine

## 2023-04-20 NOTE — Telephone Encounter (Signed)
Patient dropped off some blood work results for Dr. Ashley Royalty to see. Paper work is in Dr. Ashley Royalty box.

## 2023-04-26 ENCOUNTER — Other Ambulatory Visit: Payer: Self-pay | Admitting: Podiatry

## 2023-04-26 ENCOUNTER — Ambulatory Visit (INDEPENDENT_AMBULATORY_CARE_PROVIDER_SITE_OTHER): Payer: Medicare Other | Admitting: Podiatry

## 2023-04-26 DIAGNOSIS — M76829 Posterior tibial tendinitis, unspecified leg: Secondary | ICD-10-CM

## 2023-04-26 DIAGNOSIS — M21619 Bunion of unspecified foot: Secondary | ICD-10-CM

## 2023-04-26 DIAGNOSIS — M76822 Posterior tibial tendinitis, left leg: Secondary | ICD-10-CM

## 2023-04-26 DIAGNOSIS — M79671 Pain in right foot: Secondary | ICD-10-CM

## 2023-04-26 DIAGNOSIS — M76821 Posterior tibial tendinitis, right leg: Secondary | ICD-10-CM | POA: Diagnosis not present

## 2023-04-26 NOTE — Progress Notes (Signed)
Patient presented today to be rescanned for orthotics,  orders showed in [facility] but the scans were blank .    Patient is interested in getting a new brace if order can be put in for [facility] the one he has is over 57 years old.

## 2023-05-04 ENCOUNTER — Ambulatory Visit (INDEPENDENT_AMBULATORY_CARE_PROVIDER_SITE_OTHER): Payer: Medicare Other

## 2023-05-04 DIAGNOSIS — M21611 Bunion of right foot: Secondary | ICD-10-CM | POA: Diagnosis not present

## 2023-05-04 DIAGNOSIS — M79671 Pain in right foot: Secondary | ICD-10-CM

## 2023-05-04 DIAGNOSIS — M76829 Posterior tibial tendinitis, unspecified leg: Secondary | ICD-10-CM

## 2023-05-04 DIAGNOSIS — M76822 Posterior tibial tendinitis, left leg: Secondary | ICD-10-CM | POA: Diagnosis not present

## 2023-05-04 DIAGNOSIS — M21619 Bunion of unspecified foot: Secondary | ICD-10-CM

## 2023-05-04 NOTE — Progress Notes (Signed)
Patient presents today to pick up custom molded foot orthotics, diagnosed with PTTD and Bunions by Dr. Ralene Cork.   Orthotics were dispensed and fit was satisfactory. Patient wears AFO on Right LE, I put orthotic inside right AFO patient states feels ok will wear and try also will be coming back for new AFO once I receive my casting supplies. Reviewed instructions for break-in and wear. Written instructions given to patient.  Patient will follow up as needed.   Jordan Bailey CPed, CFo, CFm

## 2023-06-13 ENCOUNTER — Ambulatory Visit (INDEPENDENT_AMBULATORY_CARE_PROVIDER_SITE_OTHER): Payer: Medicare Other | Admitting: Sports Medicine

## 2023-06-13 DIAGNOSIS — S8011XA Contusion of right lower leg, initial encounter: Secondary | ICD-10-CM | POA: Diagnosis not present

## 2023-06-13 NOTE — Assessment & Plan Note (Addendum)
Pleasant 57 year old male, he does wear a right sided AFO, he fell into a hole approximately 5 or 6 days ago, he then had some bruising and pain anterior right lower leg. It is minimally painful, on exam he does have a significantly swollen right lower extremity, swelling is localized anterolaterally. He has a negative Homans' sign, good motion, neurovascularly intact distally. I did an unofficial office ultrasound that showed an organized hematoma that was subcutaneous but superficial to the muscle fascia. It did not appear to be fluid so no aspiration was attempted. I have advised compression, massage and heat as well as patience.

## 2023-06-13 NOTE — Progress Notes (Signed)
    Procedures performed today:    None.  Independent interpretation of notes and tests performed by another provider:   None.  Brief History, Exam, Impression, and Recommendations:    Hematoma of right lower leg Pleasant 57 year old male, he does wear a right sided AFO, he fell into a hole approximately 5 or 6 days ago, he then had some bruising and pain anterior right lower leg. It is minimally painful, on exam he does have a significantly swollen right lower extremity, swelling is localized anterolaterally. He has a negative Homans' sign, good motion, neurovascularly intact distally. I did an unofficial office ultrasound that showed an organized hematoma that was subcutaneous but superficial to the muscle fascia. It did not appear to be fluid so no aspiration was attempted. I have advised compression, massage and heat as well as patience.    ____________________________________________ Ihor Austin. Benjamin Stain, M.D., ABFM., CAQSM., AME. Primary Care and Sports Medicine [facility]  Adjunct Professor of Family Medicine  [employer]  Restaurant manager, fast food

## 2023-07-10 ENCOUNTER — Encounter: Payer: Self-pay | Admitting: Family Medicine

## 2023-07-10 ENCOUNTER — Ambulatory Visit (INDEPENDENT_AMBULATORY_CARE_PROVIDER_SITE_OTHER): Payer: Medicare Other | Admitting: Family Medicine

## 2023-07-10 VITALS — BP 123/78 | HR 74 | Ht 71.0 in | Wt 260.0 lb

## 2023-07-10 DIAGNOSIS — S8011XA Contusion of right lower leg, initial encounter: Secondary | ICD-10-CM

## 2023-07-10 MED ORDER — FUROSEMIDE 20 MG PO TABS: ORAL_TABLET | ORAL | 1 refills | Status: AC

## 2023-07-10 NOTE — Assessment & Plan Note (Signed)
No red flags.  Has gotten smaller since initial injury.  No pain or tenderness at this time.  We discussed giving additional time to fully resolve.  Continues heat.  He does have some generalized edema and will add short course of Lasix to help with this.  Encouraged compression socks.

## 2023-07-10 NOTE — Progress Notes (Signed)
2 Jordan Bailey - 57 y.o. male MRN [id number]  Date of birth: 1966/03/27  Subjective Chief Complaint  Patient presents with   Leg Injury    HPI Jordan Bailey is a 57 year old male here today with complaint swelling of the right lower extremity.  He fell into a hole about a month ago.  This resulted in bruising and a a hematoma of the right lower extremity.  This has gotten better since initial injury however has not fully resolved.  He denies any pain at this time.  He has been using some heat.  He does have venous insufficiency at baseline.  ROS:  A comprehensive ROS was completed and negative except as noted per HPI  No Known Allergies  Past Medical History:  Diagnosis Date   Arthritis    History of kidney stones    Iron deficiency anemia     Past Surgical History:  Procedure Laterality Date   APPLICATION OF A-CELL OF BACK N/A 10/07/2015   Procedure: APPLICATION OF ACELL AND VAC DRESSING TO BACK WOUND;  Surgeon: Peggye Form, DO;  Location: [facility];  Service: Plastics;  Laterality: N/A;   COLONOSCOPY     COSMETIC SURGERY     360 degree he had a 360 plastic surgery operation last week where excess skin was removed around his trunkhe had a 360 plastic surgery operation  excess skin was removed around his trunk   INCISION AND DRAINAGE OF WOUND N/A 10/07/2015   Procedure: DEBRIDEMENT OF BACK WOUND;  Surgeon: Peggye Form, DO;  Location: [facility];  Service: Plastics;  Laterality: N/A;   JOINT REPLACEMENT Bilateral    knees   LAPAROSCOPIC GASTRIC SLEEVE RESECTION  05/21/2013   REATTACHED TENDON Left 05/2011   LEFT LEG   TOTAL HIP ARTHROPLASTY Left 08/28/2017   Procedure: TOTAL HIP ARTHROPLASTY ANTERIOR APPROACH;  Surgeon: Jodi Geralds, MD;  Location: [facility];  Service: Orthopedics;  Laterality: Left;   TOTAL HIP ARTHROPLASTY     right Dr. Luiz Blare 12-08-17   TOTAL HIP ARTHROPLASTY Right 12/08/2017   Procedure: RIGHT TOTAL HIP ARTHROPLASTY  ANTERIOR;  Surgeon: Jodi Geralds, MD;  Location: [facility];  Service: Orthopedics;  Laterality: Right;   TOTAL KNEE ARTHROPLASTY Left 03/06/2015   Procedure: TOTAL KNEE ARTHROPLASTY;  Surgeon: Jodi Geralds, MD;  Location: [facility];  Service: Orthopedics;  Laterality: Left;   TOTAL KNEE ARTHROPLASTY Right 06/22/2015   Procedure: RIGHT TOTAL KNEE ARTHROPLASTY;  Surgeon: Jodi Geralds, MD;  Location: [facility];  Service: Orthopedics;  Laterality: Right;    Social History   Socioeconomic History   Marital status: Divorced    Spouse name: Not on file   Number of children: 2   Years of education: 12   Highest education level: Associate degree: occupational, Scientist, product/process development, or vocational program  Occupational History   Occupation: disabled    Comment: truck driver  Tobacco Use   Smoking status: Light Smoker    Types: Cigars   Smokeless tobacco: Never  Vaping Use   Vaping status: Never Used  Substance and Sexual Activity   Alcohol use: No    Alcohol/week: 0.0 standard drinks of alcohol   Drug use: No   Sexual activity: Yes    Partners: Female  Other Topics Concern   Not on file  Social History Narrative   No caffeine during the day. 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    Social Determinants of Health   Financial Resource Strain: Patient Declined (01/17/2023)  Overall Financial Resource Strain (CARDIA)    Difficulty of Paying Living Expenses: Patient declined  Food Insecurity: Patient Declined (01/17/2023)   Hunger Vital Sign    Worried About Running Out of Food in the Last Year: Patient declined    Ran Out of Food in the Last Year: Patient declined  Transportation Needs: Patient Declined (01/17/2023)   PRAPARE - Administrator, Civil Service (Medical): Patient declined    Lack of Transportation (Non-Medical): Patient declined  Physical Activity: Unknown (01/17/2023)   Exercise Vital Sign    Days of Exercise per Week: Patient declined    Minutes of Exercise per Session: Not on file   Stress: Patient Declined (01/17/2023)   [employer] of Occupational Health - Occupational Stress Questionnaire    Feeling of Stress : Patient declined  Social Connections: Unknown (01/17/2023)   Social Connection and Isolation Panel [NHANES]    Frequency of Communication with Friends and Family: Patient declined    Frequency of Social Gatherings with Friends and Family: Patient declined    Attends Religious Services: Patient declined    Database administrator or Organizations: No    Attends Engineer, structural: Not on file    Marital Status: Divorced    Family History  Problem Relation Age of Onset   Diabetes Mother     Health Maintenance  Topic Date Due   Medicare Annual Wellness (AWV)  07/21/2023 (Originally 12/09/2021)   Zoster Vaccines- Shingrix (1 of 2) 07/21/2023 (Originally 10/03/2016)   COVID-19 Vaccine (1 - 2023-24 season) 07/26/2023 (Originally 06/04/2023)   INFLUENZA VACCINE  01/01/2024 (Originally 05/04/2023)   Hepatitis C Screening  07/09/2024 (Originally 10/03/1984)   Fecal DNA (Cologuard)  06/26/2025   DTaP/Tdap/Td (2 - Td or Tdap) 04/30/2028   HIV Screening  Completed   HPV VACCINES  Aged Out     ----------------------------------------------------------------------------------------------------------------------------------------------------------------------------------------------------------------- Physical Exam BP 123/78 (BP Location: Right Arm, Patient Position: Sitting, Cuff Size: Large)   Pulse 74   Ht 5\' 11"  (1.803 m)   Wt 260 lb (117.9 kg)   SpO2 97%   BMI 36.26 kg/m   Physical Exam Constitutional:      Appearance: Normal appearance.  HENT:     Head: Normocephalic and atraumatic.  Cardiovascular:     Rate and Rhythm: Normal rate and regular rhythm.  Pulmonary:     Effort: Pulmonary effort is normal.     Breath sounds: Normal breath sounds.  Skin:    Comments: Nontender hematoma of the right shin.  He does have generalized swelling  of both lower extremities.  Neurological:     General: No focal deficit present.     Mental Status: He is alert.  Psychiatric:        Mood and Affect: Mood normal.        Behavior: Behavior normal.     ------------------------------------------------------------------------------------------------------------------------------------------------------------------------------------------------------------------- Assessment and Plan  Hematoma of right lower leg No red flags.  Has gotten smaller since initial injury.  No pain or tenderness at this time.  We discussed giving additional time to fully resolve.  Continues heat.  He does have some generalized edema and will add short course of Lasix to help with this.  Encouraged compression socks.   Meds ordered this encounter  Medications   furosemide (LASIX) 20 MG tablet    Sig: One by mouth twice a day (six hours apart) as needed for leg swelling or fluid retention.    Dispense:  10 tablet    Refill:  1    No follow-ups on file.    This visit occurred during the SARS-CoV-2 public health emergency.  Safety protocols were in place, including screening questions prior to the visit, additional usage of staff PPE, and extensive cleaning of exam room while observing appropriate contact time as indicated for disinfecting solutions.

## 2023-09-29 ENCOUNTER — Ambulatory Visit: Payer: Medicare Other | Admitting: Podiatry

## 2023-10-03 ENCOUNTER — Encounter: Payer: Self-pay | Admitting: Family Medicine

## 2023-10-11 ENCOUNTER — Telehealth: Payer: Self-pay | Admitting: Podiatry

## 2023-10-11 ENCOUNTER — Ambulatory Visit (INDEPENDENT_AMBULATORY_CARE_PROVIDER_SITE_OTHER): Payer: Medicare Other | Admitting: Podiatry

## 2023-10-11 ENCOUNTER — Encounter: Payer: Self-pay | Admitting: Podiatry

## 2023-10-11 DIAGNOSIS — M2142 Flat foot [pes planus] (acquired), left foot: Secondary | ICD-10-CM

## 2023-10-11 DIAGNOSIS — M76829 Posterior tibial tendinitis, unspecified leg: Secondary | ICD-10-CM

## 2023-10-11 DIAGNOSIS — M2141 Flat foot [pes planus] (acquired), right foot: Secondary | ICD-10-CM | POA: Diagnosis not present

## 2023-10-11 DIAGNOSIS — M2042 Other hammer toe(s) (acquired), left foot: Secondary | ICD-10-CM | POA: Diagnosis not present

## 2023-10-11 NOTE — Telephone Encounter (Signed)
 Pt called and was seen today and is asking for a note for work for him to not be able to stand for long periods of time at least until his orthotics come in. Please advise

## 2023-10-11 NOTE — Progress Notes (Addendum)
 Subjective:   Patient ID: Jordan Bailey, male   DOB: 58 y.o.   MRN: [id number]   HPI Patient presents stating he is having severe collapse medial longitudinal arch bilateral which is getting worse and increasingly make it hard for him to walk.  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  We do think given his history and his discomfort that AFO bracing bilateral is in his best interest and patient is functionally casted for AFO bracing today and this will be sent in to the lab for fabrication.  I did debride lesion courtesy left and applied padding discussed hammertoe correction sometime in future but we will hold off currently.  I also at this time do recommend that he does not stand on his feet for extensive periods of time at work due to the collapse of the arch and the intensity of discomfort.  Patient was made aware of this and we are going to try brace therapy to see what kind of long-term relief we can get for this severe chronic problem with  X-rays indicate collapse medial longitudinal arch bilateral probability for subtalar joint arthritis bilateral

## 2023-10-12 ENCOUNTER — Ambulatory Visit: Payer: Medicare Other | Admitting: Podiatry

## 2023-10-23 ENCOUNTER — Encounter: Payer: Self-pay | Admitting: Podiatry

## 2023-10-23 NOTE — Telephone Encounter (Signed)
That's fine

## 2023-10-23 NOTE — Telephone Encounter (Signed)
Letter written, is aware and pt is to get it out of his my chart account.

## 2023-10-24 NOTE — Telephone Encounter (Signed)
Note complete reflecting change requested

## 2023-10-30 NOTE — Telephone Encounter (Signed)
Needs paper work filled out

## 2023-11-06 ENCOUNTER — Telehealth: Payer: Self-pay | Admitting: Podiatry

## 2023-11-06 NOTE — Progress Notes (Signed)
Patient was casted today for bilateral Custom [place] free motion ankle richie style AFO's  Patient has over pronation of BIL ankles with edema, weakness Old AFO is worn beyond repair  Ites on order will call patient when in to schedule fitting appointment  Addison Bailey

## 2023-11-06 NOTE — Telephone Encounter (Signed)
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 Faxed to [phone] ....    Called patient to advise same ....      J. Abbott -- 11/06/2023

## 2023-11-08 ENCOUNTER — Telehealth: Payer: Self-pay

## 2023-11-08 NOTE — Telephone Encounter (Signed)
 Spoke with Trevor Fudge @ [employer]  Right brace will be made into [place]  gauntlet  As Ankle is not correctable and split upright will most likely cause pressure to medial aspect of ankle

## 2023-11-15 ENCOUNTER — Telehealth: Payer: Self-pay | Admitting: Podiatry

## 2023-11-15 NOTE — Telephone Encounter (Signed)
(  Refer to 02/03 note) ....  Patient called and said that [facility] only honored the accommodation referring to breaks .... Nothing was said about a stool or foot support pad at the patients work station - both of which were on the initial form.  Re-sent the accommodation form to [facility] and asked that these two items be addressed .Marland Kitchen...   Faxed to [phone] and advise same to the patient ....      J. Abbott -- 11/15/2023

## 2023-12-13 ENCOUNTER — Ambulatory Visit (INDEPENDENT_AMBULATORY_CARE_PROVIDER_SITE_OTHER): Payer: Medicare Other

## 2023-12-13 DIAGNOSIS — M2141 Flat foot [pes planus] (acquired), right foot: Secondary | ICD-10-CM

## 2023-12-13 DIAGNOSIS — M21619 Bunion of unspecified foot: Secondary | ICD-10-CM | POA: Diagnosis not present

## 2023-12-13 DIAGNOSIS — M76829 Posterior tibial tendinitis, unspecified leg: Secondary | ICD-10-CM | POA: Diagnosis not present

## 2023-12-13 DIAGNOSIS — M2142 Flat foot [pes planus] (acquired), left foot: Secondary | ICD-10-CM

## 2023-12-13 NOTE — Progress Notes (Signed)
 Patient presents today to pick up custom molded AFO Left richie style and Right solid [place] gauntlet, patient diagnosed with PTTD BIL and Pes Planus deformity BIL    Orthosis' were dispensed and fit was good patient was very happy with function. Brace adds support and stability to ankle and will also help to slow progression of ankle deformity.  Patient reviewed with me instructions for break-in and wear. Written instructions given to patient.  Patient will follow up as needed.   Addison Bailey Cped, CFo, CFm   Patient is considering patient pay shoes wearing 14 XWD right now by [employer] he was given Anodyne and Safe step books

## 2023-12-14 ENCOUNTER — Encounter: Payer: Self-pay | Admitting: Podiatry

## 2023-12-14 ENCOUNTER — Ambulatory Visit (INDEPENDENT_AMBULATORY_CARE_PROVIDER_SITE_OTHER): Admitting: Podiatry

## 2023-12-14 DIAGNOSIS — L600 Ingrowing nail: Secondary | ICD-10-CM

## 2023-12-14 DIAGNOSIS — M76829 Posterior tibial tendinitis, unspecified leg: Secondary | ICD-10-CM

## 2023-12-14 NOTE — Progress Notes (Signed)
 Subjective:   Patient ID: Jordan Bailey, male   DOB: 59 y.o.   MRN: [id number]   HPI Patient concerned about the possibility for ingrown toenail deformity of the hallux stating he is not sure that it may have grown back.  Has not been seen for this for a while   ROS      Objective:  Physical Exam  Neuro vascular status intact with patient found to have history of nail deformity hallux bilateral that is overall healed well with crusted tissue noted no erythema edema no discomfort current     Assessment:  Ingrown toenail deformity chronic hallux bilateral but appears to be healed     Plan:  Reviewed the AFO braces he is wearing their fitted well and helping to reduce the posterior tib dysfunction present with severe flatfoot deformity and do not have concerns currently about nail condition do not recommend treatment

## 2023-12-14 NOTE — Patient Instructions (Signed)

## 2024-01-08 ENCOUNTER — Ambulatory Visit: Payer: Self-pay

## 2024-01-08 NOTE — Telephone Encounter (Signed)
 Chief Complaint: insomnia Symptoms: difficulty falling asleep and staying asleep Frequency: 1 month Pertinent Negatives: Patient denies CP, SOB, hallucinations, SI Disposition: [] ED /[] Urgent Care (no appt availability in office) / [x] Appointment(In office/virtual)/ []  [facility]/ [] Home Care/ [] Refused Recommended Disposition /[] House Mobile Bus/ []  Follow-up with PCP Additional Notes: Pt reports insomnia for 1 month. Pt states he gets off work at 2300 and does not fall asleep until 0100-0200. Then, pt wakes up and is unable to fall back asleep. If he does fall asleep, he states it is a light sleep and he does not feel well-rested. Pt has to wake up at 1100 to be back at work at 1400. Pt has never had a problem like this before and is wondering if he can be prescribed a sleep aid. RN scheduled pt for 1130 virtually tomorrow. Pt agreeable to that plan. RN advised pt if his sleep gets worse, if he has hallucinations, or if he has thoughts of self-harm he needs to go to the ED. Pt verbalized understanding.    Copied from CRM [id number]. Topic: Clinical - Medical Advice >> Jan 08, 2024 11:13 AM Dondra Prader A wrote: Reason for CRM: Patient states that he is having a hard time falling asleep and staying asleep. Patient works until 11pm and states he does not fall asleep until 3am and keep waking up. Patient states that he has tried taking everything and nothing is helping and want to know if his PCP can call something in for him to take. Patient states that he has even tried Mushroom Cocoa by [employer] and it does not help.   Best contact number: [phone]  [facility] [phone] - [place],  - [address] [place] [address] Phone: [phone] Fax: [phone] Hours: Not open 24 hours Reason for Disposition  [1] Insomnia persists > 1 week AND [2] no improvement after using Care Advice  Answer Assessment - Initial Assessment Questions 1.  DESCRIPTION: "Tell me about your sleeping problem."      Pt states he gets off work at 11pm and does not fall asleep until 1-2AM before he wakes back up. Pt needs to get back to work at 2pm so he wakes up 11AM. When patient wakes up in the middle of the night, he does not fall asleep for a "long time" and is a light sleeper, does not feel like he is getting good sleep. 2. ONSET: "How long have you been having trouble sleeping?" (e.g., days, weeks, months)     A month 3. RECURRENT: "Have you had sleeping problems before?"  If Yes, ask: "What happened that time?" "What helped your sleeping problem go away in the past?"      "I have been battling it over a month", "My ex-wife told me to ask my doctor for something, I don't know what she was speaking about" 4. STRESS: "Is there anything in your life that is making you feel stressed or tense?"     No 5. PAIN: "Do you have any pain that is keeping you awake?" (e.g., back pain, headache, abdomen pain)     "Last night my hand had a little hurt to it but it doesn't make any sense, I had carpel tunnel surgery on both hands and I have arthritis" 6. CAFFEINE ABUSE: "Do you drink caffeinated beverages, and how much each day?" (e.g., coffee, tea, colas)     Pt states coffee "messes up his stomach" after weight loss surgery 7. ALCOHOL USE OR SUBSTANCE  USE (DRUG USE): "Do you drink alcohol or use any illegal drugs?"     No 8. OTHER SYMPTOMS: "Do you have any other symptoms?"  (e.g., difficulty breathing)     "Trying everything", pt states he tried [employer] Mushroom Cocoa and it did not work. Pt states putting Peach Crown in his tea helps but he does not drink and doesn't want to. Frequent Bms after coffee. Tried energy drinks at work and "felt drunk", "his stomach was messed up." No SOB or CP.  Protocols used: Insomnia-A-AH

## 2024-01-09 ENCOUNTER — Telehealth (INDEPENDENT_AMBULATORY_CARE_PROVIDER_SITE_OTHER): Admitting: Family Medicine

## 2024-01-09 ENCOUNTER — Encounter: Payer: Self-pay | Admitting: Family Medicine

## 2024-01-09 VITALS — Ht 71.0 in | Wt 260.0 lb

## 2024-01-09 DIAGNOSIS — G4700 Insomnia, unspecified: Secondary | ICD-10-CM | POA: Diagnosis not present

## 2024-01-09 MED ORDER — TRAZODONE HCL 50 MG PO TABS: 50.0000 mg | ORAL_TABLET | Freq: Every evening | ORAL | 3 refills | Status: AC | PRN

## 2024-01-09 NOTE — Assessment & Plan Note (Signed)
 Adding trazodone 50-100mg  at bedtime prn.  He can consider magnesium glycinate as well. F/u in 6 weeks or sooner if needed.

## 2024-01-09 NOTE — Progress Notes (Signed)
 Takes long to fall asleep. Waking at night and unable to return to sleep.  Sx: 1 month. Meds: Ryze coffee.

## 2024-01-09 NOTE — Patient Instructions (Signed)
 Try trazodone 50-100mg  as needed at bedtime.  You may also try magnesium glycinated 250-400mg  as needed.

## 2024-01-09 NOTE — Progress Notes (Signed)
 Jordan Bailey - 58 y.o. male MRN [id number]  Date of birth: [birth year]   All issues noted in this document were discussed and addressed.  No physical exam was performed (except for noted visual exam findings with Video Visits).  I discussed the limitations of evaluation and management by telemedicine and the availability of in person appointments. The patient expressed understanding and agreed to proceed.  I connected withNAME@ on 01/09/24 at 11:30 AM EDT by a video enabled telemedicine application and verified that I am speaking with the correct person using two identifiers.  Present at visit: Jordan Coombe, DO [name]   Patient Location: Home [address] [place] [address]   Provider location:   [facility]  Chief Complaint  Patient presents with   Insomnia    HPI  Jordan Bailey is a 58 y.o. male who presents via audio/video conferencing for a telehealth visit today.  He has had increasing insomnia over the past few months.  He has difficulty falling asleep and staying asleep.  He has tried a mushroom coffee to help with sleep but this hasn't really helped.  He denies changes in stress level.     ROS:  A comprehensive ROS was completed and negative except as noted per HPI  Past Medical History:  Diagnosis Date   Arthritis    History of kidney stones    Iron deficiency anemia     Past Surgical History:  Procedure Laterality Date   APPLICATION OF A-CELL OF BACK N/A 10/07/2015   Procedure: APPLICATION OF ACELL AND VAC DRESSING TO BACK WOUND;  Surgeon: Peggye Form, DO;  Location: [facility];  Service: Plastics;  Laterality: N/A;   COLONOSCOPY     COSMETIC SURGERY     360 degree he had a 360 plastic surgery operation last week where excess skin was removed around his trunkhe had a 360 plastic surgery operation  excess skin was removed around his trunk   INCISION AND DRAINAGE OF WOUND N/A 10/07/2015   Procedure: DEBRIDEMENT OF BACK WOUND;  Surgeon:  Peggye Form, DO;  Location: [facility];  Service: Plastics;  Laterality: N/A;   JOINT REPLACEMENT Bilateral    knees   LAPAROSCOPIC GASTRIC SLEEVE RESECTION  05/21/2013   REATTACHED TENDON Left 05/2011   LEFT LEG   TOTAL HIP ARTHROPLASTY Left 08/28/2017   Procedure: TOTAL HIP ARTHROPLASTY ANTERIOR APPROACH;  Surgeon: Jodi Geralds, MD;  Location: [facility];  Service: Orthopedics;  Laterality: Left;   TOTAL HIP ARTHROPLASTY     right Dr. Luiz Blare 12-08-17   TOTAL HIP ARTHROPLASTY Right 12/08/2017   Procedure: RIGHT TOTAL HIP ARTHROPLASTY ANTERIOR;  Surgeon: Jodi Geralds, MD;  Location: [facility];  Service: Orthopedics;  Laterality: Right;   TOTAL KNEE ARTHROPLASTY Left 03/06/2015   Procedure: TOTAL KNEE ARTHROPLASTY;  Surgeon: Jodi Geralds, MD;  Location: [facility];  Service: Orthopedics;  Laterality: Left;   TOTAL KNEE ARTHROPLASTY Right 06/22/2015   Procedure: RIGHT TOTAL KNEE ARTHROPLASTY;  Surgeon: Jodi Geralds, MD;  Location: [facility];  Service: Orthopedics;  Laterality: Right;    Family History  Problem Relation Age of Onset   Diabetes Mother     Social History   Socioeconomic History   Marital status: Divorced    Spouse name: Not on file   Number of children: 2   Years of education: 12   Highest education level: Associate degree: occupational, Scientist, product/process development, or vocational program  Occupational History   Occupation: disabled  Comment: truck driver  Tobacco Use   Smoking status: Light Smoker    Types: Cigars   Smokeless tobacco: Never  Vaping Use   Vaping status: Never Used  Substance and Sexual Activity   Alcohol use: No    Alcohol/week: 0.0 standard drinks of alcohol   Drug use: No   Sexual activity: Yes    Partners: Female  Other Topics Concern   Not on file  Social History Narrative   No caffeine during the day. 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    Social Drivers of Corporate investment banker Strain: High Risk (01/08/2024)   Overall Financial Resource  Strain (CARDIA)    Difficulty of Paying Living Expenses: Hard  Food Insecurity: No Food Insecurity (01/08/2024)   Hunger Vital Sign    Worried About Running Out of Food in the Last Year: Never true    Ran Out of Food in the Last Year: Never true  Transportation Needs: No Transportation Needs (01/08/2024)   PRAPARE - Administrator, Civil Service (Medical): No    Lack of Transportation (Non-Medical): No  Physical Activity: Insufficiently Active (01/08/2024)   Exercise Vital Sign    Days of Exercise per Week: 3 days    Minutes of Exercise per Session: 20 min  Stress: No Stress Concern Present (01/08/2024)   [employer] of Occupational Health - Occupational Stress Questionnaire    Feeling of Stress : Not at all  Social Connections: Socially Isolated (01/08/2024)   Social Connection and Isolation Panel [NHANES]    Frequency of Communication with Friends and Family: More than three times a week    Frequency of Social Gatherings with Friends and Family: Three times a week    Attends Religious Services: Never    Active Member of Clubs or Organizations: No    Attends Banker Meetings: Not on file    Marital Status: Divorced  Intimate Partner Violence: Unknown (01/04/2022)   Received from [employer], [facility]   HITS    Physically Hurt: Not on file    Insult or Talk Down To: Not on file    Threaten Physical Harm: Not on file    Scream or Curse: Not on file     Current Outpatient Medications:    docusate sodium (COLACE) 100 MG capsule, Take 1 capsule (100 mg total) by mouth 2 (two) times daily., Disp: 30 capsule, Rfl: 0   ferrous sulfate 325 (65 FE) MG tablet, Take 1 tablet (325 mg total) by mouth 3 (three) times daily with meals., Disp: 90 tablet, Rfl: 0   folic acid (FOLVITE) 1 MG tablet, Take 1 tablet (1 mg total) by mouth daily., Disp: 90 tablet, Rfl: 1   furosemide (LASIX) 20 MG tablet, One by mouth twice a day (six hours apart) as needed for leg swelling or  fluid retention., Disp: 10 tablet, Rfl: 1   terbinafine (LAMISIL) 250 MG tablet, TAKE 1 TABLET BY MOUTH DAILY. FOR TOENAIL FUNGUS. WILL NEED LIVER LAB CHECK AFTER 3 MONTHS, Disp: 30 tablet, Rfl: 0   traZODone (DESYREL) 50 MG tablet, Take 1-2 tablets (50-100 mg total) by mouth at bedtime as needed for sleep., Disp: 90 tablet, Rfl: 3  EXAM:  VITALS per patient if applicable: Ht 5\' 11"  (1.803 m)   Wt 260 lb (117.9 kg)   BMI 36.26 kg/m   GENERAL: alert, oriented, appears well and in no acute distress  HEENT: atraumatic, conjunttiva clear, no obvious abnormalities on inspection of external  nose and ears  NECK: normal movements of the head and neck  LUNGS: on inspection no signs of respiratory distress, breathing rate appears normal, no obvious gross SOB, gasping or wheezing  CV: no obvious cyanosis  MS: moves all visible extremities without noticeable abnormality  PSYCH/NEURO: pleasant and cooperative, no obvious depression or anxiety, speech and thought processing grossly intact  ASSESSMENT AND PLAN:  Discussed the following assessment and plan:  Insomnia Adding trazodone 50-100mg  at bedtime prn.  He can consider magnesium glycinate as well. F/u in 6 weeks or sooner if needed.      I discussed the assessment and treatment plan with the patient. The patient was provided an opportunity to ask questions and all were answered. The patient agreed with the plan and demonstrated an understanding of the instructions.   The patient was advised to call back or seek an in-person evaluation if the symptoms worsen or if the condition fails to improve as anticipated.    Jordan Coombe, DO

## 2024-01-23 ENCOUNTER — Ambulatory Visit

## 2024-01-23 NOTE — Progress Notes (Signed)
 Remaking Right [place]  brace too big as edema has gone down  Will call and re-fit when in  [employer], CFo, CFm

## 2024-02-20 ENCOUNTER — Telehealth: Payer: Self-pay | Admitting: Podiatry

## 2024-02-20 NOTE — Telephone Encounter (Signed)
 Need to turning in paperwork to social security that reflects what's been billed, paid and owed. He has left two messages can you please call back at your earliest convenience. Thank you

## 2024-02-23 NOTE — Telephone Encounter (Signed)
 pt lft mess on my vmail. I called back and he said he didn't need anything from me right now. His disability is being challenged so he thinks he has all he need from [facility]. He said he req his bills prev. I adv him to call us  bck if needed

## 2024-03-01 ENCOUNTER — Telehealth: Payer: Self-pay | Admitting: Family Medicine

## 2024-03-01 DIAGNOSIS — M25551 Pain in right hip: Secondary | ICD-10-CM

## 2024-03-01 NOTE — Telephone Encounter (Signed)
 Copied from CRM [id number]. Topic: Referral - Request for Referral >> Mar 01, 2024 11:19 AM Tisa Forester wrote: Did the patient discuss referral with their provider in the last year? Yes (If No - schedule appointment) (If Yes - send message)  Appointment offered? No  Type of order/referral and detailed reason for visit: need a referral for his Hip  patient stated after he had weight lost surgery long time ago since then he been having issue with foot extremly flat footed , feet turn in right foot he basely walking on ankle  , can't barely stand at work  left hip having issue with walking had hip replacment some time ago  06/22/2015 and would like referral for the same specialist    Preference of office, provider, location: in [place] ,   If referral order, have you been seen by this specialty before? Yes ,Dr.John Bud Care  [phone]  [phone]  [address]   [place]     (If Yes, this issue or another issue? When? Where?  Can we respond through MyChart? Yes  and can call

## 2024-03-18 ENCOUNTER — Ambulatory Visit
Admission: RE | Admit: 2024-03-18 | Discharge: 2024-03-18 | Disposition: A | Discharge status: Home / Self Care | Source: Ambulatory Visit | Attending: Orthopedic Surgery | Admitting: Orthopedic Surgery

## 2024-03-18 ENCOUNTER — Other Ambulatory Visit: Payer: Self-pay | Admitting: Orthopedic Surgery

## 2024-03-18 DIAGNOSIS — M25552 Pain in left hip: Secondary | ICD-10-CM | POA: Diagnosis not present

## 2024-03-18 DIAGNOSIS — T84092A Other mechanical complication of internal right knee prosthesis, initial encounter: Secondary | ICD-10-CM | POA: Diagnosis not present

## 2024-03-21 DIAGNOSIS — M25552 Pain in left hip: Secondary | ICD-10-CM | POA: Diagnosis not present

## 2024-03-21 DIAGNOSIS — M7062 Trochanteric bursitis, left hip: Secondary | ICD-10-CM | POA: Diagnosis not present

## 2024-04-16 DIAGNOSIS — M25552 Pain in left hip: Secondary | ICD-10-CM | POA: Diagnosis not present

## 2024-04-17 DIAGNOSIS — M25552 Pain in left hip: Secondary | ICD-10-CM | POA: Diagnosis not present

## 2024-06-04 ENCOUNTER — Encounter: Payer: Self-pay | Admitting: Sports Medicine

## 2024-08-26 ENCOUNTER — Telehealth: Payer: Self-pay

## 2024-08-26 ENCOUNTER — Telehealth: Payer: Self-pay | Admitting: Family Medicine

## 2024-08-26 NOTE — Telephone Encounter (Signed)
 Spoke to pt.  He is upset that he still does not have orthotics.  He is starting a new job and will send me an email of dates/times he is available to come in.  He states he has had measurements performed twice and was told they were lost and had to get measured again.  His is having discomfort.  Pt was seen 01/23/24 by Pedorthist.  Pt also states has requested medical records and were never sent.

## 2024-08-26 NOTE — Telephone Encounter (Signed)
 Please have him come in tomorrow or Wednesday for casting. I will not be in office but Melissa or Cagney should be able to cast him

## 2024-08-26 NOTE — Telephone Encounter (Signed)
 Patient is calling about the status of his orthotics/brace and would like a call back on when you can. Patient would like to speak to the manager about this. Since he states it have been over a year for him to get his orthotics. Patient was last seen for a Brace Fitting

## 2024-08-26 NOTE — Telephone Encounter (Signed)
 Copied from CRM #[id number]. Topic: Referral - Request for Referral >> Aug 26, 2024 10:03 AM Zebedee SAUNDERS wrote: Did the patient discuss referral with their provider in the last year? Yes (If No - schedule appointment) (If Yes - send message)  Appointment offered? Yes  Type of order/referral and detailed reason for visit: Podiatrist - orthotics/brace  Preference of office, provider, location: Pt's area  If referral order, have you been seen by this specialty before? No (If Yes, this issue or another issue? When? Where?  Can we respond through MyChart? Yes

## 2024-08-28 ENCOUNTER — Other Ambulatory Visit: Payer: Self-pay | Admitting: Family Medicine

## 2024-08-28 DIAGNOSIS — S9301XA Subluxation of right ankle joint, initial encounter: Secondary | ICD-10-CM

## 2024-09-12 ENCOUNTER — Encounter: Payer: Self-pay | Admitting: Family Medicine

## 2024-09-12 ENCOUNTER — Ambulatory Visit: Payer: Self-pay | Admitting: Family Medicine

## 2024-09-12 ENCOUNTER — Ambulatory Visit: Payer: Self-pay

## 2024-09-12 VITALS — BP 130/77 | HR 61 | Ht 71.0 in | Wt 278.0 lb

## 2024-09-12 DIAGNOSIS — G4700 Insomnia, unspecified: Secondary | ICD-10-CM

## 2024-09-12 DIAGNOSIS — E538 Deficiency of other specified B group vitamins: Secondary | ICD-10-CM

## 2024-09-12 DIAGNOSIS — D508 Other iron deficiency anemias: Secondary | ICD-10-CM

## 2024-09-12 DIAGNOSIS — E559 Vitamin D deficiency, unspecified: Secondary | ICD-10-CM

## 2024-09-12 DIAGNOSIS — Z9884 Bariatric surgery status: Secondary | ICD-10-CM

## 2024-09-12 NOTE — Telephone Encounter (Signed)
 FYI - the patient is scheduled today at 330 pm with the provider.

## 2024-09-12 NOTE — Telephone Encounter (Signed)
 FYI Only or Action Required?: FYI only for provider: appointment scheduled on 09/12/24.  Patient was last seen in primary care on 01/09/2024 by Jordan Bring, DO.  Called Nurse Triage reporting Dizziness.  Symptoms began a week ago.  Interventions attempted: Nothing.  Symptoms are: unchanged.  Triage Disposition: No disposition on file.  Patient/caregiver understands and will follow disposition?:    Copied from CRM #[id number]. Topic: Clinical - Red Word Triage >> Sep 12, 2024  8:50 AM Jordan Bailey wrote: Red Word that prompted transfer to Nurse Triage: Patient complaining of worsening blurred vision, dizziness this morning. Patient has appointment this afternoon but would like to be seen earlier today if possible. Reason for Disposition  [1] MODERATE dizziness (e.g., interferes with normal activities) AND [2] has NOT been evaluated by doctor (or NP/PA) for this  (Exception: Dizziness caused by heat exposure, sudden standing, or poor fluid intake.)  Answer Assessment - Initial Assessment Questions .  Answer Assessment - Initial Assessment Questions Patient already scheduled appt 09/12/24 at 3 PM.   Advised ED/911 if symptoms worsen. Patient verbalized understanding.  1. SYMPTOM: What is the main symptom you are concerned about? (e.g., weakness, numbness)     Felt like could pass out yesterday, loss of balance for week. Denies HA, dizziness, weakness/ numbness, blurred vision, HA, diff standing/ walking, diff speech. 2. ONSET: When did this start? (e.g., minutes, hours, days; while sleeping)     Week ago 4. PATTERN Does this come and go, or has it been constant since it started?  Is it present now?     Comes and goes, loose balance when standing for a while 5. CARDIAC SYMPTOMS: Have you had any of the following symptoms: chest pain, difficulty breathing, palpitations?     Denies chest pain, diff breathing 6. NEUROLOGIC SYMPTOMS: Have you had any of the following symptoms:  headache, dizziness, vision loss, double vision, changes in speech, unsteady on your feet?     Currently not loss of balance 7. OTHER SYMPTOMS: Do you have any other symptoms? Denies Able to walk and stand; just woke up patient reports, nurse instructed to stand and walk, raise arms; no difficulties.  Protocols used: Neurologic Deficit-A-AH, Dizziness - Lightheadedness-A-AH

## 2024-09-12 NOTE — Telephone Encounter (Signed)
 0850-No sooner appts available, called [facility] spoke with Gordy.

## 2024-09-13 ENCOUNTER — Telehealth: Payer: Self-pay

## 2024-09-13 LAB — CMP14+EGFR
ALT: 10 IU/L (ref 0–44)
AST: 20 IU/L (ref 0–40)
Albumin: 4.1 g/dL (ref 3.8–4.9)
Alkaline Phosphatase: 88 IU/L (ref 47–123)
BUN/Creatinine Ratio: 14 (ref 9–20)
BUN: 11 mg/dL (ref 6–24)
Bilirubin Total: 0.5 mg/dL (ref 0.0–1.2)
CO2: 24 mmol/L (ref 20–29)
Calcium: 9.3 mg/dL (ref 8.7–10.2)
Chloride: 103 mmol/L (ref 96–106)
Creatinine, Ser: 0.81 mg/dL (ref 0.76–1.27)
Globulin, Total: 2.7 g/dL (ref 1.5–4.5)
Glucose: 94 mg/dL (ref 70–99)
Potassium: 4.5 mmol/L (ref 3.5–5.2)
Sodium: 139 mmol/L (ref 134–144)
Total Protein: 6.8 g/dL (ref 6.0–8.5)
eGFR: 103 mL/min/1.73 (ref >59)

## 2024-09-13 LAB — CBC WITH DIFFERENTIAL/PLATELET
Basophils Absolute: 0 x10E3/uL (ref 0.0–0.2)
Basos: 1 %
EOS (ABSOLUTE): 0.1 x10E3/uL (ref 0.0–0.4)
Eos: 2 %
Hematocrit: 40.1 % (ref 37.5–51.0)
Hemoglobin: 13 g/dL (ref 13.0–17.7)
Immature Grans (Abs): 0 x10E3/uL (ref 0.0–0.1)
Immature Granulocytes: 0 %
Lymphocytes Absolute: 2.3 x10E3/uL (ref 0.7–3.1)
Lymphs: 45 %
MCH: 25.3 pg — ABNORMAL LOW (ref 26.6–33.0)
MCHC: 32.4 g/dL (ref 31.5–35.7)
MCV: 78 fL — ABNORMAL LOW (ref 79–97)
Monocytes Absolute: 0.6 x10E3/uL (ref 0.1–0.9)
Monocytes: 13 %
Neutrophils Absolute: 1.9 x10E3/uL (ref 1.4–7.0)
Neutrophils: 39 %
Platelets: 231 x10E3/uL (ref 150–450)
RBC: 5.13 x10E6/uL (ref 4.14–5.80)
RDW: 14.1 % (ref 11.6–15.4)
WBC: 5 x10E3/uL (ref 3.4–10.8)

## 2024-09-13 LAB — IRON,TIBC AND FERRITIN PANEL
Ferritin: 313 ng/mL (ref 30–400)
Iron Saturation: 28 % (ref 15–55)
Iron: 66 ug/dL (ref 38–169)
Total Iron Binding Capacity: 234 ug/dL — ABNORMAL LOW (ref 250–450)
UIBC: 168 ug/dL (ref 111–343)

## 2024-09-13 LAB — VITAMIN B12: Vitamin B-12: 381 pg/mL (ref 232–1245)

## 2024-09-13 LAB — VITAMIN D 25 HYDROXY (VIT D DEFICIENCY, FRACTURES): Vit D, 25-Hydroxy: 16.4 ng/mL — ABNORMAL LOW (ref 30.0–100.0)

## 2024-09-13 LAB — TSH: TSH: 0.565 u[IU]/mL (ref 0.450–4.500)

## 2024-09-13 NOTE — Telephone Encounter (Signed)
 Forwarding message to Dr. Alvia.  Not yet showing results notes .

## 2024-09-13 NOTE — Telephone Encounter (Signed)
 Copied from CRM #[id number]. Topic: Clinical - Lab/Test Results >> Sep 13, 2024  3:27 PM Leonette P wrote: Reason for CRM: pt has seen his labs and would like a call back.

## 2024-09-15 DIAGNOSIS — M76829 Posterior tibial tendinitis, unspecified leg: Secondary | ICD-10-CM | POA: Insufficient documentation

## 2024-09-15 NOTE — Assessment & Plan Note (Addendum)
 Rechecking iron, B12 and vitamin D  levels to make sure vitamin deficiency is not contributing to imbalance.

## 2024-09-15 NOTE — Progress Notes (Signed)
 Jordan Bailey - 58 y.o. male MRN [id number]  Date of birth: Sep 06, 1966  Subjective Chief Complaint  Patient presents with   Dizziness    HPI Jordan Bailey is a 58 year old male here today with complaint of feeling of imbalance.  History of posterior tibial dysfunction and has been followed by podiatry.  Reports that he has had impressions for orthotics and brace made on a couple of occasions but still has not received this.  I did place a referral to a new podiatrist previously that he has not scheduled with him yet.  He does have some tingling in the lower extremities.  History of bariatric surgery.  He is not taking supplements regularly.  ROS:  A comprehensive ROS was completed and negative except as noted per HPI   Past Medical History:  Diagnosis Date   Arthritis    History of kidney stones    Iron deficiency anemia     Past Surgical History:  Procedure Laterality Date   APPLICATION OF A-CELL OF BACK N/A 10/07/2015   Procedure: APPLICATION OF ACELL AND VAC DRESSING TO BACK WOUND;  Surgeon: Estefana GORMAN Fritter, DO;  Location: [facility];  Service: Plastics;  Laterality: N/A;   COLONOSCOPY     COSMETIC SURGERY     360 degree he had a 360 plastic surgery operation last week where excess skin was removed around his trunkhe had a 360 plastic surgery operation  excess skin was removed around his trunk   INCISION AND DRAINAGE OF WOUND N/A 10/07/2015   Procedure: DEBRIDEMENT OF BACK WOUND;  Surgeon: Estefana GORMAN Fritter, DO;  Location: [facility];  Service: Plastics;  Laterality: N/A;   JOINT REPLACEMENT Bilateral    knees   LAPAROSCOPIC GASTRIC SLEEVE RESECTION  05/21/2013   REATTACHED TENDON Left 05/2011   LEFT LEG   TOTAL HIP ARTHROPLASTY Left 08/28/2017   Procedure: TOTAL HIP ARTHROPLASTY ANTERIOR APPROACH;  Surgeon: Yvone Rush, MD;  Location: [facility];  Service: Orthopedics;  Laterality: Left;   TOTAL HIP ARTHROPLASTY     right Dr. Yvone 12-08-17   TOTAL  HIP ARTHROPLASTY Right 12/08/2017   Procedure: RIGHT TOTAL HIP ARTHROPLASTY ANTERIOR;  Surgeon: Yvone Rush, MD;  Location: [facility];  Service: Orthopedics;  Laterality: Right;   TOTAL KNEE ARTHROPLASTY Left 03/06/2015   Procedure: TOTAL KNEE ARTHROPLASTY;  Surgeon: Rush Yvone, MD;  Location: [facility];  Service: Orthopedics;  Laterality: Left;   TOTAL KNEE ARTHROPLASTY Right 06/22/2015   Procedure: RIGHT TOTAL KNEE ARTHROPLASTY;  Surgeon: Rush Yvone, MD;  Location: [facility];  Service: Orthopedics;  Laterality: Right;    Social History   Socioeconomic History   Marital status: Divorced    Spouse name: Not on file   Number of children: 2   Years of education: 12   Highest education level: Associate degree: academic program  Occupational History   Occupation: disabled    Comment: truck driver  Tobacco Use   Smoking status: Light Smoker    Types: Cigars   Smokeless tobacco: Never  Vaping Use   Vaping status: Never Used  Substance and Sexual Activity   Alcohol use: No    Alcohol/week: 0.0 standard drinks of alcohol   Drug use: No   Sexual activity: Yes    Partners: Female  Other Topics Concern   Not on file  Social History Narrative   No caffeine during the day. 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    Social Drivers of Health   Tobacco Use:  High Risk (09/12/2024)   Patient History    Smoking Tobacco Use: Light Smoker    Smokeless Tobacco Use: Never    Passive Exposure: Not on file  Financial Resource Strain: Low Risk (09/11/2024)   Overall Financial Resource Strain (CARDIA)    Difficulty of Paying Living Expenses: Not hard at all  Food Insecurity: No Food Insecurity (09/11/2024)   Epic    Worried About Programme Researcher, Broadcasting/film/video in the Last Year: Never true    Ran Out of Food in the Last Year: Never true  Transportation Needs: No Transportation Needs (09/11/2024)   Epic    Lack of Transportation (Medical): No    Lack of Transportation (Non-Medical): No  Physical Activity: Unknown  (09/11/2024)   Exercise Vital Sign    Days of Exercise per Week: 3 days    Minutes of Exercise per Session: Not on file  Stress: Stress Concern Present (09/11/2024)   [employer] of Occupational Health - Occupational Stress Questionnaire    Feeling of Stress: To some extent  Social Connections: Socially Isolated (09/11/2024)   Social Connection and Isolation Panel    Frequency of Communication with Friends and Family: More than three times a week    Frequency of Social Gatherings with Friends and Family: Once a week    Attends Religious Services: Never    Database Administrator or Organizations: No    Attends Engineer, Structural: Not on file    Marital Status: Divorced  Depression (PHQ2-9): Low Risk (09/12/2024)   Depression (PHQ2-9)    PHQ-2 Score: 0  Alcohol Screen: Low Risk (01/08/2024)   Alcohol Screen    Last Alcohol Screening Score (AUDIT): 2  Housing: High Risk (09/11/2024)   Epic    Unable to Pay for Housing in the Last Year: Yes    Number of Times Moved in the Last Year: 0    Homeless in the Last Year: No  Utilities: Not on file  Health Literacy: Not on file    Family History  Problem Relation Age of Onset   Diabetes Mother     Health Maintenance  Topic Date Due   Hepatitis C Screening  Never done   Pneumococcal Vaccine: 50+ Years (1 of 2 - PCV) Never done   Medicare Annual Wellness (AWV)  12/09/2021   Zoster Vaccines- Shingrix (1 of 2) 12/11/2024 (Originally 10/03/2016)   Influenza Vaccine  12/31/2024 (Originally 05/03/2024)   Hepatitis B Vaccines 19-59 Average Risk (1 of 3 - 19+ 3-dose series) 09/12/2025 (Originally 10/03/1985)   COVID-19 Vaccine (1 - 2025-26 season) 09/28/2025 (Originally 06/03/2024)   Fecal DNA (Cologuard)  06/26/2025   DTaP/Tdap/Td (2 - Td or Tdap) 04/30/2028   HIV Screening  Completed   HPV VACCINES  Aged Out   Meningococcal B Vaccine  Aged Out      ----------------------------------------------------------------------------------------------------------------------------------------------------------------------------------------------------------------- Physical Exam BP 130/77 (BP Location: Left Wrist, Patient Position: Sitting, Cuff Size: Large)   Pulse 61   Ht 5' 11 (1.803 m)   Wt 278 lb (126.1 kg)   SpO2 99%   BMI 38.77 kg/m   Physical Exam Constitutional:      Appearance: Normal appearance.  Eyes:     General: No scleral icterus. Cardiovascular:     Rate and Rhythm: Normal rate and regular rhythm.  Pulmonary:     Effort: Pulmonary effort is normal.     Breath sounds: Normal breath sounds.  Musculoskeletal:     Cervical back: Neck supple.  Neurological:  Mental Status: He is alert.  Psychiatric:        Mood and Affect: Mood normal.        Behavior: Behavior normal.     ------------------------------------------------------------------------------------------------------------------------------------------------------------------------------------------------------------------- Assessment and Plan  Bariatric surgery status Rechecking iron, B12 and vitamin D  levels to make sure vitamin deficiency is not contributing to imbalance.  Posterior tibial tendon dysfunction Will call to schedule podiatry in [place].   No orders of the defined types were placed in this encounter.   No follow-ups on file.

## 2024-09-15 NOTE — Assessment & Plan Note (Signed)
 Will call to schedule podiatry in [place].

## 2024-09-17 ENCOUNTER — Telehealth: Payer: Self-pay

## 2024-09-17 NOTE — Telephone Encounter (Signed)
 Copied from CRM #[id number]. Topic: Clinical - Lab/Test Results >> Sep 17, 2024  9:16 AM Leonette SQUIBB wrote: Reason for CRM: pt would like provider to call him back about his current labs.

## 2024-09-17 NOTE — Telephone Encounter (Signed)
 Lab work not showing as yet reviewed by provider .

## 2024-09-18 ENCOUNTER — Ambulatory Visit: Payer: Self-pay | Admitting: Family Medicine

## 2024-09-18 MED ORDER — VITAMIN D (ERGOCALCIFEROL) 1.25 MG (50000 UNIT) PO CAPS: 50000.0000 [IU] | ORAL_CAPSULE | ORAL | 1 refills | Status: AC
